# Patient Record
Sex: Female | Born: 1950
Health system: Southern US, Community
[De-identification: ages and names within clinical notes are randomized; demographics above are authoritative.]

## PROBLEM LIST (undated history)

## (undated) DIAGNOSIS — C44721 Squamous cell carcinoma of skin of unspecified lower limb, including hip: Secondary | ICD-10-CM

## (undated) DIAGNOSIS — E119 Type 2 diabetes mellitus without complications: Secondary | ICD-10-CM

## (undated) DIAGNOSIS — R748 Abnormal levels of other serum enzymes: Secondary | ICD-10-CM

## (undated) DIAGNOSIS — I10 Essential (primary) hypertension: Secondary | ICD-10-CM

## (undated) DIAGNOSIS — Z91018 Allergy to other foods: Secondary | ICD-10-CM

## (undated) HISTORY — DX: Allergy to other foods: Z91.018

## (undated) HISTORY — DX: Squamous cell carcinoma of skin of unspecified lower limb, including hip: C44.721

## (undated) HISTORY — PX: BACK SURGERY: SHX140

## (undated) HISTORY — DX: Type 2 diabetes mellitus without complications: E11.9

## (undated) HISTORY — PX: SURGERY OF LIP: SUR1315

## (undated) HISTORY — DX: Essential (primary) hypertension: I10

## (undated) HISTORY — DX: Abnormal levels of other serum enzymes: R74.8

---

## 2001-05-15 ENCOUNTER — Other Ambulatory Visit: Admission: RE | Admit: 2001-05-15 | Discharge: 2001-05-15 | Payer: Self-pay | Admitting: Dermatology

## 2015-11-11 DIAGNOSIS — E1165 Type 2 diabetes mellitus with hyperglycemia: Secondary | ICD-10-CM | POA: Diagnosis not present

## 2015-11-11 DIAGNOSIS — Z6832 Body mass index (BMI) 32.0-32.9, adult: Secondary | ICD-10-CM | POA: Diagnosis not present

## 2015-11-11 DIAGNOSIS — I10 Essential (primary) hypertension: Secondary | ICD-10-CM | POA: Diagnosis not present

## 2016-02-17 DIAGNOSIS — Z1231 Encounter for screening mammogram for malignant neoplasm of breast: Secondary | ICD-10-CM | POA: Diagnosis not present

## 2016-02-17 DIAGNOSIS — Z803 Family history of malignant neoplasm of breast: Secondary | ICD-10-CM | POA: Diagnosis not present

## 2016-02-27 DIAGNOSIS — Z23 Encounter for immunization: Secondary | ICD-10-CM | POA: Diagnosis not present

## 2016-04-13 DIAGNOSIS — E7800 Pure hypercholesterolemia, unspecified: Secondary | ICD-10-CM | POA: Diagnosis not present

## 2016-04-13 DIAGNOSIS — R945 Abnormal results of liver function studies: Secondary | ICD-10-CM | POA: Diagnosis not present

## 2016-04-13 DIAGNOSIS — E1165 Type 2 diabetes mellitus with hyperglycemia: Secondary | ICD-10-CM | POA: Diagnosis not present

## 2016-04-13 DIAGNOSIS — I10 Essential (primary) hypertension: Secondary | ICD-10-CM | POA: Diagnosis not present

## 2016-04-18 DIAGNOSIS — I10 Essential (primary) hypertension: Secondary | ICD-10-CM | POA: Diagnosis not present

## 2016-04-18 DIAGNOSIS — G4709 Other insomnia: Secondary | ICD-10-CM | POA: Diagnosis not present

## 2016-04-18 DIAGNOSIS — E1165 Type 2 diabetes mellitus with hyperglycemia: Secondary | ICD-10-CM | POA: Diagnosis not present

## 2016-04-18 DIAGNOSIS — Z6833 Body mass index (BMI) 33.0-33.9, adult: Secondary | ICD-10-CM | POA: Diagnosis not present

## 2016-04-18 DIAGNOSIS — E7800 Pure hypercholesterolemia, unspecified: Secondary | ICD-10-CM | POA: Diagnosis not present

## 2016-04-30 ENCOUNTER — Encounter (INDEPENDENT_AMBULATORY_CARE_PROVIDER_SITE_OTHER): Payer: Self-pay | Admitting: Internal Medicine

## 2016-05-08 ENCOUNTER — Other Ambulatory Visit (INDEPENDENT_AMBULATORY_CARE_PROVIDER_SITE_OTHER): Payer: Self-pay | Admitting: *Deleted

## 2016-05-08 ENCOUNTER — Encounter (INDEPENDENT_AMBULATORY_CARE_PROVIDER_SITE_OTHER): Payer: Self-pay

## 2016-05-08 ENCOUNTER — Ambulatory Visit (INDEPENDENT_AMBULATORY_CARE_PROVIDER_SITE_OTHER): Payer: Medicare Other | Admitting: Internal Medicine

## 2016-05-08 ENCOUNTER — Encounter (INDEPENDENT_AMBULATORY_CARE_PROVIDER_SITE_OTHER): Payer: Self-pay | Admitting: Internal Medicine

## 2016-05-08 VITALS — BP 150/80 | HR 56 | Temp 98.1°F | Ht 63.5 in | Wt 188.0 lb

## 2016-05-08 DIAGNOSIS — K760 Fatty (change of) liver, not elsewhere classified: Secondary | ICD-10-CM | POA: Diagnosis not present

## 2016-05-08 DIAGNOSIS — R748 Abnormal levels of other serum enzymes: Secondary | ICD-10-CM | POA: Diagnosis not present

## 2016-05-08 DIAGNOSIS — I10 Essential (primary) hypertension: Secondary | ICD-10-CM

## 2016-05-08 DIAGNOSIS — E119 Type 2 diabetes mellitus without complications: Secondary | ICD-10-CM

## 2016-05-08 HISTORY — DX: Essential (primary) hypertension: I10

## 2016-05-08 HISTORY — DX: Type 2 diabetes mellitus without complications: E11.9

## 2016-05-08 NOTE — Patient Instructions (Signed)
OV in 6 months. 

## 2016-05-08 NOTE — Progress Notes (Signed)
   Subjective:    Patient ID: Dorothy Robbins, female    DOB: 09-17-1950, 65 y.o.   MRN: [id number]  HPI Referred by Dr. Nadara Mustard for elevated liver enzymes. She thinks they have been elevated since 2014. No weight loss. She has weight gain Appetite is good. No IV drugs. No tattoos. No blood transfusion.   Hx of pancreatic cancer in a sister with mets to the liver and died at age 18 Mother had fatty liver. She has a niece with elevated liver enzymes. She says father and sister had hepatitis ?   04/13/2016 H and H 14.1 and 42.9 ALP 89 AST 112 ALT 216  04/25/2015 [place] complete (elevated liver enzymes) No Gall stones. Normal CBD. Mild increase echogenicity of the liver suspicious for fatty infiltration. No focal hepatic mass.   Review of Systems Past Medical History:  Diagnosis Date  . Diabetes ([place]) 05/08/2016  . Essential hypertension 05/08/2016    No past surgical history on file.  Allergies  Allergen Reactions  . Sulfa Antibiotics     rash    No current outpatient prescriptions on file prior to visit.   No current facility-administered medications on file prior to visit.        Objective:   Physical Exam Blood pressure (!) 150/80, pulse (!) 56, temperature 98.1 F (36.7 C), height 5' 3.5" (1.613 m), weight 188 lb (85.3 kg). Alert and oriented. Skin warm and dry. Oral mucosa is moist.   . Sclera anicteric, conjunctivae is pink. Thyroid not enlarged. No cervical lymphadenopathy. Lungs clear. Heart regular rate and rhythm.  Abdomen is soft. Bowel sounds are positive         Assessment & Plan:  Elevated liver enzymes/fatty liver. Hepatitis panel, hepatic function, alpha 1 antitrypsin, ceruloplasmin, SMA, ANA, Will need [place] elast once I get all labs back.  OV in 6 months

## 2016-05-09 LAB — HEPATIC FUNCTION PANEL
ALBUMIN: 4.4 g/dL (ref 3.6–5.1)
ALK PHOS: 50 U/L (ref 33–130)
ALT: 165 U/L — AB (ref 6–29)
AST: 101 U/L — AB (ref 10–35)
BILIRUBIN TOTAL: 0.3 mg/dL (ref 0.2–1.2)
Bilirubin, Direct: 0.1 mg/dL (ref <=0.2)
Indirect Bilirubin: 0.2 mg/dL (ref 0.2–1.2)
TOTAL PROTEIN: 7 g/dL (ref 6.1–8.1)

## 2016-05-09 LAB — HEPATITIS PANEL, ACUTE
HCV AB: NEGATIVE
HEP B C IGM: NONREACTIVE
Hep A IgM: NONREACTIVE
Hepatitis B Surface Ag: NEGATIVE

## 2016-05-09 LAB — ANTI-NUCLEAR AB-TITER (ANA TITER)

## 2016-05-09 LAB — SEDIMENTATION RATE: Sed Rate: 4 mm/hr (ref 0–30)

## 2016-05-09 LAB — ANA: Anti Nuclear Antibody(ANA): POSITIVE — AB

## 2016-05-10 ENCOUNTER — Encounter (INDEPENDENT_AMBULATORY_CARE_PROVIDER_SITE_OTHER): Payer: Self-pay

## 2016-05-10 LAB — ALPHA-1-ANTITRYPSIN: A1 ANTITRYPSIN SER: 155 mg/dL (ref 83–199)

## 2016-05-10 LAB — ANTI-SMOOTH MUSCLE ANTIBODY, IGG: Smooth Muscle Ab: 20 U — ABNORMAL HIGH (ref <20)

## 2016-05-10 LAB — CERULOPLASMIN: CERULOPLASMIN: 27 mg/dL (ref 18–53)

## 2016-05-24 ENCOUNTER — Telehealth (INDEPENDENT_AMBULATORY_CARE_PROVIDER_SITE_OTHER): Payer: Self-pay | Admitting: Internal Medicine

## 2016-05-24 ENCOUNTER — Other Ambulatory Visit (INDEPENDENT_AMBULATORY_CARE_PROVIDER_SITE_OTHER): Payer: Self-pay | Admitting: Internal Medicine

## 2016-05-24 DIAGNOSIS — R748 Abnormal levels of other serum enzymes: Secondary | ICD-10-CM

## 2016-05-24 NOTE — Telephone Encounter (Signed)
[place] sch'd 05/30/16 at 10:00 (945), npo after midnight, patient aware

## 2016-05-24 NOTE — Telephone Encounter (Signed)
Dorothy Robbins, US abdomen.  I have spoken with patient

## 2016-05-24 NOTE — Telephone Encounter (Signed)
Dorothy Robbins, US abdomen 

## 2016-05-25 ENCOUNTER — Encounter (INDEPENDENT_AMBULATORY_CARE_PROVIDER_SITE_OTHER): Payer: Self-pay

## 2016-05-28 DIAGNOSIS — R748 Abnormal levels of other serum enzymes: Secondary | ICD-10-CM | POA: Diagnosis not present

## 2016-05-28 LAB — HEPATIC FUNCTION PANEL
ALBUMIN: 4.1 g/dL (ref 3.6–5.1)
ALK PHOS: 56 U/L (ref 33–130)
ALT: 151 U/L — AB (ref 6–29)
AST: 71 U/L — AB (ref 10–35)
BILIRUBIN TOTAL: 0.4 mg/dL (ref 0.2–1.2)
Bilirubin, Direct: 0.1 mg/dL (ref <=0.2)
Indirect Bilirubin: 0.3 mg/dL (ref 0.2–1.2)
Total Protein: 7 g/dL (ref 6.1–8.1)

## 2016-05-29 LAB — IGG: IGG (IMMUNOGLOBIN G), SERUM: 1098 mg/dL (ref 694–1618)

## 2016-05-30 ENCOUNTER — Ambulatory Visit (HOSPITAL_COMMUNITY)
Admission: RE | Admit: 2016-05-30 | Discharge: 2016-05-30 | Disposition: A | Discharge status: Home / Self Care | Payer: Medicare Other | Source: Ambulatory Visit | Attending: Internal Medicine | Admitting: Internal Medicine

## 2016-05-30 DIAGNOSIS — R748 Abnormal levels of other serum enzymes: Secondary | ICD-10-CM | POA: Diagnosis not present

## 2016-05-30 DIAGNOSIS — R945 Abnormal results of liver function studies: Secondary | ICD-10-CM | POA: Diagnosis not present

## 2016-06-04 ENCOUNTER — Other Ambulatory Visit (INDEPENDENT_AMBULATORY_CARE_PROVIDER_SITE_OTHER): Payer: Self-pay | Admitting: *Deleted

## 2016-06-04 DIAGNOSIS — R748 Abnormal levels of other serum enzymes: Secondary | ICD-10-CM

## 2016-06-04 NOTE — Telephone Encounter (Signed)
Patient already has an appointment scheduled for 11/06/16 at 9:00am with Deberah Castle, NP.

## 2016-07-04 DIAGNOSIS — L570 Actinic keratosis: Secondary | ICD-10-CM | POA: Diagnosis not present

## 2016-07-04 DIAGNOSIS — D485 Neoplasm of uncertain behavior of skin: Secondary | ICD-10-CM | POA: Diagnosis not present

## 2016-07-04 DIAGNOSIS — Z85828 Personal history of other malignant neoplasm of skin: Secondary | ICD-10-CM | POA: Diagnosis not present

## 2016-07-04 DIAGNOSIS — D225 Melanocytic nevi of trunk: Secondary | ICD-10-CM | POA: Diagnosis not present

## 2016-07-04 DIAGNOSIS — D0462 Carcinoma in situ of skin of left upper limb, including shoulder: Secondary | ICD-10-CM | POA: Diagnosis not present

## 2016-07-24 DIAGNOSIS — D0462 Carcinoma in situ of skin of left upper limb, including shoulder: Secondary | ICD-10-CM | POA: Diagnosis not present

## 2016-08-09 DIAGNOSIS — L244 Irritant contact dermatitis due to drugs in contact with skin: Secondary | ICD-10-CM | POA: Diagnosis not present

## 2016-08-13 ENCOUNTER — Encounter (INDEPENDENT_AMBULATORY_CARE_PROVIDER_SITE_OTHER): Payer: Self-pay | Admitting: *Deleted

## 2016-08-13 ENCOUNTER — Other Ambulatory Visit (INDEPENDENT_AMBULATORY_CARE_PROVIDER_SITE_OTHER): Payer: Self-pay | Admitting: *Deleted

## 2016-08-13 DIAGNOSIS — R748 Abnormal levels of other serum enzymes: Secondary | ICD-10-CM

## 2016-09-03 DIAGNOSIS — R748 Abnormal levels of other serum enzymes: Secondary | ICD-10-CM | POA: Diagnosis not present

## 2016-09-04 LAB — HEPATIC FUNCTION PANEL
ALBUMIN: 4.5 g/dL (ref 3.6–5.1)
ALT: 127 U/L — ABNORMAL HIGH (ref 6–29)
AST: 79 U/L — ABNORMAL HIGH (ref 10–35)
Alkaline Phosphatase: 45 U/L (ref 33–130)
BILIRUBIN DIRECT: 0.1 mg/dL (ref <=0.2)
BILIRUBIN TOTAL: 0.4 mg/dL (ref 0.2–1.2)
Indirect Bilirubin: 0.3 mg/dL (ref 0.2–1.2)
Total Protein: 7.1 g/dL (ref 6.1–8.1)

## 2016-09-11 DIAGNOSIS — E1165 Type 2 diabetes mellitus with hyperglycemia: Secondary | ICD-10-CM | POA: Diagnosis not present

## 2016-09-11 DIAGNOSIS — I10 Essential (primary) hypertension: Secondary | ICD-10-CM | POA: Diagnosis not present

## 2016-09-11 DIAGNOSIS — R945 Abnormal results of liver function studies: Secondary | ICD-10-CM | POA: Diagnosis not present

## 2016-09-11 DIAGNOSIS — E7800 Pure hypercholesterolemia, unspecified: Secondary | ICD-10-CM | POA: Diagnosis not present

## 2016-09-12 ENCOUNTER — Other Ambulatory Visit (INDEPENDENT_AMBULATORY_CARE_PROVIDER_SITE_OTHER): Payer: Self-pay | Admitting: *Deleted

## 2016-09-12 DIAGNOSIS — R7401 Elevation of levels of liver transaminase levels: Secondary | ICD-10-CM

## 2016-09-12 DIAGNOSIS — R740 Nonspecific elevation of levels of transaminase and lactic acid dehydrogenase [LDH]: Principal | ICD-10-CM

## 2016-09-14 ENCOUNTER — Encounter (INDEPENDENT_AMBULATORY_CARE_PROVIDER_SITE_OTHER): Payer: Self-pay | Admitting: *Deleted

## 2016-09-14 ENCOUNTER — Other Ambulatory Visit (INDEPENDENT_AMBULATORY_CARE_PROVIDER_SITE_OTHER): Payer: Self-pay | Admitting: *Deleted

## 2016-09-14 DIAGNOSIS — R7401 Elevation of levels of liver transaminase levels: Secondary | ICD-10-CM

## 2016-09-14 DIAGNOSIS — E7800 Pure hypercholesterolemia, unspecified: Secondary | ICD-10-CM | POA: Diagnosis not present

## 2016-09-14 DIAGNOSIS — R740 Nonspecific elevation of levels of transaminase and lactic acid dehydrogenase [LDH]: Principal | ICD-10-CM

## 2016-09-14 DIAGNOSIS — E1165 Type 2 diabetes mellitus with hyperglycemia: Secondary | ICD-10-CM | POA: Diagnosis not present

## 2016-09-14 DIAGNOSIS — Z0000 Encounter for general adult medical examination without abnormal findings: Secondary | ICD-10-CM | POA: Diagnosis not present

## 2016-09-14 DIAGNOSIS — I10 Essential (primary) hypertension: Secondary | ICD-10-CM | POA: Diagnosis not present

## 2016-10-02 DIAGNOSIS — L570 Actinic keratosis: Secondary | ICD-10-CM | POA: Diagnosis not present

## 2016-10-22 DIAGNOSIS — R740 Nonspecific elevation of levels of transaminase and lactic acid dehydrogenase [LDH]: Secondary | ICD-10-CM | POA: Diagnosis not present

## 2016-10-22 LAB — HEPATIC FUNCTION PANEL
ALBUMIN: 4.5 g/dL (ref 3.6–5.1)
ALT: 50 U/L — ABNORMAL HIGH (ref 6–29)
AST: 38 U/L — ABNORMAL HIGH (ref 10–35)
Alkaline Phosphatase: 48 U/L (ref 33–130)
BILIRUBIN DIRECT: 0.1 mg/dL (ref <=0.2)
BILIRUBIN TOTAL: 0.5 mg/dL (ref 0.2–1.2)
Indirect Bilirubin: 0.4 mg/dL (ref 0.2–1.2)
Total Protein: 7.3 g/dL (ref 6.1–8.1)

## 2016-10-25 DIAGNOSIS — Z01419 Encounter for gynecological examination (general) (routine) without abnormal findings: Secondary | ICD-10-CM | POA: Diagnosis not present

## 2016-10-25 DIAGNOSIS — Z6829 Body mass index (BMI) 29.0-29.9, adult: Secondary | ICD-10-CM | POA: Diagnosis not present

## 2016-10-25 DIAGNOSIS — N950 Postmenopausal bleeding: Secondary | ICD-10-CM | POA: Diagnosis not present

## 2016-11-06 ENCOUNTER — Encounter (INDEPENDENT_AMBULATORY_CARE_PROVIDER_SITE_OTHER): Payer: Self-pay | Admitting: Internal Medicine

## 2016-11-06 ENCOUNTER — Ambulatory Visit (INDEPENDENT_AMBULATORY_CARE_PROVIDER_SITE_OTHER): Payer: Medicare Other | Admitting: Internal Medicine

## 2016-11-06 VITALS — BP 142/84 | HR 65 | Temp 98.4°F | Ht 63.5 in | Wt 177.1 lb

## 2016-11-06 DIAGNOSIS — R748 Abnormal levels of other serum enzymes: Secondary | ICD-10-CM | POA: Diagnosis not present

## 2016-11-06 HISTORY — DX: Abnormal levels of other serum enzymes: R74.8

## 2016-11-06 LAB — HEPATIC FUNCTION PANEL
ALK PHOS: 54 U/L (ref 33–130)
ALT: 43 U/L — ABNORMAL HIGH (ref 6–29)
AST: 28 U/L (ref 10–35)
Albumin: 4.6 g/dL (ref 3.6–5.1)
BILIRUBIN TOTAL: 0.3 mg/dL (ref 0.2–1.2)
Bilirubin, Direct: 0.1 mg/dL (ref <=0.2)
Indirect Bilirubin: 0.2 mg/dL (ref 0.2–1.2)
Total Protein: 7.2 g/dL (ref 6.1–8.1)

## 2016-11-06 LAB — FERRITIN: FERRITIN: 136 ng/mL (ref 20–288)

## 2016-11-06 NOTE — Progress Notes (Signed)
Subjective:    Patient ID: Dorothy Robbins, female    DOB: [birth year], 66 y.o.   MRN: [id number]  HPI Here today for f/u. Last seen in December of 2017.for elevated liver enzymes. No prior hx of IV drugs, tattoos, blood transfusion. Liver enzymes elevated since 2015 per patient.   Hx of pancreatic cancer in a sister with mets to the liver and died at age 23 Mother had fatty liver. She has a niece with elevated liver enzymes. She says father and sister had hepatitis ? Appetite is good. She has lost 11 pounds since her last visit which was intentional. BMs are normal. She feels good.  Nose burning _0 /24/2017 H and H 14.1 and 42.9 ALP 89 AST 112 ALT 216  05/30/2016 US abdomen complete (elevated liver enzymes:    IMPRESSION: Increased echotexture throughout the liver compatible with fatty infiltration.  Small amount of sludge within the gallbladder. No stones or changes of cholecystitis.    04/25/2015 [place] complete (elevated liver enzymes) No Gall stones. Normal CBD. Mild increase echogenicity of the liver suspicious for fatty infiltration. No focal hepatic mass.     Review of Systems Past Medical History:  Diagnosis Date  . Diabetes ([place]) 05/08/2016  . Elevated liver enzymes 11/06/2016  . Essential hypertension 05/08/2016    No past surgical history on file.  Allergies  Allergen Reactions  . Sulfa Antibiotics     rash     Current Outpatient Prescriptions on File Prior to Visit  Medication Sig Dispense Refill  . aspirin 81 MG chewable tablet Chew by mouth daily.    Marland Kitchen lisinopril-hydrochlorothiazide (PRINZIDE,ZESTORETIC) 20-25 MG tablet Take 1 tablet by mouth daily.    . metFORMIN (GLUCOPHAGE-XR) 500 MG 24 hr tablet Take 500 mg by mouth daily with breakfast.    . Multiple Vitamin (MULTIVITAMIN) tablet Take 1 tablet by mouth daily.    Marland Kitchen zolpidem (AMBIEN) 10 MG tablet Take 10 mg by mouth at bedtime as needed for sleep.     No current facility-administered medications on file prior to visit.         Objective:   Physical Exam Blood pressure (!) 142/84, pulse 65, temperature 98.4 F (36.9 C), height 5' 3.5" (1.613 m), weight 177 lb 1.6 oz (80.3 kg). Alert and oriented. Skin warm and dry. Oral mucosa is moist.   . Sclera anicteric, conjunctivae is pink. Thyroid not enlarged. No cervical lymphadenopathy. Lungs clear. Heart regular rate and rhythm.  Abdomen is soft. Bowel sounds are positive. No hepatomegaly. No abdominal masses felt. No tenderness.  No edema to lower extremities.          Assessment & Plan:  Elevated liver enzymes. Number are coming down. She has lost 11 pounds since her lat OV in December.  Repeat hepatic function. IGG, ferritin. OV in 6  months.

## 2016-11-06 NOTE — Patient Instructions (Signed)
OV in 6 months. 

## 2016-11-07 LAB — IGG: IGG (IMMUNOGLOBIN G), SERUM: 1075 mg/dL (ref 694–1618)

## 2016-11-08 DIAGNOSIS — N858 Other specified noninflammatory disorders of uterus: Secondary | ICD-10-CM | POA: Diagnosis not present

## 2016-11-08 DIAGNOSIS — D250 Submucous leiomyoma of uterus: Secondary | ICD-10-CM | POA: Diagnosis not present

## 2016-11-08 DIAGNOSIS — Z6829 Body mass index (BMI) 29.0-29.9, adult: Secondary | ICD-10-CM | POA: Diagnosis not present

## 2016-11-08 DIAGNOSIS — N950 Postmenopausal bleeding: Secondary | ICD-10-CM | POA: Diagnosis not present

## 2016-12-19 ENCOUNTER — Encounter (INDEPENDENT_AMBULATORY_CARE_PROVIDER_SITE_OTHER): Payer: Self-pay | Admitting: *Deleted

## 2017-01-29 DIAGNOSIS — Z23 Encounter for immunization: Secondary | ICD-10-CM | POA: Diagnosis not present

## 2017-02-26 DIAGNOSIS — Z23 Encounter for immunization: Secondary | ICD-10-CM | POA: Diagnosis not present

## 2017-02-26 DIAGNOSIS — L570 Actinic keratosis: Secondary | ICD-10-CM | POA: Diagnosis not present

## 2017-02-26 DIAGNOSIS — L821 Other seborrheic keratosis: Secondary | ICD-10-CM | POA: Diagnosis not present

## 2017-03-11 DIAGNOSIS — I10 Essential (primary) hypertension: Secondary | ICD-10-CM | POA: Diagnosis not present

## 2017-03-11 DIAGNOSIS — E1165 Type 2 diabetes mellitus with hyperglycemia: Secondary | ICD-10-CM | POA: Diagnosis not present

## 2017-03-11 DIAGNOSIS — Z6830 Body mass index (BMI) 30.0-30.9, adult: Secondary | ICD-10-CM | POA: Diagnosis not present

## 2017-03-11 DIAGNOSIS — E7800 Pure hypercholesterolemia, unspecified: Secondary | ICD-10-CM | POA: Diagnosis not present

## 2017-05-08 ENCOUNTER — Ambulatory Visit (INDEPENDENT_AMBULATORY_CARE_PROVIDER_SITE_OTHER): Payer: Medicare Other | Admitting: Internal Medicine

## 2017-07-05 DIAGNOSIS — L723 Sebaceous cyst: Secondary | ICD-10-CM | POA: Diagnosis not present

## 2017-07-05 DIAGNOSIS — Z23 Encounter for immunization: Secondary | ICD-10-CM | POA: Diagnosis not present

## 2017-07-05 DIAGNOSIS — Z85828 Personal history of other malignant neoplasm of skin: Secondary | ICD-10-CM | POA: Diagnosis not present

## 2017-07-05 DIAGNOSIS — L570 Actinic keratosis: Secondary | ICD-10-CM | POA: Diagnosis not present

## 2017-07-26 IMAGING — US US ABDOMEN COMPLETE
1 series · 14 of 25 positions shown · non-contrast
Comparison: None.

CLINICAL DATA: Elevated liver enzymes

EXAM:
ABDOMEN ULTRASOUND COMPLETE

[Series 1: us abdomen complete · 0.25mm/px · 14 of 106 slices shown]
[im 1/106]
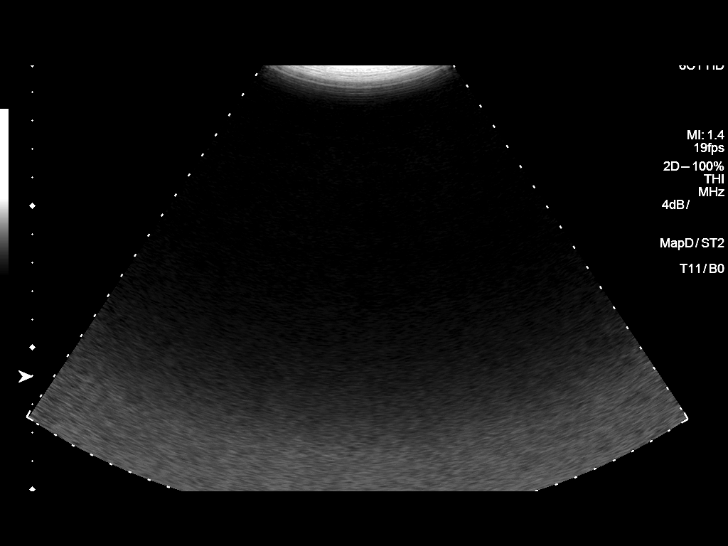
[im 9/106]
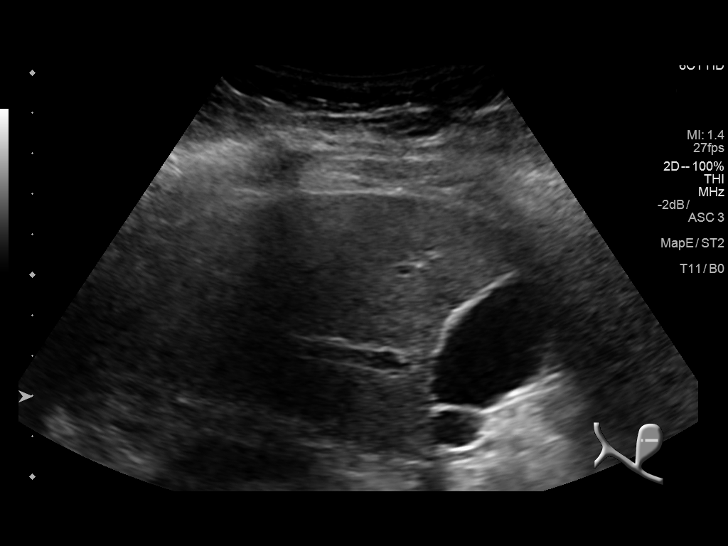
[im 18/106]
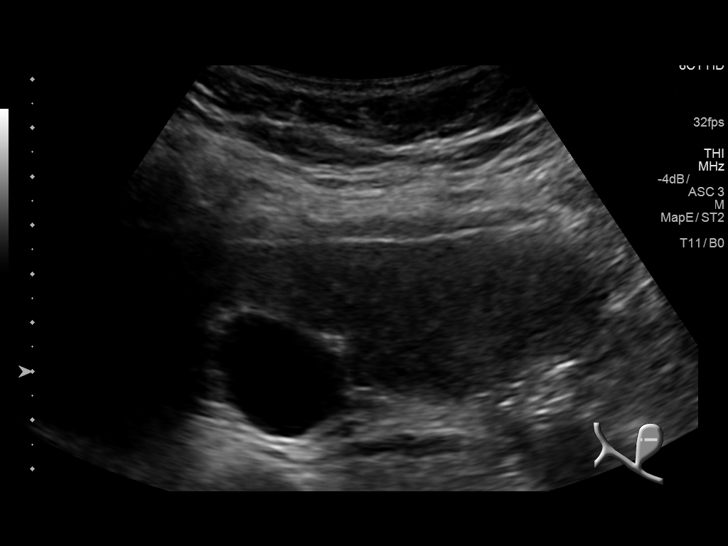
[im 27/106]
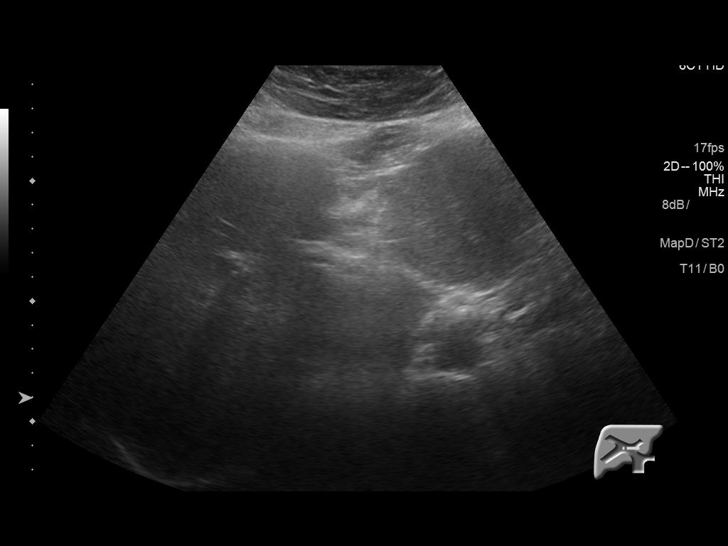
[im 36/106]
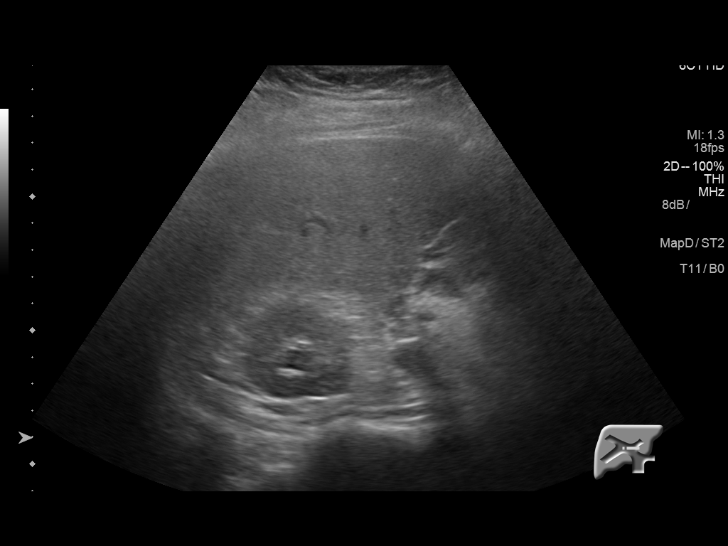
[im 40/106]
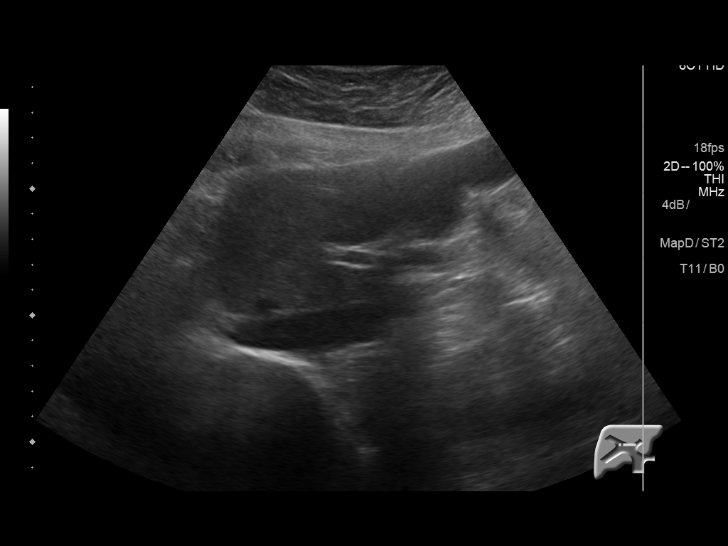
[im 49/106]
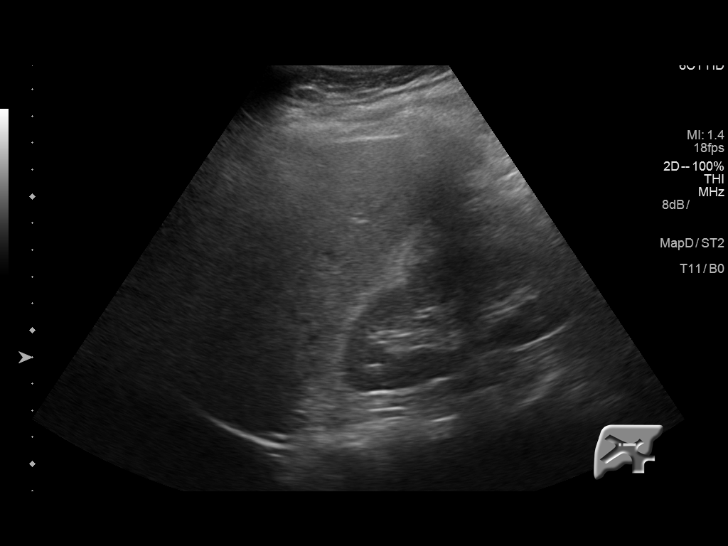
[im 57/106]
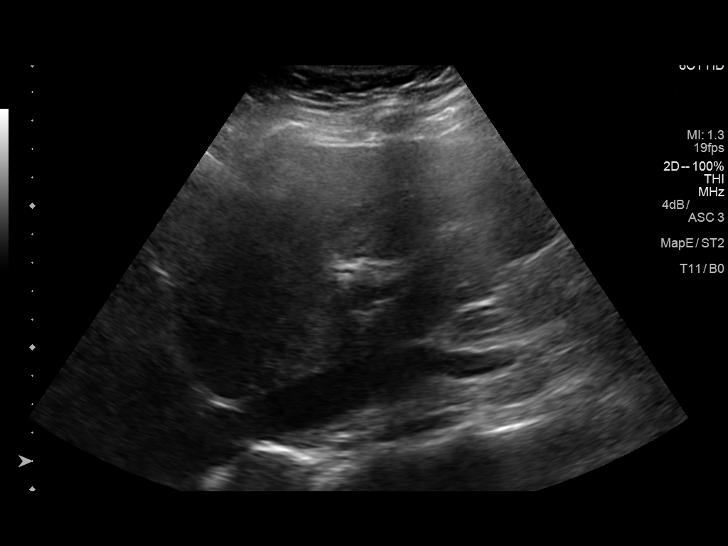
[im 66/106]
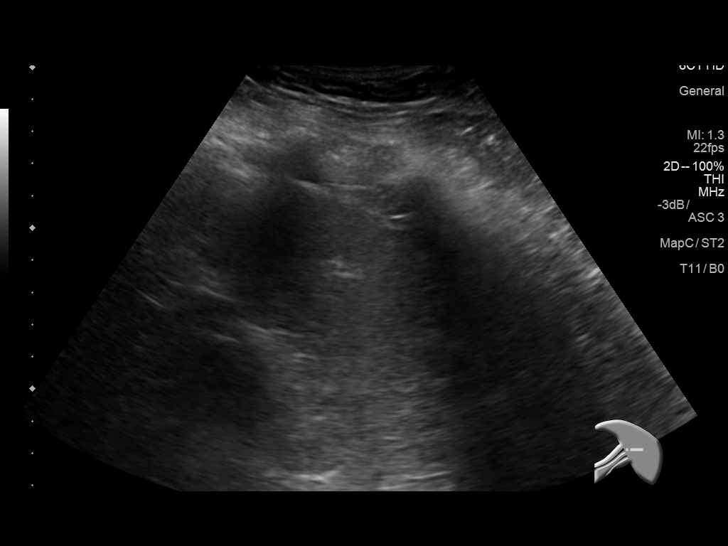
[im 71/106]
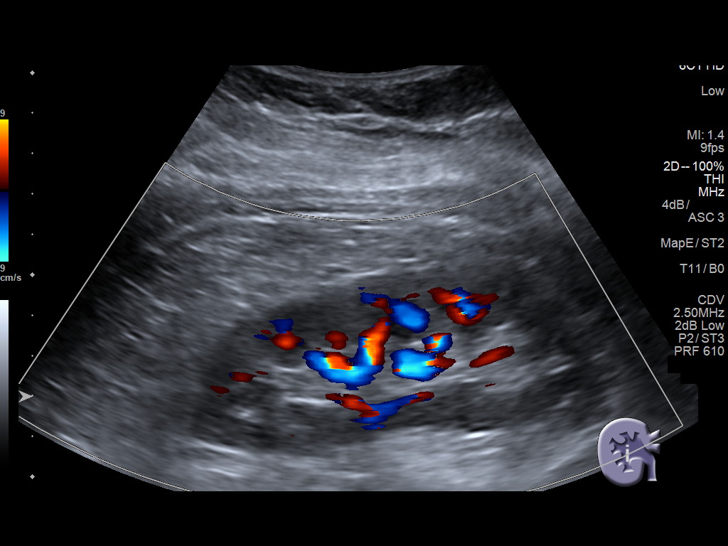
[im 79/106]
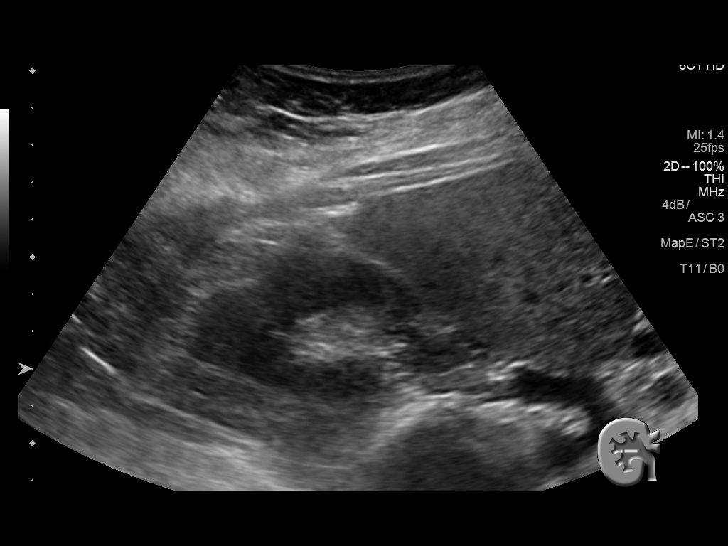
[im 88/106]
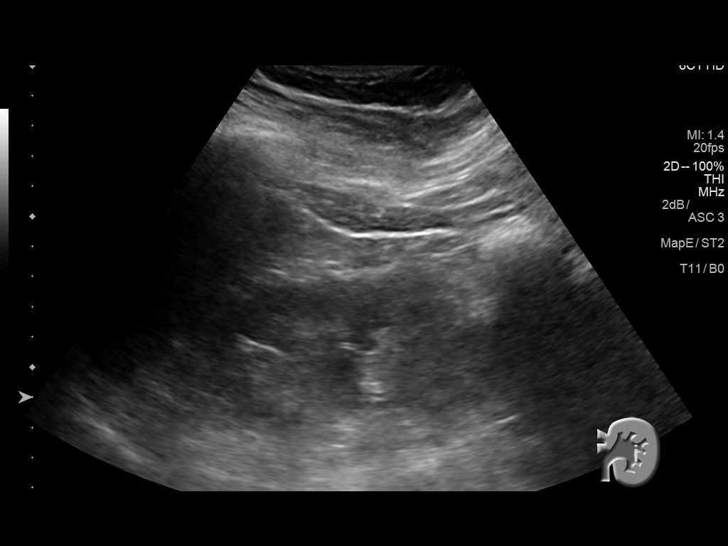
[im 97/106]
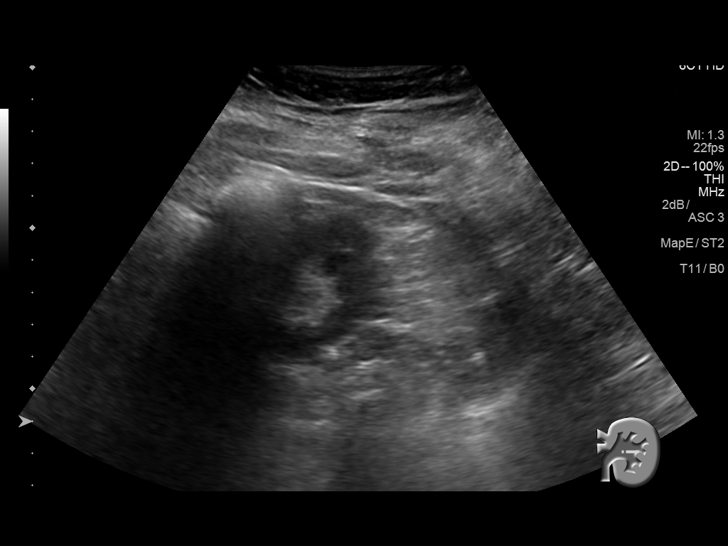
[im 106/106]
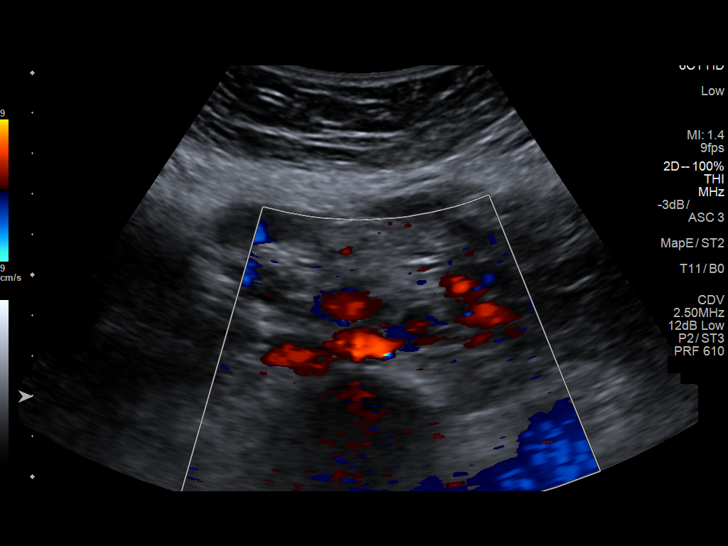

[14 of 25 positions shown; findings below may reference images not displayed]

FINDINGS: Gallbladder: Small amount of sludge seen within the gallbladder. No
stones or wall thickening.

Common bile duct: Diameter: Normal caliber, 1 mm

Liver: Increased echotexture compatible with fatty infiltration. No
focal abnormality or biliary ductal dilatation.

IVC: No abnormality visualized.

Pancreas: Visualized portion unremarkable.

Spleen: Size and appearance within normal limits.

Right Kidney: Length: 10.9 cm. Echogenicity within normal limits. No
mass or hydronephrosis visualized.

Left Kidney: Length: 12.5 cm. Echogenicity within normal limits. No
mass or hydronephrosis visualized.

Abdominal aorta: No aneurysm visualized.

Other findings: None.
IMPRESSION: Increased echotexture throughout the liver compatible with fatty
infiltration.

Small amount of sludge within the gallbladder. No stones or changes
of cholecystitis.

## 2017-09-02 DIAGNOSIS — Z6831 Body mass index (BMI) 31.0-31.9, adult: Secondary | ICD-10-CM | POA: Diagnosis not present

## 2017-09-02 DIAGNOSIS — J301 Allergic rhinitis due to pollen: Secondary | ICD-10-CM | POA: Diagnosis not present

## 2017-09-02 DIAGNOSIS — R05 Cough: Secondary | ICD-10-CM | POA: Diagnosis not present

## 2017-09-19 DIAGNOSIS — J029 Acute pharyngitis, unspecified: Secondary | ICD-10-CM | POA: Diagnosis not present

## 2017-09-19 DIAGNOSIS — J301 Allergic rhinitis due to pollen: Secondary | ICD-10-CM | POA: Diagnosis not present

## 2017-09-19 DIAGNOSIS — Z681 Body mass index (BMI) 19 or less, adult: Secondary | ICD-10-CM | POA: Diagnosis not present

## 2017-09-19 DIAGNOSIS — R05 Cough: Secondary | ICD-10-CM | POA: Diagnosis not present

## 2017-09-23 DIAGNOSIS — R945 Abnormal results of liver function studies: Secondary | ICD-10-CM | POA: Diagnosis not present

## 2017-09-23 DIAGNOSIS — E1165 Type 2 diabetes mellitus with hyperglycemia: Secondary | ICD-10-CM | POA: Diagnosis not present

## 2017-09-23 DIAGNOSIS — E7800 Pure hypercholesterolemia, unspecified: Secondary | ICD-10-CM | POA: Diagnosis not present

## 2017-09-23 DIAGNOSIS — I10 Essential (primary) hypertension: Secondary | ICD-10-CM | POA: Diagnosis not present

## 2017-09-27 DIAGNOSIS — E7800 Pure hypercholesterolemia, unspecified: Secondary | ICD-10-CM | POA: Diagnosis not present

## 2017-09-27 DIAGNOSIS — Z0000 Encounter for general adult medical examination without abnormal findings: Secondary | ICD-10-CM | POA: Diagnosis not present

## 2017-09-27 DIAGNOSIS — I10 Essential (primary) hypertension: Secondary | ICD-10-CM | POA: Diagnosis not present

## 2017-09-27 DIAGNOSIS — G4709 Other insomnia: Secondary | ICD-10-CM | POA: Diagnosis not present

## 2017-10-03 DIAGNOSIS — E042 Nontoxic multinodular goiter: Secondary | ICD-10-CM | POA: Diagnosis not present

## 2017-10-04 DIAGNOSIS — L570 Actinic keratosis: Secondary | ICD-10-CM | POA: Diagnosis not present

## 2017-10-04 DIAGNOSIS — S90222A Contusion of left lesser toe(s) with damage to nail, initial encounter: Secondary | ICD-10-CM | POA: Diagnosis not present

## 2017-10-04 DIAGNOSIS — L821 Other seborrheic keratosis: Secondary | ICD-10-CM | POA: Diagnosis not present

## 2017-10-17 DIAGNOSIS — E041 Nontoxic single thyroid nodule: Secondary | ICD-10-CM | POA: Diagnosis not present

## 2017-10-17 DIAGNOSIS — E079 Disorder of thyroid, unspecified: Secondary | ICD-10-CM | POA: Diagnosis not present

## 2017-11-04 DIAGNOSIS — Z780 Asymptomatic menopausal state: Secondary | ICD-10-CM | POA: Diagnosis not present

## 2017-11-04 DIAGNOSIS — Z1231 Encounter for screening mammogram for malignant neoplasm of breast: Secondary | ICD-10-CM | POA: Diagnosis not present

## 2017-11-11 ENCOUNTER — Ambulatory Visit (INDEPENDENT_AMBULATORY_CARE_PROVIDER_SITE_OTHER): Payer: Medicare Other | Admitting: Otolaryngology

## 2017-11-11 DIAGNOSIS — D440 Neoplasm of uncertain behavior of thyroid gland: Secondary | ICD-10-CM

## 2017-11-28 DIAGNOSIS — L247 Irritant contact dermatitis due to plants, except food: Secondary | ICD-10-CM | POA: Diagnosis not present

## 2017-11-28 DIAGNOSIS — Z6832 Body mass index (BMI) 32.0-32.9, adult: Secondary | ICD-10-CM | POA: Diagnosis not present

## 2017-12-11 DIAGNOSIS — M545 Low back pain: Secondary | ICD-10-CM | POA: Diagnosis not present

## 2017-12-11 DIAGNOSIS — M47816 Spondylosis without myelopathy or radiculopathy, lumbar region: Secondary | ICD-10-CM | POA: Diagnosis not present

## 2017-12-11 DIAGNOSIS — Z6831 Body mass index (BMI) 31.0-31.9, adult: Secondary | ICD-10-CM | POA: Diagnosis not present

## 2017-12-11 DIAGNOSIS — M533 Sacrococcygeal disorders, not elsewhere classified: Secondary | ICD-10-CM | POA: Diagnosis not present

## 2017-12-23 DIAGNOSIS — M4310 Spondylolisthesis, site unspecified: Secondary | ICD-10-CM | POA: Diagnosis not present

## 2017-12-23 DIAGNOSIS — Z6831 Body mass index (BMI) 31.0-31.9, adult: Secondary | ICD-10-CM | POA: Diagnosis not present

## 2017-12-23 DIAGNOSIS — M533 Sacrococcygeal disorders, not elsewhere classified: Secondary | ICD-10-CM | POA: Diagnosis not present

## 2017-12-23 DIAGNOSIS — M47817 Spondylosis without myelopathy or radiculopathy, lumbosacral region: Secondary | ICD-10-CM | POA: Diagnosis not present

## 2018-01-01 DIAGNOSIS — Z6831 Body mass index (BMI) 31.0-31.9, adult: Secondary | ICD-10-CM | POA: Diagnosis not present

## 2018-01-01 DIAGNOSIS — L03012 Cellulitis of left finger: Secondary | ICD-10-CM | POA: Diagnosis not present

## 2018-02-11 DIAGNOSIS — Z6831 Body mass index (BMI) 31.0-31.9, adult: Secondary | ICD-10-CM | POA: Diagnosis not present

## 2018-02-11 DIAGNOSIS — M545 Low back pain: Secondary | ICD-10-CM | POA: Diagnosis not present

## 2018-02-20 DIAGNOSIS — Z23 Encounter for immunization: Secondary | ICD-10-CM | POA: Diagnosis not present

## 2018-03-24 DIAGNOSIS — M545 Low back pain: Secondary | ICD-10-CM | POA: Diagnosis not present

## 2018-03-24 DIAGNOSIS — M5416 Radiculopathy, lumbar region: Secondary | ICD-10-CM | POA: Diagnosis not present

## 2018-03-24 DIAGNOSIS — R262 Difficulty in walking, not elsewhere classified: Secondary | ICD-10-CM | POA: Diagnosis not present

## 2018-03-26 DIAGNOSIS — R946 Abnormal results of thyroid function studies: Secondary | ICD-10-CM | POA: Diagnosis not present

## 2018-03-26 DIAGNOSIS — E041 Nontoxic single thyroid nodule: Secondary | ICD-10-CM | POA: Diagnosis not present

## 2018-03-27 DIAGNOSIS — M5416 Radiculopathy, lumbar region: Secondary | ICD-10-CM | POA: Diagnosis not present

## 2018-03-27 DIAGNOSIS — R262 Difficulty in walking, not elsewhere classified: Secondary | ICD-10-CM | POA: Diagnosis not present

## 2018-03-27 DIAGNOSIS — M545 Low back pain: Secondary | ICD-10-CM | POA: Diagnosis not present

## 2018-03-31 DIAGNOSIS — M5416 Radiculopathy, lumbar region: Secondary | ICD-10-CM | POA: Diagnosis not present

## 2018-03-31 DIAGNOSIS — R262 Difficulty in walking, not elsewhere classified: Secondary | ICD-10-CM | POA: Diagnosis not present

## 2018-03-31 DIAGNOSIS — M545 Low back pain: Secondary | ICD-10-CM | POA: Diagnosis not present

## 2018-04-03 DIAGNOSIS — M545 Low back pain: Secondary | ICD-10-CM | POA: Diagnosis not present

## 2018-04-03 DIAGNOSIS — R262 Difficulty in walking, not elsewhere classified: Secondary | ICD-10-CM | POA: Diagnosis not present

## 2018-04-03 DIAGNOSIS — M5416 Radiculopathy, lumbar region: Secondary | ICD-10-CM | POA: Diagnosis not present

## 2018-04-07 DIAGNOSIS — M545 Low back pain: Secondary | ICD-10-CM | POA: Diagnosis not present

## 2018-04-07 DIAGNOSIS — R262 Difficulty in walking, not elsewhere classified: Secondary | ICD-10-CM | POA: Diagnosis not present

## 2018-04-07 DIAGNOSIS — M5416 Radiculopathy, lumbar region: Secondary | ICD-10-CM | POA: Diagnosis not present

## 2018-04-10 DIAGNOSIS — M545 Low back pain: Secondary | ICD-10-CM | POA: Diagnosis not present

## 2018-04-10 DIAGNOSIS — M5416 Radiculopathy, lumbar region: Secondary | ICD-10-CM | POA: Diagnosis not present

## 2018-04-10 DIAGNOSIS — Z6831 Body mass index (BMI) 31.0-31.9, adult: Secondary | ICD-10-CM | POA: Diagnosis not present

## 2018-04-10 DIAGNOSIS — R262 Difficulty in walking, not elsewhere classified: Secondary | ICD-10-CM | POA: Diagnosis not present

## 2018-04-15 DIAGNOSIS — R262 Difficulty in walking, not elsewhere classified: Secondary | ICD-10-CM | POA: Diagnosis not present

## 2018-04-15 DIAGNOSIS — M545 Low back pain: Secondary | ICD-10-CM | POA: Diagnosis not present

## 2018-04-15 DIAGNOSIS — M5416 Radiculopathy, lumbar region: Secondary | ICD-10-CM | POA: Diagnosis not present

## 2018-04-21 DIAGNOSIS — R262 Difficulty in walking, not elsewhere classified: Secondary | ICD-10-CM | POA: Diagnosis not present

## 2018-04-21 DIAGNOSIS — M5416 Radiculopathy, lumbar region: Secondary | ICD-10-CM | POA: Diagnosis not present

## 2018-05-01 DIAGNOSIS — M48061 Spinal stenosis, lumbar region without neurogenic claudication: Secondary | ICD-10-CM | POA: Diagnosis not present

## 2018-05-01 DIAGNOSIS — M4316 Spondylolisthesis, lumbar region: Secondary | ICD-10-CM | POA: Diagnosis not present

## 2018-05-01 DIAGNOSIS — M5126 Other intervertebral disc displacement, lumbar region: Secondary | ICD-10-CM | POA: Diagnosis not present

## 2018-05-01 DIAGNOSIS — M545 Low back pain: Secondary | ICD-10-CM | POA: Diagnosis not present

## 2018-05-01 DIAGNOSIS — M5136 Other intervertebral disc degeneration, lumbar region: Secondary | ICD-10-CM | POA: Diagnosis not present

## 2018-05-01 DIAGNOSIS — M47816 Spondylosis without myelopathy or radiculopathy, lumbar region: Secondary | ICD-10-CM | POA: Diagnosis not present

## 2018-05-19 DIAGNOSIS — Z6832 Body mass index (BMI) 32.0-32.9, adult: Secondary | ICD-10-CM | POA: Diagnosis not present

## 2018-05-19 DIAGNOSIS — M48062 Spinal stenosis, lumbar region with neurogenic claudication: Secondary | ICD-10-CM | POA: Diagnosis not present

## 2018-05-19 DIAGNOSIS — I10 Essential (primary) hypertension: Secondary | ICD-10-CM | POA: Diagnosis not present

## 2018-05-19 DIAGNOSIS — M4316 Spondylolisthesis, lumbar region: Secondary | ICD-10-CM | POA: Diagnosis not present

## 2018-05-24 DIAGNOSIS — M7551 Bursitis of right shoulder: Secondary | ICD-10-CM | POA: Diagnosis not present

## 2018-05-24 DIAGNOSIS — M79601 Pain in right arm: Secondary | ICD-10-CM | POA: Diagnosis not present

## 2018-05-24 DIAGNOSIS — Z6832 Body mass index (BMI) 32.0-32.9, adult: Secondary | ICD-10-CM | POA: Diagnosis not present

## 2018-05-26 DIAGNOSIS — Z6832 Body mass index (BMI) 32.0-32.9, adult: Secondary | ICD-10-CM | POA: Diagnosis not present

## 2018-05-26 DIAGNOSIS — M7551 Bursitis of right shoulder: Secondary | ICD-10-CM | POA: Diagnosis not present

## 2018-05-26 DIAGNOSIS — M79601 Pain in right arm: Secondary | ICD-10-CM | POA: Diagnosis not present

## 2018-06-06 DIAGNOSIS — M25511 Pain in right shoulder: Secondary | ICD-10-CM | POA: Diagnosis not present

## 2018-06-12 DIAGNOSIS — M7581 Other shoulder lesions, right shoulder: Secondary | ICD-10-CM | POA: Diagnosis not present

## 2018-06-12 DIAGNOSIS — M19011 Primary osteoarthritis, right shoulder: Secondary | ICD-10-CM | POA: Diagnosis not present

## 2018-06-12 DIAGNOSIS — M25511 Pain in right shoulder: Secondary | ICD-10-CM | POA: Diagnosis not present

## 2018-06-13 DIAGNOSIS — M25511 Pain in right shoulder: Secondary | ICD-10-CM | POA: Diagnosis not present

## 2018-06-18 DIAGNOSIS — M7541 Impingement syndrome of right shoulder: Secondary | ICD-10-CM | POA: Diagnosis not present

## 2018-06-20 DIAGNOSIS — M7541 Impingement syndrome of right shoulder: Secondary | ICD-10-CM | POA: Diagnosis not present

## 2018-06-23 DIAGNOSIS — M545 Low back pain: Secondary | ICD-10-CM | POA: Diagnosis not present

## 2018-06-27 DIAGNOSIS — M7541 Impingement syndrome of right shoulder: Secondary | ICD-10-CM | POA: Diagnosis not present

## 2018-06-30 DIAGNOSIS — M7541 Impingement syndrome of right shoulder: Secondary | ICD-10-CM | POA: Diagnosis not present

## 2018-07-04 DIAGNOSIS — M7541 Impingement syndrome of right shoulder: Secondary | ICD-10-CM | POA: Diagnosis not present

## 2018-07-09 DIAGNOSIS — Z0120 Encounter for dental examination and cleaning without abnormal findings: Secondary | ICD-10-CM | POA: Diagnosis not present

## 2018-07-11 DIAGNOSIS — M7541 Impingement syndrome of right shoulder: Secondary | ICD-10-CM | POA: Diagnosis not present

## 2018-07-14 DIAGNOSIS — M4316 Spondylolisthesis, lumbar region: Secondary | ICD-10-CM | POA: Diagnosis not present

## 2018-07-14 DIAGNOSIS — M48062 Spinal stenosis, lumbar region with neurogenic claudication: Secondary | ICD-10-CM | POA: Diagnosis not present

## 2018-07-14 DIAGNOSIS — G5601 Carpal tunnel syndrome, right upper limb: Secondary | ICD-10-CM | POA: Diagnosis not present

## 2018-07-15 DIAGNOSIS — M7541 Impingement syndrome of right shoulder: Secondary | ICD-10-CM | POA: Diagnosis not present

## 2018-07-18 DIAGNOSIS — G5601 Carpal tunnel syndrome, right upper limb: Secondary | ICD-10-CM | POA: Diagnosis not present

## 2018-07-21 DIAGNOSIS — M7541 Impingement syndrome of right shoulder: Secondary | ICD-10-CM | POA: Diagnosis not present

## 2018-07-22 DIAGNOSIS — L570 Actinic keratosis: Secondary | ICD-10-CM | POA: Diagnosis not present

## 2018-07-22 DIAGNOSIS — Z85828 Personal history of other malignant neoplasm of skin: Secondary | ICD-10-CM | POA: Diagnosis not present

## 2018-07-22 DIAGNOSIS — Z23 Encounter for immunization: Secondary | ICD-10-CM | POA: Diagnosis not present

## 2018-07-22 DIAGNOSIS — D225 Melanocytic nevi of trunk: Secondary | ICD-10-CM | POA: Diagnosis not present

## 2018-08-01 DIAGNOSIS — Z6832 Body mass index (BMI) 32.0-32.9, adult: Secondary | ICD-10-CM | POA: Diagnosis not present

## 2018-08-01 DIAGNOSIS — J019 Acute sinusitis, unspecified: Secondary | ICD-10-CM | POA: Diagnosis not present

## 2018-08-20 DIAGNOSIS — M5442 Lumbago with sciatica, left side: Secondary | ICD-10-CM | POA: Diagnosis not present

## 2018-08-20 DIAGNOSIS — M9902 Segmental and somatic dysfunction of thoracic region: Secondary | ICD-10-CM | POA: Diagnosis not present

## 2018-08-20 DIAGNOSIS — M9903 Segmental and somatic dysfunction of lumbar region: Secondary | ICD-10-CM | POA: Diagnosis not present

## 2018-08-20 DIAGNOSIS — M9905 Segmental and somatic dysfunction of pelvic region: Secondary | ICD-10-CM | POA: Diagnosis not present

## 2018-08-22 DIAGNOSIS — M9903 Segmental and somatic dysfunction of lumbar region: Secondary | ICD-10-CM | POA: Diagnosis not present

## 2018-08-22 DIAGNOSIS — M9902 Segmental and somatic dysfunction of thoracic region: Secondary | ICD-10-CM | POA: Diagnosis not present

## 2018-08-22 DIAGNOSIS — M5442 Lumbago with sciatica, left side: Secondary | ICD-10-CM | POA: Diagnosis not present

## 2018-08-22 DIAGNOSIS — M9905 Segmental and somatic dysfunction of pelvic region: Secondary | ICD-10-CM | POA: Diagnosis not present

## 2018-08-25 DIAGNOSIS — M9903 Segmental and somatic dysfunction of lumbar region: Secondary | ICD-10-CM | POA: Diagnosis not present

## 2018-08-25 DIAGNOSIS — M9902 Segmental and somatic dysfunction of thoracic region: Secondary | ICD-10-CM | POA: Diagnosis not present

## 2018-08-25 DIAGNOSIS — M9905 Segmental and somatic dysfunction of pelvic region: Secondary | ICD-10-CM | POA: Diagnosis not present

## 2018-08-25 DIAGNOSIS — M5442 Lumbago with sciatica, left side: Secondary | ICD-10-CM | POA: Diagnosis not present

## 2018-08-27 DIAGNOSIS — M5442 Lumbago with sciatica, left side: Secondary | ICD-10-CM | POA: Diagnosis not present

## 2018-08-27 DIAGNOSIS — M9905 Segmental and somatic dysfunction of pelvic region: Secondary | ICD-10-CM | POA: Diagnosis not present

## 2018-08-27 DIAGNOSIS — M9903 Segmental and somatic dysfunction of lumbar region: Secondary | ICD-10-CM | POA: Diagnosis not present

## 2018-08-27 DIAGNOSIS — M9902 Segmental and somatic dysfunction of thoracic region: Secondary | ICD-10-CM | POA: Diagnosis not present

## 2018-09-02 DIAGNOSIS — M545 Low back pain: Secondary | ICD-10-CM | POA: Diagnosis not present

## 2018-09-22 DIAGNOSIS — M4316 Spondylolisthesis, lumbar region: Secondary | ICD-10-CM | POA: Diagnosis not present

## 2018-09-23 DIAGNOSIS — E1165 Type 2 diabetes mellitus with hyperglycemia: Secondary | ICD-10-CM | POA: Diagnosis not present

## 2018-09-23 DIAGNOSIS — E7800 Pure hypercholesterolemia, unspecified: Secondary | ICD-10-CM | POA: Diagnosis not present

## 2018-09-23 DIAGNOSIS — I10 Essential (primary) hypertension: Secondary | ICD-10-CM | POA: Diagnosis not present

## 2018-09-23 DIAGNOSIS — R945 Abnormal results of liver function studies: Secondary | ICD-10-CM | POA: Diagnosis not present

## 2018-09-29 DIAGNOSIS — M545 Low back pain: Secondary | ICD-10-CM | POA: Diagnosis not present

## 2018-09-29 DIAGNOSIS — E1165 Type 2 diabetes mellitus with hyperglycemia: Secondary | ICD-10-CM | POA: Diagnosis not present

## 2018-09-29 DIAGNOSIS — I10 Essential (primary) hypertension: Secondary | ICD-10-CM | POA: Diagnosis not present

## 2018-09-29 DIAGNOSIS — Z0000 Encounter for general adult medical examination without abnormal findings: Secondary | ICD-10-CM | POA: Diagnosis not present

## 2018-09-30 ENCOUNTER — Other Ambulatory Visit: Payer: Self-pay | Admitting: Neurosurgery

## 2018-10-22 ENCOUNTER — Other Ambulatory Visit (HOSPITAL_COMMUNITY): Payer: Self-pay | Admitting: Otolaryngology

## 2018-10-22 ENCOUNTER — Other Ambulatory Visit: Payer: Self-pay | Admitting: Otolaryngology

## 2018-10-22 DIAGNOSIS — E041 Nontoxic single thyroid nodule: Secondary | ICD-10-CM

## 2018-10-23 ENCOUNTER — Other Ambulatory Visit: Payer: Self-pay | Admitting: Neurosurgery

## 2018-11-03 NOTE — Pre-Procedure Instructions (Signed)
[place], [place] [address] [place] [address] Phone: [phone] Fax: [phone]      Your procedure is scheduled on Friday 11-07-18  Report to [facility] Main Entrance "A" at 0730 A.M., and check in at the Admitting office.  Call this number if you have problems the morning of surgery:  [phone]  Call [phone] if you have any questions prior to your surgery date Monday-Friday 8am-4pm    Remember:  Do not eat or drink after midnight.  Take these medicines the morning of surgery with A SIP OF WATER : Acetaminophen(Tylenol) as needed Gabapentin (Neurontin)  Follow your surgeon's instructions on when to stop Aspirin.  If no instructions were given by your surgeon then you will need to call the office to get those instructions.    As of today STOP taking any Aspirin (unless otherwise instructed by your surgeon), Aleve, Naproxen, Ibuprofen, Motrin, Advil, Goody's, BC's, all herbal medications, fish oil, and all vitamins.    WHAT DO I DO ABOUT MY DIABETES MEDICATION?   Marland Kitchen Do not take oral diabetes medicines (pills) including metformin  the morning of surgery.   How to Manage Your Diabetes Before and After Surgery  Why is it important to control my blood sugar before and after surgery? . Improving blood sugar levels before and after surgery helps healing and can limit problems. . A way of improving blood sugar control is eating a healthy diet by: o  Eating less sugar and carbohydrates o  Increasing activity/exercise o  Talking with your doctor about reaching your blood sugar goals . High blood sugars (greater than 180 mg/dL) can raise your risk of infections and slow your recovery, so you will need to focus on controlling your diabetes during the weeks before surgery. . Make sure that the doctor who takes care of your diabetes knows about your planned surgery including the date and location.  How do I manage my blood sugar  before surgery? . Check your blood sugar at least 4 times a day, starting 2 days before surgery, to make sure that the level is not too high or low. o Check your blood sugar the morning of your surgery when you wake up and every 2 hours until you get to the Short Stay unit. . If your blood sugar is less than 70 mg/dL, you will need to treat for low blood sugar: o Do not take insulin. o Treat a low blood sugar (less than 70 mg/dL) with  cup of clear juice (cranberry or apple), 4 glucose tablets, OR glucose gel. o Recheck blood sugar in 15 minutes after treatment (to make sure it is greater than 70 mg/dL). If your blood sugar is not greater than 70 mg/dL on recheck, call [phone] for further instructions. . Report your blood sugar to the short stay nurse when you get to Short Stay.  . If you are admitted to the hospital after surgery: o Your blood sugar will be checked by the staff and you will probably be given insulin after surgery (instead of oral diabetes medicines) to make sure you have good blood sugar levels. o The goal for blood sugar control after surgery is 80-180 mg/dL.  The Morning of Surgery  Do not wear jewelry, make-up or nail polish.  Do not wear lotions, powders, or perfumes/colognes, or deodorant  Do not shave 48 hours prior to surgery.    Do not bring valuables to the hospital.  [facility] is not responsible for any belongings or valuables.  If you are a smoker, DO NOT Smoke 24 hours prior to surgery IF you wear a CPAP at night please bring your mask, tubing, and machine the morning of surgery   Remember that you must have someone to transport you home after your surgery, and remain with you for 24 hours if you are discharged the same day.   Contacts, glasses, hearing aids, dentures or bridgework may not be worn into surgery.   For patients admitted to the hospital, discharge time will be determined by your treatment team.  Patients discharged the day of surgery  will not be allowed to drive home.    Special instructions:   [facility]- Preparing For Surgery  Before surgery, you can play an important role. Because skin is not sterile, your skin needs to be as free of germs as possible. You can reduce the number of germs on your skin by washing with CHG (chlorahexidine gluconate) Soap before surgery.  CHG is an antiseptic cleaner which kills germs and bonds with the skin to continue killing germs even after washing.    Oral Hygiene is also important to reduce your risk of infection.  Remember - BRUSH YOUR TEETH THE MORNING OF SURGERY WITH YOUR REGULAR TOOTHPASTE  Please do not use if you have an allergy to CHG or antibacterial soaps. If your skin becomes reddened/irritated stop using the CHG.  Do not shave (including legs and underarms) for at least 48 hours prior to first CHG shower. It is OK to shave your face.  Please follow these instructions carefully.   1. Shower the NIGHT BEFORE SURGERY and the MORNING OF SURGERY with CHG Soap.   2. If you chose to wash your hair, wash your hair first as usual with your normal shampoo.  3. After you shampoo, rinse your hair and body thoroughly to remove the shampoo.  4. Use CHG as you would any other liquid soap. You can apply CHG directly to the skin and wash gently with a scrungie or a clean washcloth.   5. Apply the CHG Soap to your body ONLY FROM THE NECK DOWN.  Do not use on open wounds or open sores. Avoid contact with your eyes, ears, mouth and genitals (private parts). Wash Face and genitals (private parts)  with your normal soap.   6. Wash thoroughly, paying special attention to the area where your surgery will be performed.  7. Thoroughly rinse your body with warm water from the neck down.  8. DO NOT shower/wash with your normal soap after using and rinsing off the CHG Soap.  9. Pat yourself dry with a CLEAN TOWEL.  10. Wear CLEAN PAJAMAS to bed the night before surgery, wear comfortable  clothes the morning of surgery  11. Place CLEAN SHEETS on your bed the night of your first shower and DO NOT SLEEP WITH PETS.    Day of Surgery:  Do not apply any deodorants/lotions.  Please wear clean clothes to the hospital/surgery center.   Remember to brush your teeth WITH YOUR REGULAR TOOTHPASTE.   Please read over the following fact sheets that you were given.

## 2018-11-04 ENCOUNTER — Other Ambulatory Visit: Payer: Self-pay

## 2018-11-04 ENCOUNTER — Other Ambulatory Visit (HOSPITAL_COMMUNITY)
Admission: RE | Admit: 2018-11-04 | Discharge: 2018-11-04 | Disposition: A | Discharge status: Home / Self Care | Payer: Medicare Other | Source: Ambulatory Visit | Attending: Neurosurgery | Admitting: Neurosurgery

## 2018-11-04 ENCOUNTER — Encounter (HOSPITAL_COMMUNITY)
Admission: RE | Admit: 2018-11-04 | Discharge: 2018-11-04 | Disposition: A | Discharge status: Home / Self Care | Payer: Medicare Other | Source: Ambulatory Visit | Attending: Neurosurgery | Admitting: Neurosurgery

## 2018-11-04 DIAGNOSIS — M4316 Spondylolisthesis, lumbar region: Secondary | ICD-10-CM | POA: Diagnosis not present

## 2018-11-04 DIAGNOSIS — M5416 Radiculopathy, lumbar region: Secondary | ICD-10-CM | POA: Diagnosis not present

## 2018-11-04 DIAGNOSIS — Z01818 Encounter for other preprocedural examination: Secondary | ICD-10-CM | POA: Insufficient documentation

## 2018-11-04 DIAGNOSIS — M5126 Other intervertebral disc displacement, lumbar region: Secondary | ICD-10-CM | POA: Diagnosis not present

## 2018-11-04 DIAGNOSIS — Z6832 Body mass index (BMI) 32.0-32.9, adult: Secondary | ICD-10-CM | POA: Diagnosis not present

## 2018-11-04 DIAGNOSIS — E669 Obesity, unspecified: Secondary | ICD-10-CM | POA: Diagnosis not present

## 2018-11-04 DIAGNOSIS — Z7984 Long term (current) use of oral hypoglycemic drugs: Secondary | ICD-10-CM | POA: Diagnosis not present

## 2018-11-04 DIAGNOSIS — G9741 Accidental puncture or laceration of dura during a procedure: Secondary | ICD-10-CM | POA: Diagnosis not present

## 2018-11-04 DIAGNOSIS — M48061 Spinal stenosis, lumbar region without neurogenic claudication: Secondary | ICD-10-CM | POA: Diagnosis not present

## 2018-11-04 DIAGNOSIS — I10 Essential (primary) hypertension: Secondary | ICD-10-CM | POA: Diagnosis not present

## 2018-11-04 DIAGNOSIS — M4326 Fusion of spine, lumbar region: Secondary | ICD-10-CM | POA: Diagnosis not present

## 2018-11-04 DIAGNOSIS — Z79899 Other long term (current) drug therapy: Secondary | ICD-10-CM | POA: Diagnosis not present

## 2018-11-04 DIAGNOSIS — Z882 Allergy status to sulfonamides status: Secondary | ICD-10-CM | POA: Diagnosis not present

## 2018-11-04 DIAGNOSIS — M48062 Spinal stenosis, lumbar region with neurogenic claudication: Secondary | ICD-10-CM | POA: Diagnosis not present

## 2018-11-04 DIAGNOSIS — E119 Type 2 diabetes mellitus without complications: Secondary | ICD-10-CM | POA: Diagnosis not present

## 2018-11-04 DIAGNOSIS — R9431 Abnormal electrocardiogram [ECG] [EKG]: Secondary | ICD-10-CM | POA: Insufficient documentation

## 2018-11-04 DIAGNOSIS — Z7982 Long term (current) use of aspirin: Secondary | ICD-10-CM | POA: Diagnosis not present

## 2018-11-04 DIAGNOSIS — Z1159 Encounter for screening for other viral diseases: Secondary | ICD-10-CM | POA: Diagnosis not present

## 2018-11-04 LAB — SURGICAL PCR SCREEN
MRSA, PCR: NEGATIVE
Staphylococcus aureus: NEGATIVE

## 2018-11-04 LAB — BASIC METABOLIC PANEL
Anion gap: 11 (ref 5–15)
BUN: 10 mg/dL (ref 8–23)
CO2: 24 mmol/L (ref 22–32)
Calcium: 9 mg/dL (ref 8.9–10.3)
Chloride: 96 mmol/L — ABNORMAL LOW (ref 98–111)
Creatinine, Ser: 0.65 mg/dL (ref 0.44–1.00)
GFR calc Af Amer: >60 mL/min (ref >60)
GFR calc non Af Amer: >60 mL/min (ref >60)
Glucose, Bld: 106 mg/dL — ABNORMAL HIGH (ref 70–99)
Potassium: 3.4 mmol/L — ABNORMAL LOW (ref 3.5–5.1)
Sodium: 131 mmol/L — ABNORMAL LOW (ref 135–145)

## 2018-11-04 LAB — CBC
HCT: 42.9 % (ref 36.0–46.0)
Hemoglobin: 14.1 g/dL (ref 12.0–15.0)
MCH: 31.2 pg (ref 26.0–34.0)
MCHC: 32.9 g/dL (ref 30.0–36.0)
MCV: 94.9 fL (ref 80.0–100.0)
Platelets: 346 10*3/uL (ref 150–400)
RBC: 4.52 MIL/uL (ref 3.87–5.11)
RDW: 13 % (ref 11.5–15.5)
WBC: 8.1 10*3/uL (ref 4.0–10.5)
nRBC: 0 % (ref 0.0–0.2)

## 2018-11-04 LAB — HEMOGLOBIN A1C
Hgb A1c MFr Bld: 6.9 % — ABNORMAL HIGH (ref 4.8–5.6)
Mean Plasma Glucose: 151.33 mg/dL

## 2018-11-04 LAB — TYPE AND SCREEN
ABO/RH(D): A NEG
Antibody Screen: NEGATIVE

## 2018-11-04 LAB — GLUCOSE, CAPILLARY: Glucose-Capillary: 139 mg/dL — ABNORMAL HIGH (ref 70–99)

## 2018-11-04 LAB — ABO/RH: ABO/RH(D): A NEG

## 2018-11-04 NOTE — Progress Notes (Signed)
  Coronavirus Screening Pt scheduled for COVID test today at [facility] Have you experienced the following symptoms:  Cough yes/no: No Fever (>100.29F)  yes/no: No Runny nose yes/no: No Sore throat yes/no: No Difficulty breathing/shortness of breath  yes/no: No Loss of smell or taste-No Have you or a family member traveled in the last 14 days and where? yes/no: No  PCP - Dr Rory Percy ,[facility],[place]  Cardiologist - denies  Endocrinologist-Dr. Benjamine Mola  Orthopedics-Dr. Caffrey  Chest x-ray - NA  EKG - 11-04-18  Stress Test - Yrs ago,does not recall when or where it was done  ECHO - denies  Cardiac Cath - denies  AICD-denies PM-denies LOOP-denies  Sleep Study - Yrs ago. Records requested CPAP - Does not wear one  LABS-CBC,BMP,PCR,T/S,A1C  ASA-LD 10/31/18 Not on any blood thinners.  ERAS-NA  HA1C-11-04-18 Fasting Blood Sugar - 139  Lowest 100, Highest 169 Checks Blood Sugar _2____ times a week.  Anesthesia-Y. Review of old records.  Pt denies having chest pain, sob, or fever at this time. All instructions explained to the pt, with a verbal understanding of the material. Pt agrees to go over the instructions while at home for a better understanding. The opportunity to ask questions was provided.

## 2018-11-05 LAB — HEPATIC FUNCTION PANEL
ALT: 32 U/L (ref 0–44)
AST: 24 U/L (ref 15–41)
Albumin: 4.4 g/dL (ref 3.5–5.0)
Alkaline Phosphatase: 52 U/L (ref 38–126)
Bilirubin, Direct: <0.1 mg/dL (ref 0.0–0.2)
Total Bilirubin: 0.5 mg/dL (ref 0.3–1.2)
Total Protein: 6.9 g/dL (ref 6.5–8.1)

## 2018-11-05 LAB — NOVEL CORONAVIRUS, NAA (HOSP ORDER, SEND-OUT TO REF LAB; TAT 18-24 HRS): SARS-CoV-2, NAA: NOT DETECTED

## 2018-11-06 ENCOUNTER — Encounter (HOSPITAL_COMMUNITY): Payer: Self-pay | Admitting: Anesthesiology

## 2018-11-06 NOTE — Anesthesia Preprocedure Evaluation (Addendum)
Anesthesia Evaluation  Patient identified by MRN, date of birth, ID band Patient awake    Reviewed: Allergy & Precautions, NPO status , Patient's Chart, lab work & pertinent test results  Airway Mallampati: II       Dental no notable dental hx. (+) Teeth Intact   Pulmonary neg pulmonary ROS,    Pulmonary exam normal breath sounds clear to auscultation       Cardiovascular hypertension, Pt. on medications Normal cardiovascular exam Rhythm:Regular Rate:Normal     Neuro/Psych negative neurological ROS  negative psych ROS   GI/Hepatic negative GI ROS, Neg liver ROS,   Endo/Other  diabetes, Type 2, Oral Hypoglycemic Agents  Renal/GU negative Renal ROS  negative genitourinary   Musculoskeletal   Abdominal (+) + obese,   Peds  Hematology negative hematology ROS (+)   Anesthesia Other Findings   Reproductive/Obstetrics                           Anesthesia Physical Anesthesia Plan  ASA: II  Anesthesia Plan: General   Post-op Pain Management:    Induction: Intravenous  PONV Risk Score and Plan: 3 and Ondansetron, Dexamethasone and Midazolam  Airway Management Planned: Oral ETT  Additional Equipment:   Intra-op Plan:   Post-operative Plan: Extubation in OR  Informed Consent: I have reviewed the patients History and Physical, chart, labs and discussed the procedure including the risks, benefits and alternatives for the proposed anesthesia with the patient or authorized representative who has indicated his/her understanding and acceptance.     Dental advisory given  Plan Discussed with: CRNA  Anesthesia Plan Comments:        Anesthesia Quick Evaluation

## 2018-11-06 NOTE — H&P (Signed)
Chief Complaint   Back pain  HPI   HPI: Dorothy Robbins is a 68 y.o. female with relatively chronic history of severe low back and bilateral leg pain secondary to L4-5 stenosis with spondylolisthesis. She describes severe pain in the tailbone area, with "lightning shock" type pain running up and down her legs whenever she stands or walks for any length of time.  She says that the pain affects her ability to carry out normal daily activities. She has failed conservative treatment to date including PT, ESI and po medications. She presents today for surgical decompression and fusion. She is without any concerns.    Patient Active Problem List   Diagnosis Date Noted  . Elevated liver enzymes 11/06/2016  . Essential hypertension 05/08/2016  . Diabetes ([place]) 05/08/2016    PMH: Past Medical History:  Diagnosis Date  . Diabetes ([place]) 05/08/2016  . Elevated liver enzymes 11/06/2016  . Essential hypertension 05/08/2016    PSH:  No medications prior to admission.    SH: Social History   Tobacco Use  . Smoking status: Never Smoker  . Smokeless tobacco: Never Used  Substance Use Topics  . Alcohol use: No  . Drug use: No    MEDS: Prior to Admission medications   Medication Sig Start Date End Date Taking? Authorizing Provider  acetaminophen (TYLENOL) 500 MG tablet Take 1,000 mg by mouth every 8 (eight) hours as needed for mild pain or headache.   Yes [provider]  gabapentin (NEURONTIN) 300 MG capsule Take 300 mg by mouth 2 (two) times a day. 07/15/18  Yes [provider]  lisinopril-hydrochlorothiazide (PRINZIDE,ZESTORETIC) 20-25 MG tablet Take 1 tablet by mouth at bedtime.    Yes [provider]  metFORMIN (GLUCOPHAGE-XR) 500 MG 24 hr tablet Take 500 mg by mouth daily with breakfast.   Yes [provider]  Multiple Vitamin (MULTIVITAMIN) tablet Take 1 tablet by mouth daily.   Yes [provider]  vitamin C (ASCORBIC ACID) 500 MG  tablet Take 500 mg by mouth daily.   Yes [provider]  Vitamin D, Cholecalciferol, 10 MCG (400 UNIT) TABS Take 400 Units by mouth daily.   Yes [provider]  aspirin 81 MG chewable tablet Chew 81 mg by mouth daily.     [provider]    ALLERGY: Allergies  Allergen Reactions  . Sulfa Antibiotics Rash    Social History   Tobacco Use  . Smoking status: Never Smoker  . Smokeless tobacco: Never Used  Substance Use Topics  . Alcohol use: No     No family history on file.   ROS   ROS  Exam   There were no vitals filed for this visit. General appearance: WDWN, NAD Eyes: No scleral injection Cardiovascular: Regular rate and rhythm without murmurs, rubs, gallops. No edema or variciosities. Distal pulses normal. Pulmonary: Effort normal, non-labored breathing Musculoskeletal:     Muscle tone upper extremities: Normal    Muscle tone lower extremities: Normal    Motor exam: Upper Extremities Deltoid Bicep Tricep Grip  Right 5/5 5/5 5/5 5/5  Left 5/5 5/5 5/5 5/5   Lower Extremity IP Quad PF DF EHL  Right 5/5 5/5 5/5 5/5 5/5  Left 5/5 5/5 5/5 5/5 5/5   Neurological Mental Status:    - Patient is awake, alert, oriented to person, place, month, year, and situation    - Patient is able to give a clear and coherent history.    - No  signs of aphasia or neglect Cranial Nerves    - II: Visual Fields are full. PERRL    - III/IV/VI: EOMI without ptosis or diploplia.     - V: Facial sensation is grossly normal    - VII: Facial movement is symmetric.     - VIII: hearing is intact to voice    - X: Uvula elevates symmetrically    - XI: Shoulder shrug is symmetric.    - XII: tongue is midline without atrophy or fasciculations.  Sensory: Sensation grossly intact to LT  Results - Imaging/Labs   No results found for this or any previous visit (from the past 48 hour(s)).  No results found.  IMAGING: MRI dated 05/01/2018 which again demonstrates  primary pathology at L4-5 where there is grade 1 anterolisthesis, and significant broad-based disc uncovering with possible superimposed disc rupture, ligamentous hypertrophy, and bilateral facet arthropathy which all contribute to severe central and bilateral lateral recess and foraminal stenosis.   Impression/Plan   68 y.o. female with significant low back and bilateral leg pain consistent with imaging finding of severe stenosis with spondylolisthesis at L4-5.  Unfortunately she has failed reasonable conservative treatments. Will plan on proceeding with surgical decompression and fusion at L4-5 consisting of complete diskectomy with placement of interbody biomechanical device, interbody arthrodesis, and posterior nonsegmental instrumentation with cortical pedicle screws  While in the office the risks which include but are not limited to nerve root injury leading to leg or foot weakness/numbness and/or bowel and bladder dysfunction, CSF leak, bleeding, and infection.  Possible outcomes of surgery were also discussed including the possibility of persistence or worsening of pain symptoms and the possibility of accelerated adjacent level degeneration. The general risks of anesthesia were also reviewed including heart attack, stroke, and DVT/PE.  The patient understood our discussion and is willing to proceed with surgical decompression and fusion.  All questions today were answered.  Ferne Reus, PA-C [facility] [employer]

## 2018-11-07 ENCOUNTER — Inpatient Hospital Stay (HOSPITAL_COMMUNITY)
Admission: RE | Admit: 2018-11-07 | Discharge: 2018-11-08 | DRG: 455 | Disposition: A | Discharge status: Home / Self Care | Payer: Medicare Other | Attending: Neurosurgery | Admitting: Neurosurgery

## 2018-11-07 ENCOUNTER — Encounter (HOSPITAL_COMMUNITY)
Admission: RE | Disposition: A | Discharge status: Home / Self Care | Payer: Self-pay | Source: Home / Self Care | Attending: Neurosurgery

## 2018-11-07 ENCOUNTER — Encounter (HOSPITAL_COMMUNITY): Payer: Self-pay

## 2018-11-07 ENCOUNTER — Inpatient Hospital Stay (HOSPITAL_COMMUNITY): Payer: Medicare Other | Admitting: Physician Assistant

## 2018-11-07 ENCOUNTER — Other Ambulatory Visit: Payer: Self-pay

## 2018-11-07 ENCOUNTER — Inpatient Hospital Stay (HOSPITAL_COMMUNITY): Payer: Medicare Other

## 2018-11-07 DIAGNOSIS — M4316 Spondylolisthesis, lumbar region: Secondary | ICD-10-CM | POA: Diagnosis present

## 2018-11-07 DIAGNOSIS — Z882 Allergy status to sulfonamides status: Secondary | ICD-10-CM

## 2018-11-07 DIAGNOSIS — Z1159 Encounter for screening for other viral diseases: Secondary | ICD-10-CM | POA: Diagnosis not present

## 2018-11-07 DIAGNOSIS — I10 Essential (primary) hypertension: Secondary | ICD-10-CM | POA: Diagnosis present

## 2018-11-07 DIAGNOSIS — Z7984 Long term (current) use of oral hypoglycemic drugs: Secondary | ICD-10-CM | POA: Diagnosis not present

## 2018-11-07 DIAGNOSIS — M5416 Radiculopathy, lumbar region: Secondary | ICD-10-CM | POA: Diagnosis present

## 2018-11-07 DIAGNOSIS — E119 Type 2 diabetes mellitus without complications: Secondary | ICD-10-CM | POA: Diagnosis present

## 2018-11-07 DIAGNOSIS — Z419 Encounter for procedure for purposes other than remedying health state, unspecified: Secondary | ICD-10-CM

## 2018-11-07 DIAGNOSIS — M48062 Spinal stenosis, lumbar region with neurogenic claudication: Principal | ICD-10-CM | POA: Diagnosis present

## 2018-11-07 DIAGNOSIS — Z7982 Long term (current) use of aspirin: Secondary | ICD-10-CM | POA: Diagnosis not present

## 2018-11-07 DIAGNOSIS — Z79899 Other long term (current) drug therapy: Secondary | ICD-10-CM | POA: Diagnosis not present

## 2018-11-07 DIAGNOSIS — Z6832 Body mass index (BMI) 32.0-32.9, adult: Secondary | ICD-10-CM

## 2018-11-07 DIAGNOSIS — E669 Obesity, unspecified: Secondary | ICD-10-CM | POA: Diagnosis present

## 2018-11-07 LAB — CBC
HCT: 35.2 % — ABNORMAL LOW (ref 36.0–46.0)
Hemoglobin: 12 g/dL (ref 12.0–15.0)
MCH: 31.6 pg (ref 26.0–34.0)
MCHC: 34.1 g/dL (ref 30.0–36.0)
MCV: 92.6 fL (ref 80.0–100.0)
Platelets: 298 10*3/uL (ref 150–400)
RBC: 3.8 MIL/uL — ABNORMAL LOW (ref 3.87–5.11)
RDW: 12.9 % (ref 11.5–15.5)
WBC: 13.6 10*3/uL — ABNORMAL HIGH (ref 4.0–10.5)
nRBC: 0 % (ref 0.0–0.2)

## 2018-11-07 LAB — GLUCOSE, CAPILLARY
Glucose-Capillary: 137 mg/dL — ABNORMAL HIGH (ref 70–99)
Glucose-Capillary: 160 mg/dL — ABNORMAL HIGH (ref 70–99)
Glucose-Capillary: 225 mg/dL — ABNORMAL HIGH (ref 70–99)
Glucose-Capillary: 167 mg/dL — ABNORMAL HIGH (ref 70–99)

## 2018-11-07 LAB — CREATININE, SERUM
Creatinine, Ser: 0.8 mg/dL (ref 0.44–1.00)
GFR calc Af Amer: >60 mL/min (ref >60)
GFR calc non Af Amer: >60 mL/min (ref >60)

## 2018-11-07 SURGERY — POSTERIOR LUMBAR FUSION 1 LEVEL
Anesthesia: General

## 2018-11-07 MED ORDER — HYDROCODONE-ACETAMINOPHEN 5-325 MG PO TABS
ORAL_TABLET | ORAL | Status: AC
  Filled 2018-11-07: qty 1

## 2018-11-07 MED ORDER — THROMBIN 20000 UNITS EX SOLR
CUTANEOUS | Status: AC
  Filled 2018-11-07: qty 20000

## 2018-11-07 MED ORDER — HYDROCODONE-ACETAMINOPHEN 5-325 MG PO TABS: 1.0000 | ORAL_TABLET | ORAL | Status: DC | PRN

## 2018-11-07 MED ORDER — ONDANSETRON HCL 4 MG PO TABS: 4.0000 mg | ORAL_TABLET | Freq: Four times a day (QID) | ORAL | Status: DC | PRN

## 2018-11-07 MED ORDER — PROPOFOL 10 MG/ML IV BOLUS
INTRAVENOUS | Status: AC
  Filled 2018-11-07: qty 20

## 2018-11-07 MED ORDER — HYDROCODONE-ACETAMINOPHEN 10-325 MG PO TABS
2.0000 | ORAL_TABLET | ORAL | Status: DC | PRN
  Administered 2018-11-07: 2 via ORAL
  Administered 2018-11-07: 1 via ORAL
  Administered 2018-11-08 (×2): 2 via ORAL
  Filled 2018-11-07 (×4): qty 2

## 2018-11-07 MED ORDER — ONDANSETRON HCL 4 MG/2ML IJ SOLN: 4.0000 mg | Freq: Four times a day (QID) | INTRAMUSCULAR | Status: DC | PRN

## 2018-11-07 MED ORDER — HEPARIN SODIUM (PORCINE) 5000 UNIT/ML IJ SOLN
5000.0000 [IU] | Freq: Three times a day (TID) | INTRAMUSCULAR | Status: DC
  Filled 2018-11-07 (×2): qty 1

## 2018-11-07 MED ORDER — SENNA 8.6 MG PO TABS
1.0000 | ORAL_TABLET | Freq: Two times a day (BID) | ORAL | Status: DC
  Administered 2018-11-07: 8.6 mg via ORAL
  Filled 2018-11-07: qty 1

## 2018-11-07 MED ORDER — LIDOCAINE-EPINEPHRINE 1 %-1:100000 IJ SOLN
INTRAMUSCULAR | Status: DC | PRN
  Administered 2018-11-07: 5 mL

## 2018-11-07 MED ORDER — LISINOPRIL 20 MG PO TABS: 20.0000 mg | ORAL_TABLET | Freq: Every day | ORAL | Status: DC

## 2018-11-07 MED ORDER — INSULIN ASPART 100 UNIT/ML ~~LOC~~ SOLN: 0.0000 [IU] | Freq: Every day | SUBCUTANEOUS | Status: DC

## 2018-11-07 MED ORDER — DEXAMETHASONE SODIUM PHOSPHATE 10 MG/ML IJ SOLN
INTRAMUSCULAR | Status: DC | PRN
  Administered 2018-11-07: 10 mg via INTRAVENOUS

## 2018-11-07 MED ORDER — PROMETHAZINE HCL 25 MG/ML IJ SOLN
6.2500 mg | INTRAMUSCULAR | Status: DC | PRN
  Administered 2018-11-07: 6.25 mg via INTRAVENOUS

## 2018-11-07 MED ORDER — CEFAZOLIN SODIUM-DEXTROSE 2-4 GM/100ML-% IV SOLN
2.0000 g | Freq: Three times a day (TID) | INTRAVENOUS | Status: AC
  Administered 2018-11-07 (×2): 2 g via INTRAVENOUS
  Filled 2018-11-07 (×2): qty 100

## 2018-11-07 MED ORDER — PHENYLEPHRINE 40 MCG/ML (10ML) SYRINGE FOR IV PUSH (FOR BLOOD PRESSURE SUPPORT)
PREFILLED_SYRINGE | INTRAVENOUS | Status: AC
  Filled 2018-11-07: qty 10

## 2018-11-07 MED ORDER — PROPOFOL 10 MG/ML IV BOLUS
INTRAVENOUS | Status: DC | PRN
  Administered 2018-11-07: 150 mg via INTRAVENOUS

## 2018-11-07 MED ORDER — 0.9 % SODIUM CHLORIDE (POUR BTL) OPTIME
TOPICAL | Status: DC | PRN
  Administered 2018-11-07: 1000 mL

## 2018-11-07 MED ORDER — PROMETHAZINE HCL 25 MG/ML IJ SOLN
INTRAMUSCULAR | Status: AC
  Administered 2018-11-07: 6.25 mg via INTRAVENOUS
  Filled 2018-11-07: qty 1

## 2018-11-07 MED ORDER — PHENOL 1.4 % MT LIQD: 1.0000 | OROMUCOSAL | Status: DC | PRN

## 2018-11-07 MED ORDER — HYDROCHLOROTHIAZIDE 25 MG PO TABS: 25.0000 mg | ORAL_TABLET | Freq: Every day | ORAL | Status: DC

## 2018-11-07 MED ORDER — MIDAZOLAM HCL 5 MG/5ML IJ SOLN
INTRAMUSCULAR | Status: DC | PRN
  Administered 2018-11-07: 2 mg via INTRAVENOUS

## 2018-11-07 MED ORDER — METFORMIN HCL 500 MG PO TABS
500.0000 mg | ORAL_TABLET | Freq: Every day | ORAL | Status: DC
  Administered 2018-11-08: 500 mg via ORAL
  Filled 2018-11-07: qty 1

## 2018-11-07 MED ORDER — SODIUM CHLORIDE 0.9 % IV SOLN: 250.0000 mL | INTRAVENOUS | Status: DC

## 2018-11-07 MED ORDER — METHOCARBAMOL 500 MG PO TABS
500.0000 mg | ORAL_TABLET | Freq: Four times a day (QID) | ORAL | Status: DC | PRN
  Administered 2018-11-07 – 2018-11-08 (×3): 500 mg via ORAL
  Filled 2018-11-07 (×3): qty 1

## 2018-11-07 MED ORDER — ACETAMINOPHEN 500 MG PO TABS: 1000.0000 mg | ORAL_TABLET | Freq: Three times a day (TID) | ORAL | Status: DC | PRN

## 2018-11-07 MED ORDER — THROMBIN 5000 UNITS EX SOLR
OROMUCOSAL | Status: DC | PRN
  Administered 2018-11-07: 5 mL via TOPICAL

## 2018-11-07 MED ORDER — BISACODYL 10 MG RE SUPP: 10.0000 mg | Freq: Every day | RECTAL | Status: DC | PRN

## 2018-11-07 MED ORDER — CHLORHEXIDINE GLUCONATE CLOTH 2 % EX PADS: 6.0000 | MEDICATED_PAD | Freq: Once | CUTANEOUS | Status: DC

## 2018-11-07 MED ORDER — INSULIN ASPART 100 UNIT/ML ~~LOC~~ SOLN
0.0000 [IU] | Freq: Three times a day (TID) | SUBCUTANEOUS | Status: DC
  Administered 2018-11-07: 5 [IU] via SUBCUTANEOUS
  Administered 2018-11-07: 3 [IU] via SUBCUTANEOUS
  Administered 2018-11-08: 2 [IU] via SUBCUTANEOUS

## 2018-11-07 MED ORDER — HYDROMORPHONE HCL 1 MG/ML IJ SOLN
0.2500 mg | INTRAMUSCULAR | Status: DC | PRN
  Administered 2018-11-07 (×2): 0.5 mg via INTRAVENOUS

## 2018-11-07 MED ORDER — MORPHINE SULFATE (PF) 2 MG/ML IV SOLN: 2.0000 mg | INTRAVENOUS | Status: DC | PRN

## 2018-11-07 MED ORDER — SODIUM CHLORIDE 0.9 % IV SOLN
INTRAVENOUS | Status: DC | PRN
  Administered 2018-11-07: 20 ug/min via INTRAVENOUS

## 2018-11-07 MED ORDER — SODIUM CHLORIDE 0.9 % IV SOLN
INTRAVENOUS | Status: DC | PRN
  Administered 2018-11-07: 500 mL

## 2018-11-07 MED ORDER — DEXAMETHASONE SODIUM PHOSPHATE 10 MG/ML IJ SOLN
INTRAMUSCULAR | Status: AC
  Filled 2018-11-07: qty 1

## 2018-11-07 MED ORDER — SODIUM CHLORIDE 0.9% FLUSH: 3.0000 mL | Freq: Two times a day (BID) | INTRAVENOUS | Status: DC

## 2018-11-07 MED ORDER — BUPIVACAINE HCL (PF) 0.5 % IJ SOLN
INTRAMUSCULAR | Status: AC
  Filled 2018-11-07: qty 30

## 2018-11-07 MED ORDER — LACTATED RINGERS IV SOLN
INTRAVENOUS | Status: DC | PRN
  Administered 2018-11-07 (×2): via INTRAVENOUS

## 2018-11-07 MED ORDER — CEFAZOLIN SODIUM-DEXTROSE 2-4 GM/100ML-% IV SOLN
INTRAVENOUS | Status: AC
  Filled 2018-11-07: qty 100

## 2018-11-07 MED ORDER — SODIUM CHLORIDE 0.9% FLUSH: 3.0000 mL | INTRAVENOUS | Status: DC | PRN

## 2018-11-07 MED ORDER — ALBUMIN HUMAN 5 % IV SOLN
INTRAVENOUS | Status: DC | PRN
  Administered 2018-11-07: 11:00:00 via INTRAVENOUS

## 2018-11-07 MED ORDER — PANTOPRAZOLE SODIUM 40 MG IV SOLR
40.0000 mg | Freq: Every day | INTRAVENOUS | Status: DC
  Administered 2018-11-07: 40 mg via INTRAVENOUS
  Filled 2018-11-07: qty 40

## 2018-11-07 MED ORDER — VITAMIN C 500 MG PO TABS
500.0000 mg | ORAL_TABLET | Freq: Every day | ORAL | Status: DC
  Administered 2018-11-07: 500 mg via ORAL
  Filled 2018-11-07: qty 1

## 2018-11-07 MED ORDER — ONDANSETRON HCL 4 MG/2ML IJ SOLN
INTRAMUSCULAR | Status: AC
  Filled 2018-11-07: qty 2

## 2018-11-07 MED ORDER — LISINOPRIL-HYDROCHLOROTHIAZIDE 20-25 MG PO TABS: 1.0000 | ORAL_TABLET | Freq: Every day | ORAL | Status: DC

## 2018-11-07 MED ORDER — ADULT MULTIVITAMIN W/MINERALS CH
1.0000 | ORAL_TABLET | Freq: Every day | ORAL | Status: DC
  Administered 2018-11-07: 1 via ORAL
  Filled 2018-11-07: qty 1

## 2018-11-07 MED ORDER — HYDROMORPHONE HCL 1 MG/ML IJ SOLN
INTRAMUSCULAR | Status: AC
  Administered 2018-11-07: 0.5 mg via INTRAVENOUS
  Filled 2018-11-07: qty 1

## 2018-11-07 MED ORDER — MENTHOL 3 MG MT LOZG: 1.0000 | LOZENGE | OROMUCOSAL | Status: DC | PRN

## 2018-11-07 MED ORDER — KETOROLAC TROMETHAMINE 15 MG/ML IJ SOLN
INTRAMUSCULAR | Status: AC
  Administered 2018-11-07: 15 mg via INTRAVENOUS
  Filled 2018-11-07: qty 1

## 2018-11-07 MED ORDER — METHOCARBAMOL 500 MG PO TABS
ORAL_TABLET | ORAL | Status: AC
  Filled 2018-11-07: qty 1

## 2018-11-07 MED ORDER — MIDAZOLAM HCL 2 MG/2ML IJ SOLN
INTRAMUSCULAR | Status: AC
  Filled 2018-11-07: qty 2

## 2018-11-07 MED ORDER — METHOCARBAMOL 1000 MG/10ML IJ SOLN
500.0000 mg | Freq: Four times a day (QID) | INTRAVENOUS | Status: DC | PRN
  Filled 2018-11-07: qty 5

## 2018-11-07 MED ORDER — ROCURONIUM BROMIDE 10 MG/ML (PF) SYRINGE
PREFILLED_SYRINGE | INTRAVENOUS | Status: AC
  Filled 2018-11-07: qty 10

## 2018-11-07 MED ORDER — LIDOCAINE-EPINEPHRINE 1 %-1:100000 IJ SOLN
INTRAMUSCULAR | Status: AC
  Filled 2018-11-07: qty 1

## 2018-11-07 MED ORDER — GABAPENTIN 300 MG PO CAPS
300.0000 mg | ORAL_CAPSULE | Freq: Two times a day (BID) | ORAL | Status: DC
  Administered 2018-11-07: 300 mg via ORAL
  Filled 2018-11-07: qty 1

## 2018-11-07 MED ORDER — METFORMIN HCL ER 500 MG PO TB24: 500.0000 mg | ORAL_TABLET | Freq: Every day | ORAL | Status: DC

## 2018-11-07 MED ORDER — MEPERIDINE HCL 25 MG/ML IJ SOLN: 6.2500 mg | INTRAMUSCULAR | Status: DC | PRN

## 2018-11-07 MED ORDER — CHOLECALCIFEROL 10 MCG (400 UNIT) PO TABS
400.0000 [IU] | ORAL_TABLET | Freq: Every day | ORAL | Status: DC
  Filled 2018-11-07 (×2): qty 1

## 2018-11-07 MED ORDER — FENTANYL CITRATE (PF) 250 MCG/5ML IJ SOLN
INTRAMUSCULAR | Status: AC
  Filled 2018-11-07: qty 5

## 2018-11-07 MED ORDER — ACETAMINOPHEN 650 MG RE SUPP: 650.0000 mg | RECTAL | Status: DC | PRN

## 2018-11-07 MED ORDER — DOCUSATE SODIUM 100 MG PO CAPS
100.0000 mg | ORAL_CAPSULE | Freq: Two times a day (BID) | ORAL | Status: DC
  Administered 2018-11-07 (×2): 100 mg via ORAL
  Filled 2018-11-07 (×2): qty 1

## 2018-11-07 MED ORDER — SUGAMMADEX SODIUM 200 MG/2ML IV SOLN
INTRAVENOUS | Status: DC | PRN
  Administered 2018-11-07: 200 mg via INTRAVENOUS

## 2018-11-07 MED ORDER — SODIUM CHLORIDE 0.9 % IV SOLN: INTRAVENOUS | Status: DC

## 2018-11-07 MED ORDER — ACETAMINOPHEN 325 MG PO TABS: 650.0000 mg | ORAL_TABLET | ORAL | Status: DC | PRN

## 2018-11-07 MED ORDER — THROMBIN 20000 UNITS EX SOLR
CUTANEOUS | Status: DC | PRN
  Administered 2018-11-07: 20 mL via TOPICAL

## 2018-11-07 MED ORDER — PHENYLEPHRINE HCL (PRESSORS) 10 MG/ML IV SOLN
INTRAVENOUS | Status: DC | PRN
  Administered 2018-11-07: 80 ug via INTRAVENOUS

## 2018-11-07 MED ORDER — FENTANYL CITRATE (PF) 100 MCG/2ML IJ SOLN
INTRAMUSCULAR | Status: DC | PRN
  Administered 2018-11-07 (×2): 50 ug via INTRAVENOUS
  Administered 2018-11-07: 100 ug via INTRAVENOUS

## 2018-11-07 MED ORDER — BUPIVACAINE HCL (PF) 0.5 % IJ SOLN
INTRAMUSCULAR | Status: DC | PRN
  Administered 2018-11-07: 5 mL

## 2018-11-07 MED ORDER — LIDOCAINE 2% (20 MG/ML) 5 ML SYRINGE
INTRAMUSCULAR | Status: AC
  Filled 2018-11-07: qty 5

## 2018-11-07 MED ORDER — THROMBIN 5000 UNITS EX SOLR
CUTANEOUS | Status: AC
  Filled 2018-11-07: qty 5000

## 2018-11-07 MED ORDER — ROCURONIUM BROMIDE 50 MG/5ML IV SOSY
PREFILLED_SYRINGE | INTRAVENOUS | Status: DC | PRN
  Administered 2018-11-07: 15 mg via INTRAVENOUS
  Administered 2018-11-07: 50 mg via INTRAVENOUS
  Administered 2018-11-07: 20 mg via INTRAVENOUS

## 2018-11-07 MED ORDER — HEMOSTATIC AGENTS (NO CHARGE) OPTIME
TOPICAL | Status: DC | PRN
  Administered 2018-11-07: 1 via TOPICAL

## 2018-11-07 MED ORDER — CEFAZOLIN SODIUM-DEXTROSE 2-4 GM/100ML-% IV SOLN
2.0000 g | INTRAVENOUS | Status: AC
  Administered 2018-11-07: 2 g via INTRAVENOUS

## 2018-11-07 MED ORDER — LIDOCAINE 2% (20 MG/ML) 5 ML SYRINGE
INTRAMUSCULAR | Status: DC | PRN
  Administered 2018-11-07: 100 mg via INTRAVENOUS

## 2018-11-07 MED ORDER — KETOROLAC TROMETHAMINE 15 MG/ML IJ SOLN
15.0000 mg | Freq: Once | INTRAMUSCULAR | Status: DC
  Administered 2018-11-07: 12:00:00 15 mg via INTRAVENOUS

## 2018-11-07 MED ORDER — ONDANSETRON HCL 4 MG/2ML IJ SOLN
INTRAMUSCULAR | Status: DC | PRN
  Administered 2018-11-07: 4 mg via INTRAVENOUS

## 2018-11-07 SURGICAL SUPPLY — 76 items
ADH SKN CLS APL DERMABOND .7 (GAUZE/BANDAGES/DRESSINGS) ×1
APL SKNCLS STERI-STRIP NONHPOA (GAUZE/BANDAGES/DRESSINGS) ×1
BAG DECANTER FOR FLEXI CONT (MISCELLANEOUS) ×2 IMPLANT
BASKET BONE COLLECTION (BASKET) ×2 IMPLANT
BENZOIN TINCTURE PRP APPL 2/3 (GAUZE/BANDAGES/DRESSINGS) ×1 IMPLANT
BIT DRILL 3.5 POWEREASE (BIT) ×1 IMPLANT
BLADE CLIPPER SURG (BLADE) IMPLANT
BLADE SURG 11 STRL SS (BLADE) ×2 IMPLANT
BUR MATCHSTICK NEURO 3.0 LAGG (BURR) ×2 IMPLANT
BUR PRECISION FLUTE 5.0 (BURR) ×2 IMPLANT
CANISTER SUCT 3000ML PPV (MISCELLANEOUS) ×2 IMPLANT
CARTRIDGE OIL MAESTRO DRILL (MISCELLANEOUS) ×1 IMPLANT
CONT SPEC 4OZ CLIKSEAL STRL BL (MISCELLANEOUS) ×2 IMPLANT
COVER BACK TABLE 60X90IN (DRAPES) ×2 IMPLANT
COVER WAND RF STERILE (DRAPES) ×2 IMPLANT
DECANTER SPIKE VIAL GLASS SM (MISCELLANEOUS) ×2 IMPLANT
DERMABOND ADVANCED (GAUZE/BANDAGES/DRESSINGS) ×1
DERMABOND ADVANCED .7 DNX12 (GAUZE/BANDAGES/DRESSINGS) ×1 IMPLANT
DEVICE INTERBODY ELEVATE 23X8 (Cage) ×4 IMPLANT
DIFFUSER DRILL AIR PNEUMATIC (MISCELLANEOUS) ×2 IMPLANT
DRAPE C-ARM 42X72 X-RAY (DRAPES) ×2 IMPLANT
DRAPE C-ARMOR (DRAPES) ×2 IMPLANT
DRAPE LAPAROTOMY 100X72X124 (DRAPES) ×2 IMPLANT
DRAPE SURG 17X23 STRL (DRAPES) ×2 IMPLANT
DRSG OPSITE POSTOP 4X6 (GAUZE/BANDAGES/DRESSINGS) ×1 IMPLANT
DURAPREP 26ML APPLICATOR (WOUND CARE) ×2 IMPLANT
ELECT REM PT RETURN 9FT ADLT (ELECTROSURGICAL) ×2
ELECTRODE REM PT RTRN 9FT ADLT (ELECTROSURGICAL) ×1 IMPLANT
GAUZE 4X4 16PLY RFD (DISPOSABLE) IMPLANT
GAUZE SPONGE 4X4 12PLY STRL (GAUZE/BANDAGES/DRESSINGS) IMPLANT
GLOVE BIO SURGEON STRL SZ7.5 (GLOVE) IMPLANT
GLOVE BIOGEL PI IND STRL 7.5 (GLOVE) ×2 IMPLANT
GLOVE BIOGEL PI INDICATOR 7.5 (GLOVE) ×2
GLOVE ECLIPSE 7.0 STRL STRAW (GLOVE) ×4 IMPLANT
GLOVE EXAM NITRILE XL STR (GLOVE) IMPLANT
GOWN STRL REUS W/ TWL LRG LVL3 (GOWN DISPOSABLE) ×4 IMPLANT
GOWN STRL REUS W/ TWL XL LVL3 (GOWN DISPOSABLE) IMPLANT
GOWN STRL REUS W/TWL 2XL LVL3 (GOWN DISPOSABLE) IMPLANT
GOWN STRL REUS W/TWL LRG LVL3 (GOWN DISPOSABLE) ×8
GOWN STRL REUS W/TWL XL LVL3 (GOWN DISPOSABLE)
GRAFT DURAGEN MATRIX 1WX1L (Tissue) ×1 IMPLANT
HEMOSTAT POWDER KIT SURGIFOAM (HEMOSTASIS) ×2 IMPLANT
KIT BASIN OR (CUSTOM PROCEDURE TRAY) ×2 IMPLANT
KIT INFUSE XX SMALL 0.7CC (Orthopedic Implant) ×1 IMPLANT
KIT POSITION SURG JACKSON T1 (MISCELLANEOUS) ×2 IMPLANT
KIT TURNOVER KIT B (KITS) ×2 IMPLANT
MILL MEDIUM DISP (BLADE) ×2 IMPLANT
NDL HYPO 18GX1.5 BLUNT FILL (NEEDLE) IMPLANT
NDL SPNL 18GX3.5 QUINCKE PK (NEEDLE) IMPLANT
NEEDLE HYPO 18GX1.5 BLUNT FILL (NEEDLE) IMPLANT
NEEDLE HYPO 22GX1.5 SAFETY (NEEDLE) ×2 IMPLANT
NEEDLE SPNL 18GX3.5 QUINCKE PK (NEEDLE) ×2 IMPLANT
NS IRRIG 1000ML POUR BTL (IV SOLUTION) ×2 IMPLANT
OIL CARTRIDGE MAESTRO DRILL (MISCELLANEOUS) ×2
PACK LAMINECTOMY NEURO (CUSTOM PROCEDURE TRAY) ×2 IMPLANT
PAD ARMBOARD 7.5X6 YLW CONV (MISCELLANEOUS) ×6 IMPLANT
ROD 40MM (Rod) ×4 IMPLANT
ROD SPNL CVD 40X4.75X (Rod) IMPLANT
SCREW 5.5X35MM (Screw) ×8 IMPLANT
SCREW BN 35X5.5XMA NS SPNE (Screw) IMPLANT
SCREW SET SOLERA (Screw) ×8 IMPLANT
SCREW SET SOLERA TI (Screw) IMPLANT
SEALANT ADHERUS EXTEND TIP (MISCELLANEOUS) ×1 IMPLANT
SPACER SPNL XLORDOTIC 23X8X (Cage) IMPLANT
SPCR SPNL XLORDOTIC 23X8X (Cage) ×2 IMPLANT
SPONGE LAP 4X18 RFD (DISPOSABLE) IMPLANT
SPONGE SURGIFOAM ABS GEL 100 (HEMOSTASIS) ×1 IMPLANT
STRIP CLOSURE SKIN 1/2X4 (GAUZE/BANDAGES/DRESSINGS) IMPLANT
SUT VIC AB 0 CT1 18XCR BRD8 (SUTURE) ×1 IMPLANT
SUT VIC AB 0 CT1 8-18 (SUTURE) ×4
SUT VICRYL 3-0 RB1 18 ABS (SUTURE) ×3 IMPLANT
SYR 3ML LL SCALE MARK (SYRINGE) ×7 IMPLANT
TOWEL GREEN STERILE (TOWEL DISPOSABLE) ×2 IMPLANT
TOWEL GREEN STERILE FF (TOWEL DISPOSABLE) ×2 IMPLANT
TRAY FOLEY MTR SLVR 16FR STAT (SET/KITS/TRAYS/PACK) ×2 IMPLANT
WATER STERILE IRR 1000ML POUR (IV SOLUTION) ×2 IMPLANT

## 2018-11-07 NOTE — Evaluation (Signed)
Physical Therapy Evaluation Patient Details Name: Dorothy Robbins MRN: [id number] DOB: 1950-07-13 Today's Date: 11/07/2018   History of Present Illness  Pt is a 68 y/o female s/p L4-5 PLIF. PMH includes HTN and DM.   Clinical Impression  Patient is s/p above surgery resulting in the deficits listed below (see PT Problem List). Pt guarded during gait, and required min guard A for mobility. Reviewed back precautions and generalized walking program. Patient will benefit from skilled PT to increase their independence and safety with mobility (while adhering to their precautions) to allow discharge to the venue listed below.     Follow Up Recommendations No PT follow up;Supervision for mobility/OOB    Equipment Recommendations  None recommended by PT    Recommendations for Other Services       Precautions / Restrictions Precautions Precautions: Back Precaution Booklet Issued: Yes (comment) Precaution Comments: Reviewed back precautions with pt.  Required Braces or Orthoses: Spinal Brace Spinal Brace: Lumbar corset Restrictions Weight Bearing Restrictions: No      Mobility  Bed Mobility Overal bed mobility: Needs Assistance Bed Mobility: Rolling;Sidelying to Sit;Sit to Sidelying Rolling: Min guard Sidelying to sit: Min guard     Sit to sidelying: Min guard General bed mobility comments: Min guard to ensure log roll technique. Cues for sequencing.   Transfers Overall transfer level: Needs assistance Equipment used: Straight cane Transfers: Sit to/from Stand Sit to Stand: Min guard         General transfer comment: Min guard for safety.   Ambulation/Gait Ambulation/Gait assistance: Min guard Gait Distance (Feet): 300 Feet Assistive device: Straight cane Gait Pattern/deviations: Step-through pattern;Decreased stride length Gait velocity: Decreased    General Gait Details: Slow, cautious gait. Mild unsteadiness noted. Min guard for safety. Educated about generalized  walking program to perform at home.   Stairs            Wheelchair Mobility    Modified Rankin (Stroke Patients Only)       Balance Overall balance assessment: Needs assistance Sitting-balance support: No upper extremity supported;Feet supported Sitting balance-Leahy Scale: Good     Standing balance support: Single extremity supported;No upper extremity supported;During functional activity Standing balance-Leahy Scale: Fair Standing balance comment: Able to maintain static standing without UE support                              Pertinent Vitals/Pain Pain Assessment: Faces Faces Pain Scale: Hurts little more Pain Location: back Pain Descriptors / Indicators: Aching;Operative site guarding Pain Intervention(s): Limited activity within patient's tolerance;Monitored during session;Repositioned    Home Living Family/patient expects to be discharged to:: Private residence Living Arrangements: Spouse/significant other Available Help at Discharge: Family;Available 24 hours/day Type of Home: House Home Access: Stairs to enter Entrance Stairs-Rails: None Entrance Stairs-Number of Steps: 3 Home Layout: Two level Home Equipment: Cane - single point      Prior Function Level of Independence: Independent with assistive device(s)         Comments: Was using cane prior to admission.      Hand Dominance        Extremity/Trunk Assessment   Upper Extremity Assessment Upper Extremity Assessment: Defer to OT evaluation    Lower Extremity Assessment Lower Extremity Assessment: Generalized weakness;LLE deficits/detail LLE Deficits / Details: Reports some numbness in toes in LLE     Cervical / Trunk Assessment Cervical / Trunk Assessment: Other exceptions Cervical / Trunk Exceptions: s/p PLIF  Communication   Communication: No difficulties  Cognition Arousal/Alertness: Awake/alert Behavior During Therapy: WFL for tasks assessed/performed Overall  Cognitive Status: Within Functional Limits for tasks assessed                                        General Comments      Exercises     Assessment/Plan    PT Assessment Patient needs continued PT services  PT Problem List Decreased strength;Decreased balance;Decreased mobility;Decreased knowledge of use of DME;Decreased knowledge of precautions;Decreased activity tolerance;Pain       PT Treatment Interventions DME instruction;Stair training;Gait training;Functional mobility training;Therapeutic activities;Therapeutic exercise;Balance training;Patient/family education    PT Goals (Current goals can be found in the Care Plan section)  Acute Rehab PT Goals Patient Stated Goal: to go home  PT Goal Formulation: With patient Time For Goal Achievement: 11/21/18 Potential to Achieve Goals: Good    Frequency Min 5X/week   Barriers to discharge        Co-evaluation               AM-PAC PT "6 Clicks" Mobility  Outcome Measure Help needed turning from your back to your side while in a flat bed without using bedrails?: A Little Help needed moving from lying on your back to sitting on the side of a flat bed without using bedrails?: A Little Help needed moving to and from a bed to a chair (including a wheelchair)?: A Little Help needed standing up from a chair using your arms (e.g., wheelchair or bedside chair)?: A Little Help needed to walk in hospital room?: A Little Help needed climbing 3-5 steps with a railing? : A Lot 6 Click Score: 17    End of Session Equipment Utilized During Treatment: Gait belt;Back brace Activity Tolerance: Patient tolerated treatment well Patient left: in bed;with call bell/phone within reach Nurse Communication: Mobility status PT Visit Diagnosis: Other abnormalities of gait and mobility (R26.89);Pain Pain - part of body: (back)    Time: 1540-1600 PT Time Calculation (min) (ACUTE ONLY): 20 min   Charges:   PT  Evaluation $PT Eval Low Complexity: [place], PT, DPT  Acute Rehabilitation Services  Pager: [phone] Office: [phone]   Rudean Hitt 11/07/2018, 5:44 PM

## 2018-11-07 NOTE — Anesthesia Postprocedure Evaluation (Signed)
Anesthesia Post Note  Patient: Dorothy Robbins  Procedure(s) Performed: POSTERIOR LUMBAR INTERBODY FUSION LUMBAR FOUR- LUMBAR FIVE, POSTERIOR NON-SEGMENTAL INSTRUMENTATION (N/A )     Patient location during evaluation: PACU Anesthesia Type: General Level of consciousness: awake Pain management: pain level controlled Vital Signs Assessment: post-procedure vital signs reviewed and stable Respiratory status: spontaneous breathing Cardiovascular status: stable Postop Assessment: no apparent nausea or vomiting Anesthetic complications: no    Last Vitals:  Vitals:   11/07/18 1300 11/07/18 1322  BP: 114/75 103/82  Pulse: 66 70  Resp: 12 16  Temp: (!) 36.2 C 36.7 C  SpO2: 100% 98%    Last Pain:  Vitals:   11/07/18 1322  TempSrc: Oral  PainSc:    Pain Goal: Patients Stated Pain Goal: 2 (11/07/18 1150)  LLE Motor Response: Purposeful movement (11/07/18 1325) LLE Sensation: Full sensation (11/07/18 1325) RLE Motor Response: Purposeful movement (11/07/18 1325) RLE Sensation: Full sensation (11/07/18 1325)        Huston Foley

## 2018-11-07 NOTE — Transfer of Care (Signed)
Immediate Anesthesia Transfer of Care Note  Patient: Dorothy Robbins  Procedure(s) Performed: POSTERIOR LUMBAR INTERBODY FUSION LUMBAR FOUR- LUMBAR FIVE, POSTERIOR NON-SEGMENTAL INSTRUMENTATION (N/A )  Patient Location: PACU  Anesthesia Type:General  Level of Consciousness: awake and alert   Airway & Oxygen Therapy: Patient Spontanous Breathing and Patient connected to nasal cannula oxygen  Post-op Assessment: Report given to RN and Post -op Vital signs reviewed and stable  Post vital signs: Reviewed and stable  Last Vitals:  Vitals Value Taken Time  BP 109/89 11/07/18 1129  Temp    Pulse 108 11/07/18 1132  Resp 10 11/07/18 1132  SpO2 92 % 11/07/18 1132  Vitals shown include unvalidated device data.  Last Pain:  Vitals:   11/07/18 0553  TempSrc: Oral  PainSc:       Patients Stated Pain Goal: 1 (74/25/52 5894)  Complications: No apparent anesthesia complications

## 2018-11-07 NOTE — Op Note (Signed)
NEUROSURGERY OPERATIVE NOTE   PREOP DIAGNOSIS:  1. Lumbar spinal stenosis, L4-5 2. Spondylolisthesis, L4-5  POSTOP DIAGNOSIS: Same  PROCEDURE: 1. L4 laminectomy with facetectomy for decompression of exiting nerve roots, more than would be required for placement of interbody graft 2. Placement of anterior interbody device - [employer] expandable 72mm cage x 2 3. Posterior non-segmental instrumentation using cortical pedicle screws at L4 - L5 4. Interbody arthrodesis, L4-5 5. Use of locally harvested bone autograft 6. Use of non-structural bone allograft - rhBMP  SURGEON: Dr. Consuella Lose, MD  ASSISTANT: Ferne Reus, PA-C  ANESTHESIA: General Endotracheal  EBL: 175cc  SPECIMENS: None  DRAINS: None  COMPLICATIONS: None immediate  CONDITION: Hemodynamically stable to PACU  HISTORY: Dorothy Robbins is a 68 y.o. female who has been followed in the outpatient clinic with back and bilateral leg pain related to multifactorial stenosis at L4-5 with degenerative spondylolisthesis.  This includes a large left eccentric disc herniation.  Multiple conservative treatments were attempted without significant improvement and we ultimately elected to proceed with surgical decompression and fusion. Risks, benefits, and alternative treatments were reviewed in detail in the office. After all questions were answered, informed consent was obtained and witnessed.  PROCEDURE IN DETAIL: The patient was brought to the operating room via stretcher. After induction of general anesthesia, the patient was positioned on the operative table in the prone position. All pressure points were meticulously padded. Incision was then marked out and prepped and draped in the usual sterile fashion.  After timeout was conducted, skin was infiltrated with local anesthetic. Skin incision was then made sharply and Bovie electrocautery was used to dissect the subcutaneous tissue until the lumbodorsal fascia was  identified and incised. The muscle was then elevated in the subperiosteal plane and the L4, and L5 lamina and L4-5 facet complexes were identified. Self-retaining retractors were then placed. Lateral fluoroscopy was taken with a dissector in the L4-5 interspace to confirm our location.  At this point attention was turned to decompression. Complete  L4 laminectomy was completed with a high-speed drill and Kerrison punches.  Normal dura was identified.  A ball-tipped dissector was then used to identify the foramina bilaterally.  High-speed drill was used to cut across the pars interarticularis and the inferior articulating process of L4 was removed bilaterally.    During removal of the right-sided inferior articulating process, I noted some flow of CSF.  Inspection of the dura revealed a small durotomy on the contralateral side in the right, on the lateral aspect of the dura.  This was covered with a cottonoid.  The left lateral aspect of the dura was noted to be significantly adherent to the left L4 and left L5 pedicles and required sharp dissection in order to dissect out the ligament.  This was then removed with Kerrison punches.  The exiting nerve roots and the traversing nerve roots were then identified.    There was a significant amount of calcified ligament overlying the exiting nerve roots bilaterally with compression related to this as well as the spondylolisthesis.  Once the ligament was removed, I was able to freely pass a ball-tipped dissector out the L4-5 foramen as well as at the L5-S1 foramen indicating good decompression.  I did also identify the large slightly superiorly migrated disc herniation on the left.  Again, ball-tipped dissector was used to dissect in the epidural space in order to free up the ventral dura.    Disc space was then identified, incised bilaterally, and using a combination  of shavers, curettes and rongeurs, complete discectomy was completed.  This included use of down angled  curettes in order to remove the left eccentric disc herniation.  Once this was done, I was able to pass a long ball-tipped dissector in the ventral epidural space indicating good decompression of the thecal sac.  Endplates were prepared, and bone harvested during decompression was mixed with BMP and packed into the interspace. A 8 mm expandable cage was tapped into place and expanded maximally to 12 mm bilaterally.  Good position was confirmed with fluoroscopy.  At this point attention was turned to closure of the durotomy.  6-0 Prolene stitch was placed across the small durotomy.  This was able to significantly decrease the egress of CSF.  At this point, the entry points for bilateral L4 and L5 cortical pedicle screws were identified using standard anatomic landmarks and lateral fluoro. Pilot holes were then drilled and tapped to 5.5 x 35 mm. Screws were then placed in L4 and L5 bilaterally. Prebent lordotic rod was then sized and placed into the pedicle screws. Set screws were placed and final tightened. Final AP and lateral fluoroscopic images confirmed good position.  At this point a 1 x 1 inch piece of collagen onlay graft was placed over the durotomy.  This was covered with polyethylene glycol dural sealant.  Valsalva maneuver was performed and no further CSF leak was identified. Hemostasis was secured and confirmed with bipolar cautery and morcellized gelfoam with thrombin. The wound was then irrigated with copious amounts of antibiotic saline, then closed in standard fashion using a combination of interrupted 0 and 3-0 Vicryl stitches in the muscular, fascial, and subcutaneous layers. Skin was then closed using standard Dermabond. Sterile dressing was then applied. The patient was then transferred to the stretcher, extubated, and taken to the postanesthesia care unit in stable hemodynamic condition.  At the end of the case all sponge, needle, cottonoid, and instrument counts were correct.

## 2018-11-07 NOTE — Anesthesia Procedure Notes (Signed)
Procedure Name: Intubation Date/Time: 11/07/2018 8:06 AM Performed by: Inda Coke, CRNA Pre-anesthesia Checklist: Patient identified, Emergency Drugs available, Suction available and Patient being monitored Patient Re-evaluated:Patient Re-evaluated prior to induction Oxygen Delivery Method: Circle System Utilized Preoxygenation: Pre-oxygenation with 100% oxygen Induction Type: IV induction Ventilation: Mask ventilation without difficulty and Oral airway inserted - appropriate to patient size Laryngoscope Size: Mac and 3 Grade View: Grade I Tube type: Oral Number of attempts: 1 Airway Equipment and Method: Stylet and Oral airway Placement Confirmation: ETT inserted through vocal cords under direct vision,  positive ETCO2 and breath sounds checked- equal and bilateral Secured at: 22 cm Tube secured with: Tape Dental Injury: Teeth and Oropharynx as per pre-operative assessment

## 2018-11-07 NOTE — Progress Notes (Signed)
Orthopedic Tech Progress Note Patient Details:  NERY KALISZ May 26, 1950 [id number]  Patient ID: [name], female   DOB: 13-Dec-1950, 68 y.o.   MRN: [id number]   [place] Pink 11/07/2018, 12:16 PMCalled [employer] for lumbar brace.

## 2018-11-08 LAB — GLUCOSE, CAPILLARY: Glucose-Capillary: 150 mg/dL — ABNORMAL HIGH (ref 70–99)

## 2018-11-08 MED ORDER — METHOCARBAMOL 500 MG PO TABS: 500.0000 mg | ORAL_TABLET | Freq: Four times a day (QID) | ORAL | 3 refills | Status: DC | PRN

## 2018-11-08 MED ORDER — HYDROCODONE-ACETAMINOPHEN 10-325 MG PO TABS: 2.0000 | ORAL_TABLET | ORAL | 0 refills | Status: DC | PRN

## 2018-11-08 NOTE — Evaluation (Signed)
Occupational Therapy Evaluation Patient Details Name: BOBBY RAGAN MRN: [id number] DOB: Jan 25, 1951 Today's Date: 11/08/2018    History of Present Illness Pt is a 68 y/o female s/p L4-5 PLIF. PMH includes HTN and DM.    Clinical Impression   PTA: pt independent. Back handout provided and reviewed adls in detail. Pt educated on: clothing between brace, never sleep in brace, set an alarm at night for medication, avoid sitting for long periods of time, correct bed positioning for sleeping, correct sequence for bed mobility, avoiding lifting more than 5 pounds and never wash directly over incision. All education is complete and patient indicates understanding. Pt simulated tub transfer with supervisionA only. Pt donning pants with reacher and doffing socks with figure 4 technique. Pt wearing slip on shoes for ease. Pt tolerating session well. 3/3 back precautions recalled. No acute OT needs. OT signing off.      Follow Up Recommendations  No OT follow up    Equipment Recommendations  None recommended by OT    Recommendations for Other Services       Precautions / Restrictions Precautions Precautions: Back Precaution Booklet Issued: Yes (comment) Precaution Comments: Reviewed back precautions with pt.  Required Braces or Orthoses: Spinal Brace Spinal Brace: Lumbar corset Restrictions Weight Bearing Restrictions: No      Mobility Bed Mobility Overal bed mobility: Modified Independent             General bed mobility comments: log roll technique education  Transfers Overall transfer level: Needs assistance Equipment used: Straight cane Transfers: Sit to/from Stand Sit to Stand: Modified independent (Device/Increase time)         General transfer comment: uses SPC    Balance Overall balance assessment: Needs assistance Sitting-balance support: No upper extremity supported;Feet supported Sitting balance-Leahy Scale: Good     Standing balance support: Single  extremity supported;No upper extremity supported;During functional activity Standing balance-Leahy Scale: Fair Standing balance comment: Able to maintain static standing without UE support                            ADL either performed or assessed with clinical judgement   ADL Overall ADL's : At baseline                                       General ADL Comments: Pt education provided for LB AE. pt donning pants with reacher and socks with figure four technique. Pt performing tub transfer simulation. Pt performing tasks with increased confidence.     Vision Baseline Vision/History: No visual deficits Vision Assessment?: No apparent visual deficits     Perception     Praxis      Pertinent Vitals/Pain Faces Pain Scale: Hurts little more Pain Location: back Pain Descriptors / Indicators: Aching;Operative site guarding     Hand Dominance     Extremity/Trunk Assessment Upper Extremity Assessment Upper Extremity Assessment: Overall WFL for tasks assessed   Lower Extremity Assessment Lower Extremity Assessment: Overall WFL for tasks assessed   Cervical / Trunk Assessment Cervical / Trunk Assessment: Other exceptions Cervical / Trunk Exceptions: s/p PLIF    Communication Communication Communication: No difficulties   Cognition Arousal/Alertness: Awake/alert Behavior During Therapy: WFL for tasks assessed/performed Overall Cognitive Status: Within Functional Limits for tasks assessed  General Comments       Exercises     Shoulder Instructions      Home Living Family/patient expects to be discharged to:: Private residence Living Arrangements: Spouse/significant other Available Help at Discharge: Family;Available 24 hours/day Type of Home: House Home Access: Stairs to enter [employer] of Steps: 3 Entrance Stairs-Rails: None Home Layout: Two level Alternate Level  Stairs-Number of Steps: flight Alternate Level Stairs-Rails: Left           Home Equipment: Cane - single point;Bedside commode;Grab bars - toilet;Hand held shower head          Prior Functioning/Environment Level of Independence: Independent with assistive device(s)        Comments: Was using cane prior to admission.         OT Problem List: Pain      OT Treatment/Interventions:      OT Goals(Current goals can be found in the care plan section) Acute Rehab OT Goals Patient Stated Goal: to go home  OT Goal Formulation: With patient  OT Frequency:     Barriers to D/C:            Co-evaluation              AM-PAC OT "6 Clicks" Daily Activity     Outcome Measure Help from another person eating meals?: None Help from another person taking care of personal grooming?: None Help from another person toileting, which includes using toliet, bedpan, or urinal?: None Help from another person bathing (including washing, rinsing, drying)?: A Little Help from another person to put on and taking off regular upper body clothing?: None Help from another person to put on and taking off regular lower body clothing?: A Little 6 Click Score: 22   End of Session Equipment Utilized During Treatment: Back brace Nurse Communication: Mobility status  Activity Tolerance: Patient tolerated treatment well Patient left: in chair;with call bell/phone within reach  OT Visit Diagnosis: Pain Pain - part of body: (back)                Time: 0768-0881 OT Time Calculation (min): 30 min Charges:  OT General Charges $OT Visit: 1 Visit OT Evaluation $OT Eval Moderate Complexity: 1 Mod OT Treatments $Self Care/Home Management : 8-22 mins  Darryl Nestle) Marsa Aris OTR/L Acute Rehabilitation Services Pager: [phone] Office: [place] 11/08/2018, 8:54 AM

## 2018-11-08 NOTE — Discharge Summary (Signed)
Physician Discharge Summary  Patient ID: Dorothy Robbins MRN: [id number] DOB/AGE: [birth year] 68 y.o.  Admit date: 11/07/2018 Discharge date: 11/08/2018  Admission Diagnoses: 4 5 spondylolisthesis with lumbar radiculopathy and stenosis  Discharge Diagnoses: L4-5 spondylolisthesis with lumbar radiculopathy and stenosis Active Problems:   Lumbar stenosis with neurogenic claudication   Discharged Condition: good  Hospital Course: Patient was admitted to undergo surgical decompression and arthrodesis at L4-5 which she tolerated well  Consults: None  Significant Diagnostic Studies: None  Treatments: surgery: For laminectomy with decompression of exiting L4 and L5 nerve roots anterior interbody arthrodesis posterior nonsegmental fixation L4-L5 local autograft and allograft with infuse  Discharge Exam: Blood pressure 115/63, pulse 68, temperature 98.4 F (36.9 C), temperature source Oral, resp. rate 18, height 5\' 4"  (1.626 m), weight 86.7 kg, SpO2 95 %. Incision is clean and dry Station and gait are intact  Disposition: Discharge disposition: 01-Home or Self Care       Discharge Instructions    Call MD for:  redness, tenderness, or signs of infection (pain, swelling, redness, odor or green/yellow discharge around incision site)   Complete by: As directed    Call MD for:  severe uncontrolled pain   Complete by: As directed    Call MD for:  temperature >100.4   Complete by: As directed    Diet - low sodium heart healthy   Complete by: As directed    Increase activity slowly   Complete by: As directed      Allergies as of 11/08/2018      Reactions   Sulfa Antibiotics Rash      Medication List    TAKE these medications   acetaminophen 500 MG tablet Commonly known as: TYLENOL Take 1,000 mg by mouth every 8 (eight) hours as needed for mild pain or headache.   aspirin 81 MG chewable tablet Chew 81 mg by mouth daily.   gabapentin 300 MG capsule Commonly known as:  NEURONTIN Take 300 mg by mouth 2 (two) times a day.   HYDROcodone-acetaminophen 10-325 MG tablet Commonly known as: NORCO Take 2 tablets by mouth every 4 (four) hours as needed for severe pain ((score 7 to 10)).   lisinopril-hydrochlorothiazide 20-25 MG tablet Commonly known as: ZESTORETIC Take 1 tablet by mouth at bedtime.   metFORMIN 500 MG 24 hr tablet Commonly known as: GLUCOPHAGE-XR Take 500 mg by mouth daily with breakfast.   methocarbamol 500 MG tablet Commonly known as: ROBAXIN Take 1 tablet (500 mg total) by mouth every 6 (six) hours as needed for muscle spasms.   multivitamin tablet Take 1 tablet by mouth daily.   vitamin C 500 MG tablet Commonly known as: ASCORBIC ACID Take 500 mg by mouth daily.   Vitamin D (Cholecalciferol) 10 MCG (400 UNIT) Tabs Take 400 Units by mouth daily.        Signed: Blanchie Dessert Aydeen Blume 11/08/2018, 9:19 AM

## 2018-11-08 NOTE — Progress Notes (Signed)
Patient is discharged from room 3C02 at this time. Alert and in stable condition. IV site d/c'd and instructions read to patient with understanding verbalized. Left unit via wheelchair with all belongings at side.

## 2018-11-08 NOTE — Plan of Care (Signed)
Patient awaiting discharge.

## 2018-11-08 NOTE — Progress Notes (Signed)
Physical Therapy Treatment Patient Details Name: Dorothy Robbins MRN: [id number] DOB: [birth year] Today's Date: 11/08/2018    History of Present Illness Pt is a 68 y/o female s/p L4-5 PLIF. PMH includes HTN and DM.     PT Comments    Patient progressing well towards PT goals. Tolerated stair training today with supervision for safety and cues for technique. Feels more secure using SPC for ambulation. Reports feeling good today and eager to return home. Pt able to recall 3/3 back precautions but needed cues to adhere to during mobility. Education re: walking program, positioning and reviewed log roll technique. Will follow if still in hospital.    Follow Up Recommendations  No PT follow up;Supervision - Intermittent     Equipment Recommendations  None recommended by PT    Recommendations for Other Services       Precautions / Restrictions Precautions Precautions: Back Precaution Booklet Issued: Yes (comment) Precaution Comments: Able to recall 3/3 precautions Required Braces or Orthoses: Spinal Brace Spinal Brace: Lumbar corset Restrictions Weight Bearing Restrictions: No    Mobility  Bed Mobility Overal bed mobility: Modified Independent             General bed mobility comments: Sitting in chair upon PT arrival.  Transfers Overall transfer level: Needs assistance Equipment used: None Transfers: Sit to/from Stand Sit to Stand: Modified independent (Device/Increase time)         General transfer comment: No assist or issues with balance during standing.  Ambulation/Gait Ambulation/Gait assistance: Supervision Gait Distance (Feet): 350 Feet Assistive device: Straight cane Gait Pattern/deviations: Step-through pattern;Decreased stride length Gait velocity: Decreased    General Gait Details: Slow, cautious gait. Mild unsteadiness noted but no overt LOB using SPC. Supervision for safety.   Stairs Stairs: Yes Stairs assistance: Supervision Stair  Management: Alternating pattern;Step to pattern;One rail Right Number of Stairs: 13 General stair comments: Cues for technique and safety.   Wheelchair Mobility    Modified Rankin (Stroke Patients Only)       Balance Overall balance assessment: Needs assistance Sitting-balance support: No upper extremity supported;Feet supported Sitting balance-Leahy Scale: Good     Standing balance support: During functional activity;No upper extremity supported;Single extremity supported Standing balance-Leahy Scale: Fair Standing balance comment: Able to maintain static standing without UE support but feels better with UE support for dynamic tasks.                            Cognition Arousal/Alertness: Awake/alert Behavior During Therapy: WFL for tasks assessed/performed Overall Cognitive Status: Within Functional Limits for tasks assessed                                        Exercises      General Comments        Pertinent Vitals/Pain Pain Assessment: Faces Faces Pain Scale: Hurts a little bit Pain Location: back Pain Descriptors / Indicators: Aching;Operative site guarding Pain Intervention(s): Repositioned;Monitored during session    [place] expects to be discharged to:: Private residence Living Arrangements: Spouse/significant other Available Help at Discharge: Family;Available 24 hours/day Type of Home: House Home Access: Stairs to enter Entrance Stairs-Rails: None Home Layout: Two level Home Equipment: Cane - single point;Bedside commode;Grab bars - toilet;Hand held shower head      Prior Function Level of Independence: Independent with assistive device(s)  Comments: Was using cane prior to admission.    PT Goals (current goals can now be found in the care plan section) Acute Rehab PT Goals Patient Stated Goal: to go home  Progress towards PT goals: Progressing toward goals    Frequency    Min  5X/week      PT Plan Current plan remains appropriate    Co-evaluation              AM-PAC PT "6 Clicks" Mobility   Outcome Measure  Help needed turning from your back to your side while in a flat bed without using bedrails?: A Little Help needed moving from lying on your back to sitting on the side of a flat bed without using bedrails?: A Little Help needed moving to and from a bed to a chair (including a wheelchair)?: None Help needed standing up from a chair using your arms (e.g., wheelchair or bedside chair)?: None Help needed to walk in hospital room?: A Little Help needed climbing 3-5 steps with a railing? : A Little 6 Click Score: 20    End of Session Equipment Utilized During Treatment: Gait belt;Back brace Activity Tolerance: Patient tolerated treatment well Patient left: in chair;with call bell/phone within reach Nurse Communication: Mobility status PT Visit Diagnosis: Other abnormalities of gait and mobility (R26.89);Pain Pain - part of body: (back)     Time: 2951-8841 PT Time Calculation (min) (ACUTE ONLY): 14 min  Charges:  $Gait Training: 8-22 mins                     Wray Kearns, PT, DPT Acute Rehabilitation Services Pager [phone] Office [phone]       [place] 11/08/2018, 9:25 AM

## 2018-11-08 NOTE — Discharge Instructions (Signed)
Wound Care  °You may remove outer bandage after 2 days and shower.  °Keep incision open to air. °Do not put any creams, lotions, or ointments on incision. ° °Activity °Walk each and every day, increasing distance each day. °No lifting greater than 5 lbs.  Avoid excessive neck motion. °No driving for 2 weeks; may ride as a passenger locally. °Wear neck brace at all times except when showering. °Diet °Resume your normal diet.  °Return to Work °Will be discussed at you follow up appointment. °Call Your Doctor If Any of These Occur °Redness, drainage, or swelling at the wound.  °Temperature greater than 101 degrees. °Severe pain not relieved by pain medication. °Increased difficulty swallowing.  °Incision starts to come apart. °Follow Up Appt °Call today and ask for appointment in 1-2 weeks ([phone]) or for problems.  If you have any hardware placed in your spine, you will need an x-ray before your appointment. ° °

## 2018-11-26 DIAGNOSIS — I10 Essential (primary) hypertension: Secondary | ICD-10-CM | POA: Diagnosis not present

## 2018-11-26 DIAGNOSIS — Z6833 Body mass index (BMI) 33.0-33.9, adult: Secondary | ICD-10-CM | POA: Diagnosis not present

## 2018-11-26 DIAGNOSIS — M4316 Spondylolisthesis, lumbar region: Secondary | ICD-10-CM | POA: Diagnosis not present

## 2018-12-19 DIAGNOSIS — E785 Hyperlipidemia, unspecified: Secondary | ICD-10-CM | POA: Diagnosis not present

## 2018-12-19 DIAGNOSIS — I10 Essential (primary) hypertension: Secondary | ICD-10-CM | POA: Diagnosis not present

## 2018-12-23 ENCOUNTER — Ambulatory Visit (HOSPITAL_COMMUNITY): Payer: Medicare Other

## 2019-01-12 ENCOUNTER — Ambulatory Visit (HOSPITAL_COMMUNITY)
Admission: RE | Admit: 2019-01-12 | Discharge: 2019-01-12 | Disposition: A | Discharge status: Home / Self Care | Payer: Medicare Other | Source: Ambulatory Visit | Attending: Otolaryngology | Admitting: Otolaryngology

## 2019-01-12 ENCOUNTER — Other Ambulatory Visit: Payer: Self-pay

## 2019-01-12 DIAGNOSIS — E041 Nontoxic single thyroid nodule: Secondary | ICD-10-CM | POA: Insufficient documentation

## 2019-01-12 DIAGNOSIS — E042 Nontoxic multinodular goiter: Secondary | ICD-10-CM | POA: Diagnosis not present

## 2019-01-19 ENCOUNTER — Ambulatory Visit (INDEPENDENT_AMBULATORY_CARE_PROVIDER_SITE_OTHER): Payer: Medicare Other | Admitting: Otolaryngology

## 2019-01-19 DIAGNOSIS — D440 Neoplasm of uncertain behavior of thyroid gland: Secondary | ICD-10-CM | POA: Diagnosis not present

## 2019-01-19 DIAGNOSIS — E1165 Type 2 diabetes mellitus with hyperglycemia: Secondary | ICD-10-CM | POA: Diagnosis not present

## 2019-01-19 DIAGNOSIS — I10 Essential (primary) hypertension: Secondary | ICD-10-CM | POA: Diagnosis not present

## 2019-01-19 DIAGNOSIS — E7800 Pure hypercholesterolemia, unspecified: Secondary | ICD-10-CM | POA: Diagnosis not present

## 2019-03-25 DIAGNOSIS — M4316 Spondylolisthesis, lumbar region: Secondary | ICD-10-CM | POA: Diagnosis not present

## 2019-04-01 DIAGNOSIS — I10 Essential (primary) hypertension: Secondary | ICD-10-CM | POA: Diagnosis not present

## 2019-04-01 DIAGNOSIS — E7800 Pure hypercholesterolemia, unspecified: Secondary | ICD-10-CM | POA: Diagnosis not present

## 2019-04-01 DIAGNOSIS — E041 Nontoxic single thyroid nodule: Secondary | ICD-10-CM | POA: Diagnosis not present

## 2019-04-01 DIAGNOSIS — E1165 Type 2 diabetes mellitus with hyperglycemia: Secondary | ICD-10-CM | POA: Diagnosis not present

## 2019-04-06 DIAGNOSIS — E1165 Type 2 diabetes mellitus with hyperglycemia: Secondary | ICD-10-CM | POA: Diagnosis not present

## 2019-04-06 DIAGNOSIS — Z6834 Body mass index (BMI) 34.0-34.9, adult: Secondary | ICD-10-CM | POA: Diagnosis not present

## 2019-04-06 DIAGNOSIS — I10 Essential (primary) hypertension: Secondary | ICD-10-CM | POA: Diagnosis not present

## 2019-04-06 DIAGNOSIS — R946 Abnormal results of thyroid function studies: Secondary | ICD-10-CM | POA: Diagnosis not present

## 2019-05-25 DIAGNOSIS — U071 COVID-19: Secondary | ICD-10-CM | POA: Diagnosis not present

## 2019-06-08 DIAGNOSIS — R05 Cough: Secondary | ICD-10-CM | POA: Diagnosis not present

## 2019-09-02 DIAGNOSIS — D225 Melanocytic nevi of trunk: Secondary | ICD-10-CM | POA: Diagnosis not present

## 2019-09-02 DIAGNOSIS — Z85828 Personal history of other malignant neoplasm of skin: Secondary | ICD-10-CM | POA: Diagnosis not present

## 2019-09-02 DIAGNOSIS — Z808 Family history of malignant neoplasm of other organs or systems: Secondary | ICD-10-CM | POA: Diagnosis not present

## 2019-09-02 DIAGNOSIS — L821 Other seborrheic keratosis: Secondary | ICD-10-CM | POA: Diagnosis not present

## 2019-09-09 DIAGNOSIS — Z0120 Encounter for dental examination and cleaning without abnormal findings: Secondary | ICD-10-CM | POA: Diagnosis not present

## 2019-09-25 DIAGNOSIS — R945 Abnormal results of liver function studies: Secondary | ICD-10-CM | POA: Diagnosis not present

## 2019-09-25 DIAGNOSIS — E7800 Pure hypercholesterolemia, unspecified: Secondary | ICD-10-CM | POA: Diagnosis not present

## 2019-09-25 DIAGNOSIS — R946 Abnormal results of thyroid function studies: Secondary | ICD-10-CM | POA: Diagnosis not present

## 2019-09-25 DIAGNOSIS — I10 Essential (primary) hypertension: Secondary | ICD-10-CM | POA: Diagnosis not present

## 2019-09-30 DIAGNOSIS — G629 Polyneuropathy, unspecified: Secondary | ICD-10-CM | POA: Diagnosis not present

## 2019-09-30 DIAGNOSIS — E1165 Type 2 diabetes mellitus with hyperglycemia: Secondary | ICD-10-CM | POA: Diagnosis not present

## 2019-09-30 DIAGNOSIS — R946 Abnormal results of thyroid function studies: Secondary | ICD-10-CM | POA: Diagnosis not present

## 2019-09-30 DIAGNOSIS — I10 Essential (primary) hypertension: Secondary | ICD-10-CM | POA: Diagnosis not present

## 2019-10-28 DIAGNOSIS — M4316 Spondylolisthesis, lumbar region: Secondary | ICD-10-CM | POA: Diagnosis not present

## 2019-10-28 DIAGNOSIS — Z6830 Body mass index (BMI) 30.0-30.9, adult: Secondary | ICD-10-CM | POA: Diagnosis not present

## 2019-10-28 DIAGNOSIS — I10 Essential (primary) hypertension: Secondary | ICD-10-CM | POA: Diagnosis not present

## 2019-10-29 DIAGNOSIS — N390 Urinary tract infection, site not specified: Secondary | ICD-10-CM | POA: Diagnosis not present

## 2019-10-29 DIAGNOSIS — Z6830 Body mass index (BMI) 30.0-30.9, adult: Secondary | ICD-10-CM | POA: Diagnosis not present

## 2019-11-02 DIAGNOSIS — Z1231 Encounter for screening mammogram for malignant neoplasm of breast: Secondary | ICD-10-CM | POA: Diagnosis not present

## 2019-11-18 DIAGNOSIS — D485 Neoplasm of uncertain behavior of skin: Secondary | ICD-10-CM | POA: Diagnosis not present

## 2019-11-18 DIAGNOSIS — L570 Actinic keratosis: Secondary | ICD-10-CM | POA: Diagnosis not present

## 2019-11-26 DIAGNOSIS — R3915 Urgency of urination: Secondary | ICD-10-CM | POA: Diagnosis not present

## 2019-12-20 IMAGING — US US THYROID
1 series · 13 of 25 positions shown · non-contrast
Comparison: 10/03/2017, 10/17/2017

CLINICAL DATA: Thyroid nodules, previous right thyroid nodule
biopsy 10/17/2017

EXAM:
THYROID ULTRASOUND
TECHNIQUE: Ultrasound examination of the thyroid gland and adjacent soft
tissues was performed.

[Series 1: us thyroid · 0.06mm/px · 80 acquisitions, 13 frames shown]
[im 1/80]
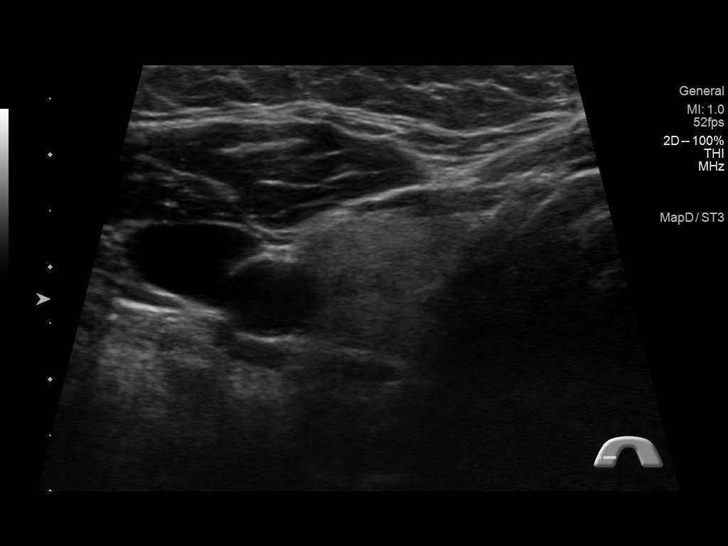
[im 7/80]
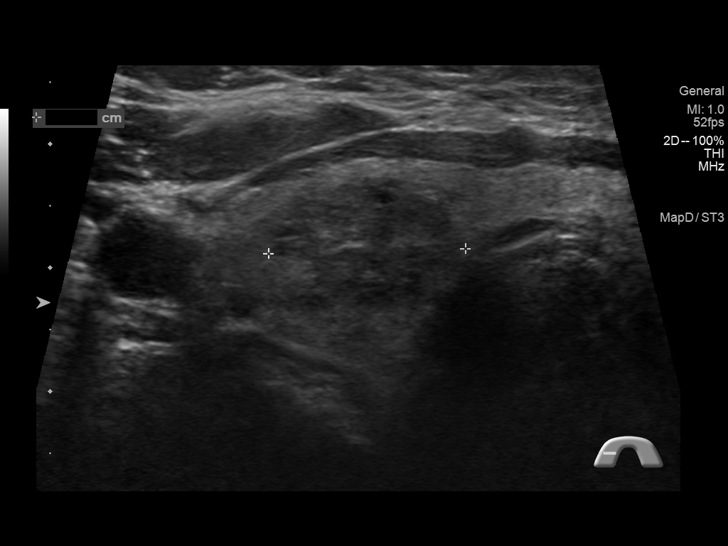
[im 14/80]
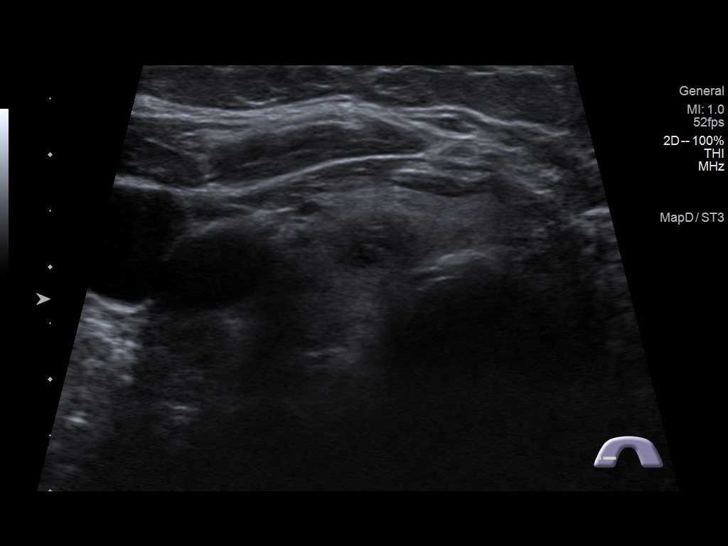
[im 20/80]
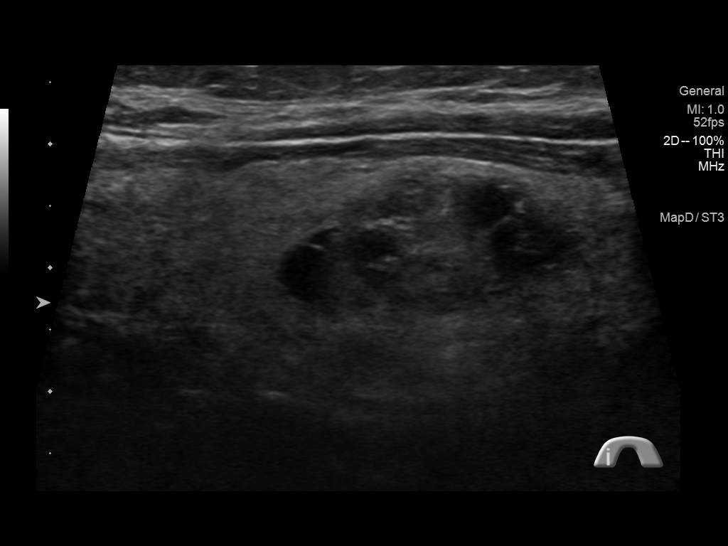
[im 27/80]
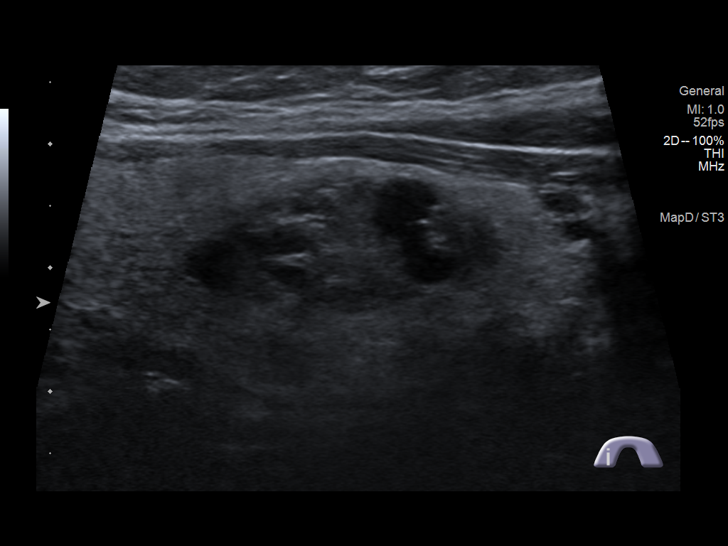
[im 33/80]
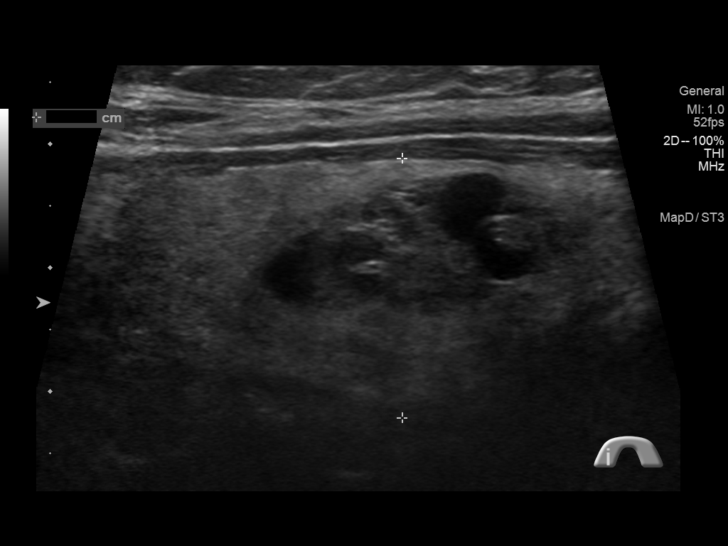
[im 40/80]
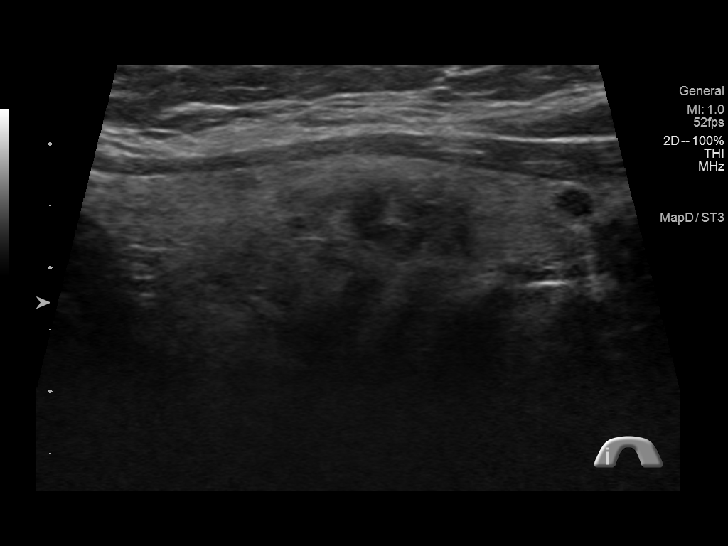
[im 47/80]
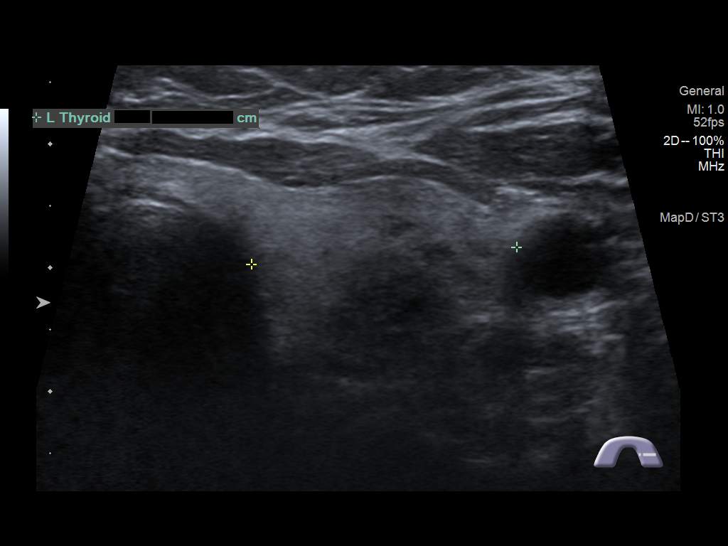
[im 53/80]
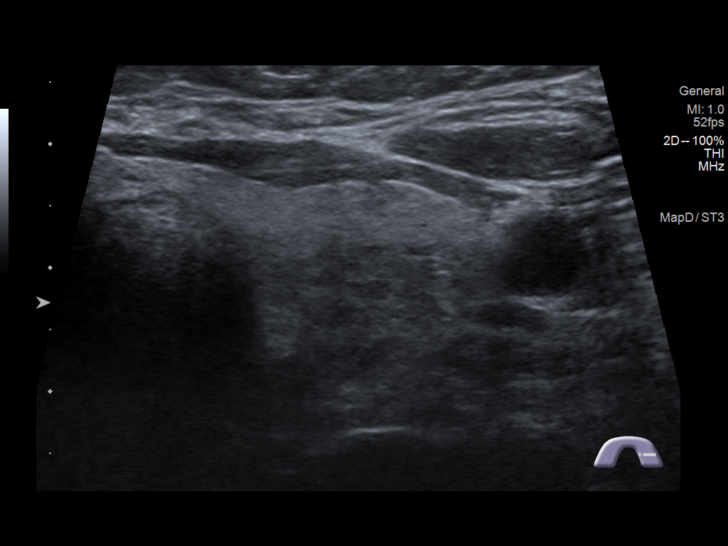
[im 60/80]
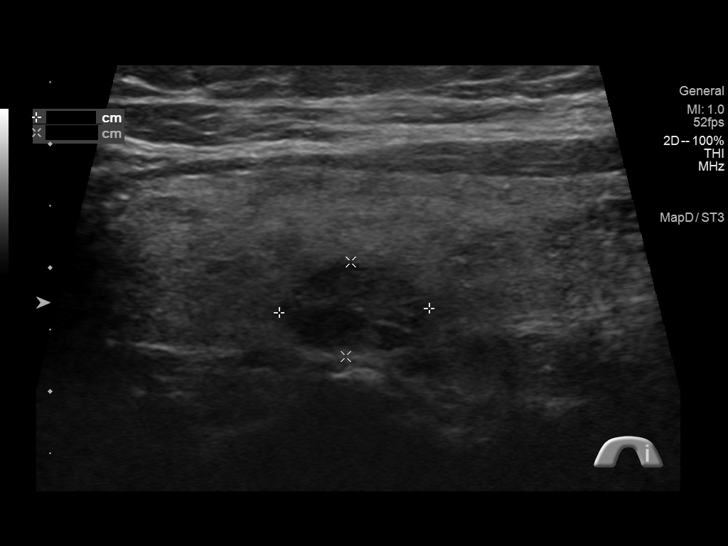
[im 66/80]
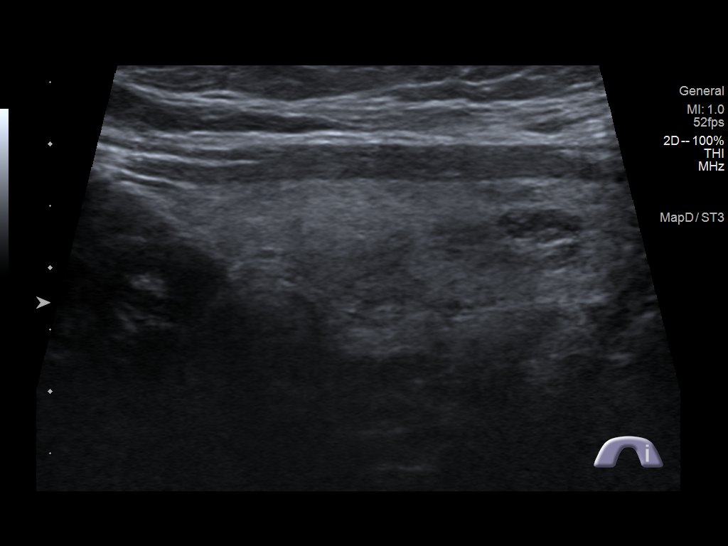
[im 73/80]
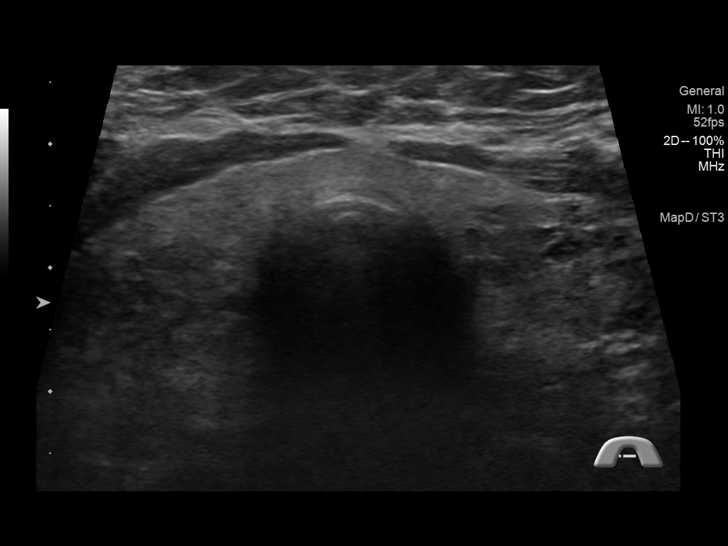
[im 80/80]
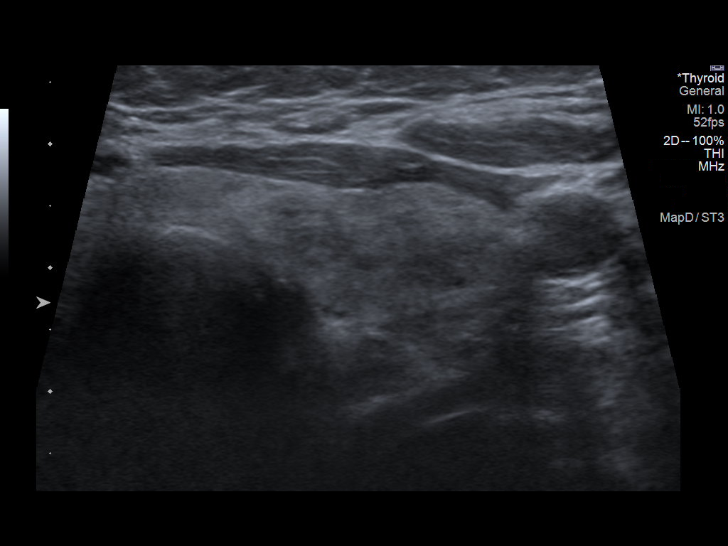

[13 of 25 positions shown; findings below may reference images not displayed]

FINDINGS: Parenchymal Echotexture: Mildly heterogenous

Isthmus: 3 mm

Right lobe: 5.0 x 2.1 x 2.2 cm

Left lobe: 5.0 x 2.1 x 1.9 cm

_________________________________________________________

Estimated total number of nodules >/= 1 cm: 2

Number of spongiform nodules >/=  2 cm not described below (TR1): 0

Number of mixed cystic and solid nodules >/= 1.5 cm not described
below (TR2): 0

_________________________________________________________

The previously biopsied right mid thyroid nodule measures 2.6 x
x 1.6 cm, previously 2.7 x 1.0 x 1.8 cm. This nodule is more cystic
than the prior exam and has already been biopsied. Correlate with
prior pathology.

Nodule # 2:

Location: Left; Mid

Maximum size: 1.3 cm; Other 2 dimensions: 0.8 x 1.2 cm

Composition: solid/almost completely solid (2)

Echogenicity: hypoechoic (2)

Shape: not taller-than-wide (0)

Margins: ill-defined (0)

Echogenic foci: none (0)

ACR TI-RADS total points: 4.

ACR TI-RADS risk category: TR4 (4-6 points).

ACR TI-RADS recommendations:

*Given size (>/= 1 - 1.4 cm) and appearance, a follow-up ultrasound
in 1 year should be considered based on TI-RADS criteria.

_________________________________________________________

Stable mild gland heterogeneity without hypervascularity. No
regional adenopathy.
IMPRESSION: Previously biopsied right mid thyroid 2.6 cm nodule is more cystic
than the prior exam. Correlate with prior pathology.

1.3 cm left mid thyroid TR 4 nodule meets criteria for follow-up in
1 year.

The above is in keeping with the ACR TI-RADS recommendations - [HOSPITAL] 8341;[DATE].

## 2020-03-01 DIAGNOSIS — L570 Actinic keratosis: Secondary | ICD-10-CM | POA: Diagnosis not present

## 2020-03-25 DIAGNOSIS — E041 Nontoxic single thyroid nodule: Secondary | ICD-10-CM | POA: Diagnosis not present

## 2020-03-25 DIAGNOSIS — I10 Essential (primary) hypertension: Secondary | ICD-10-CM | POA: Diagnosis not present

## 2020-03-25 DIAGNOSIS — E1165 Type 2 diabetes mellitus with hyperglycemia: Secondary | ICD-10-CM | POA: Diagnosis not present

## 2020-03-25 DIAGNOSIS — E7800 Pure hypercholesterolemia, unspecified: Secondary | ICD-10-CM | POA: Diagnosis not present

## 2020-03-29 DIAGNOSIS — E7801 Familial hypercholesterolemia: Secondary | ICD-10-CM | POA: Diagnosis not present

## 2020-03-29 DIAGNOSIS — I10 Essential (primary) hypertension: Secondary | ICD-10-CM | POA: Diagnosis not present

## 2020-03-29 DIAGNOSIS — E7800 Pure hypercholesterolemia, unspecified: Secondary | ICD-10-CM | POA: Diagnosis not present

## 2020-03-29 DIAGNOSIS — Z0000 Encounter for general adult medical examination without abnormal findings: Secondary | ICD-10-CM | POA: Diagnosis not present

## 2020-03-29 DIAGNOSIS — E1165 Type 2 diabetes mellitus with hyperglycemia: Secondary | ICD-10-CM | POA: Diagnosis not present

## 2020-05-10 DIAGNOSIS — J0101 Acute recurrent maxillary sinusitis: Secondary | ICD-10-CM | POA: Diagnosis not present

## 2020-07-18 DIAGNOSIS — I10 Essential (primary) hypertension: Secondary | ICD-10-CM | POA: Diagnosis not present

## 2020-07-18 DIAGNOSIS — E7800 Pure hypercholesterolemia, unspecified: Secondary | ICD-10-CM | POA: Diagnosis not present

## 2020-07-18 DIAGNOSIS — E1165 Type 2 diabetes mellitus with hyperglycemia: Secondary | ICD-10-CM | POA: Diagnosis not present

## 2020-08-17 DIAGNOSIS — I10 Essential (primary) hypertension: Secondary | ICD-10-CM | POA: Diagnosis not present

## 2020-08-17 DIAGNOSIS — E7800 Pure hypercholesterolemia, unspecified: Secondary | ICD-10-CM | POA: Diagnosis not present

## 2020-08-17 DIAGNOSIS — E1165 Type 2 diabetes mellitus with hyperglycemia: Secondary | ICD-10-CM | POA: Diagnosis not present

## 2020-09-02 DIAGNOSIS — D040 Carcinoma in situ of skin of lip: Secondary | ICD-10-CM | POA: Diagnosis not present

## 2020-09-02 DIAGNOSIS — D225 Melanocytic nevi of trunk: Secondary | ICD-10-CM | POA: Diagnosis not present

## 2020-09-02 DIAGNOSIS — L814 Other melanin hyperpigmentation: Secondary | ICD-10-CM | POA: Diagnosis not present

## 2020-09-02 DIAGNOSIS — L821 Other seborrheic keratosis: Secondary | ICD-10-CM | POA: Diagnosis not present

## 2020-09-02 DIAGNOSIS — Z85828 Personal history of other malignant neoplasm of skin: Secondary | ICD-10-CM | POA: Diagnosis not present

## 2020-09-14 DIAGNOSIS — D040 Carcinoma in situ of skin of lip: Secondary | ICD-10-CM | POA: Diagnosis not present

## 2020-09-17 DIAGNOSIS — E7800 Pure hypercholesterolemia, unspecified: Secondary | ICD-10-CM | POA: Diagnosis not present

## 2020-09-17 DIAGNOSIS — I10 Essential (primary) hypertension: Secondary | ICD-10-CM | POA: Diagnosis not present

## 2020-09-17 DIAGNOSIS — E1165 Type 2 diabetes mellitus with hyperglycemia: Secondary | ICD-10-CM | POA: Diagnosis not present

## 2020-11-07 DIAGNOSIS — Z1231 Encounter for screening mammogram for malignant neoplasm of breast: Secondary | ICD-10-CM | POA: Diagnosis not present

## 2021-01-10 DIAGNOSIS — N760 Acute vaginitis: Secondary | ICD-10-CM | POA: Diagnosis not present

## 2021-01-10 DIAGNOSIS — Z6833 Body mass index (BMI) 33.0-33.9, adult: Secondary | ICD-10-CM | POA: Diagnosis not present

## 2021-01-10 DIAGNOSIS — G720 Drug-induced myopathy: Secondary | ICD-10-CM | POA: Diagnosis not present

## 2021-01-18 DIAGNOSIS — I10 Essential (primary) hypertension: Secondary | ICD-10-CM | POA: Diagnosis not present

## 2021-01-18 DIAGNOSIS — E7800 Pure hypercholesterolemia, unspecified: Secondary | ICD-10-CM | POA: Diagnosis not present

## 2021-01-18 DIAGNOSIS — E1165 Type 2 diabetes mellitus with hyperglycemia: Secondary | ICD-10-CM | POA: Diagnosis not present

## 2021-03-20 DIAGNOSIS — E1165 Type 2 diabetes mellitus with hyperglycemia: Secondary | ICD-10-CM | POA: Diagnosis not present

## 2021-03-20 DIAGNOSIS — I10 Essential (primary) hypertension: Secondary | ICD-10-CM | POA: Diagnosis not present

## 2021-03-20 DIAGNOSIS — E7800 Pure hypercholesterolemia, unspecified: Secondary | ICD-10-CM | POA: Diagnosis not present

## 2021-03-30 DIAGNOSIS — I10 Essential (primary) hypertension: Secondary | ICD-10-CM | POA: Diagnosis not present

## 2021-03-30 DIAGNOSIS — R945 Abnormal results of liver function studies: Secondary | ICD-10-CM | POA: Diagnosis not present

## 2021-03-30 DIAGNOSIS — E041 Nontoxic single thyroid nodule: Secondary | ICD-10-CM | POA: Diagnosis not present

## 2021-03-30 DIAGNOSIS — E7801 Familial hypercholesterolemia: Secondary | ICD-10-CM | POA: Diagnosis not present

## 2021-03-30 DIAGNOSIS — E7800 Pure hypercholesterolemia, unspecified: Secondary | ICD-10-CM | POA: Diagnosis not present

## 2021-04-07 DIAGNOSIS — Z0000 Encounter for general adult medical examination without abnormal findings: Secondary | ICD-10-CM | POA: Diagnosis not present

## 2021-04-07 DIAGNOSIS — Z6832 Body mass index (BMI) 32.0-32.9, adult: Secondary | ICD-10-CM | POA: Diagnosis not present

## 2021-04-07 DIAGNOSIS — Z0001 Encounter for general adult medical examination with abnormal findings: Secondary | ICD-10-CM | POA: Diagnosis not present

## 2021-04-07 DIAGNOSIS — I10 Essential (primary) hypertension: Secondary | ICD-10-CM | POA: Diagnosis not present

## 2021-04-07 DIAGNOSIS — G629 Polyneuropathy, unspecified: Secondary | ICD-10-CM | POA: Diagnosis not present

## 2021-04-07 DIAGNOSIS — E1165 Type 2 diabetes mellitus with hyperglycemia: Secondary | ICD-10-CM | POA: Diagnosis not present

## 2021-05-19 DIAGNOSIS — E7800 Pure hypercholesterolemia, unspecified: Secondary | ICD-10-CM | POA: Diagnosis not present

## 2021-05-19 DIAGNOSIS — E1165 Type 2 diabetes mellitus with hyperglycemia: Secondary | ICD-10-CM | POA: Diagnosis not present

## 2021-05-19 DIAGNOSIS — I10 Essential (primary) hypertension: Secondary | ICD-10-CM | POA: Diagnosis not present

## 2021-06-18 DIAGNOSIS — I10 Essential (primary) hypertension: Secondary | ICD-10-CM | POA: Diagnosis not present

## 2021-06-18 DIAGNOSIS — E1165 Type 2 diabetes mellitus with hyperglycemia: Secondary | ICD-10-CM | POA: Diagnosis not present

## 2021-06-18 DIAGNOSIS — E7800 Pure hypercholesterolemia, unspecified: Secondary | ICD-10-CM | POA: Diagnosis not present

## 2021-06-19 DIAGNOSIS — L309 Dermatitis, unspecified: Secondary | ICD-10-CM | POA: Diagnosis not present

## 2021-06-19 DIAGNOSIS — N760 Acute vaginitis: Secondary | ICD-10-CM | POA: Diagnosis not present

## 2021-06-19 DIAGNOSIS — Z6832 Body mass index (BMI) 32.0-32.9, adult: Secondary | ICD-10-CM | POA: Diagnosis not present

## 2021-07-26 DIAGNOSIS — L409 Psoriasis, unspecified: Secondary | ICD-10-CM | POA: Diagnosis not present

## 2021-07-26 DIAGNOSIS — Z23 Encounter for immunization: Secondary | ICD-10-CM | POA: Diagnosis not present

## 2021-09-08 DIAGNOSIS — L821 Other seborrheic keratosis: Secondary | ICD-10-CM | POA: Diagnosis not present

## 2021-09-08 DIAGNOSIS — C44622 Squamous cell carcinoma of skin of right upper limb, including shoulder: Secondary | ICD-10-CM | POA: Diagnosis not present

## 2021-09-08 DIAGNOSIS — D225 Melanocytic nevi of trunk: Secondary | ICD-10-CM | POA: Diagnosis not present

## 2021-09-08 DIAGNOSIS — L409 Psoriasis, unspecified: Secondary | ICD-10-CM | POA: Diagnosis not present

## 2021-09-08 DIAGNOSIS — L814 Other melanin hyperpigmentation: Secondary | ICD-10-CM | POA: Diagnosis not present

## 2021-10-13 DIAGNOSIS — Z6832 Body mass index (BMI) 32.0-32.9, adult: Secondary | ICD-10-CM | POA: Diagnosis not present

## 2021-10-13 DIAGNOSIS — L4050 Arthropathic psoriasis, unspecified: Secondary | ICD-10-CM | POA: Diagnosis not present

## 2021-10-25 DIAGNOSIS — E041 Nontoxic single thyroid nodule: Secondary | ICD-10-CM | POA: Diagnosis not present

## 2021-10-25 DIAGNOSIS — E1165 Type 2 diabetes mellitus with hyperglycemia: Secondary | ICD-10-CM | POA: Diagnosis not present

## 2021-10-25 DIAGNOSIS — R945 Abnormal results of liver function studies: Secondary | ICD-10-CM | POA: Diagnosis not present

## 2021-10-25 DIAGNOSIS — I10 Essential (primary) hypertension: Secondary | ICD-10-CM | POA: Diagnosis not present

## 2021-10-31 DIAGNOSIS — E7849 Other hyperlipidemia: Secondary | ICD-10-CM | POA: Diagnosis not present

## 2021-10-31 DIAGNOSIS — L4050 Arthropathic psoriasis, unspecified: Secondary | ICD-10-CM | POA: Diagnosis not present

## 2021-10-31 DIAGNOSIS — E1165 Type 2 diabetes mellitus with hyperglycemia: Secondary | ICD-10-CM | POA: Diagnosis not present

## 2021-10-31 DIAGNOSIS — I10 Essential (primary) hypertension: Secondary | ICD-10-CM | POA: Diagnosis not present

## 2021-11-08 DIAGNOSIS — Z1231 Encounter for screening mammogram for malignant neoplasm of breast: Secondary | ICD-10-CM | POA: Diagnosis not present

## 2021-11-13 DIAGNOSIS — D0461 Carcinoma in situ of skin of right upper limb, including shoulder: Secondary | ICD-10-CM | POA: Diagnosis not present

## 2021-11-13 DIAGNOSIS — C44622 Squamous cell carcinoma of skin of right upper limb, including shoulder: Secondary | ICD-10-CM | POA: Diagnosis not present

## 2021-11-14 DIAGNOSIS — E1165 Type 2 diabetes mellitus with hyperglycemia: Secondary | ICD-10-CM | POA: Diagnosis not present

## 2021-12-15 DIAGNOSIS — L409 Psoriasis, unspecified: Secondary | ICD-10-CM | POA: Diagnosis not present

## 2021-12-15 DIAGNOSIS — L570 Actinic keratosis: Secondary | ICD-10-CM | POA: Diagnosis not present

## 2022-02-12 DIAGNOSIS — Z6833 Body mass index (BMI) 33.0-33.9, adult: Secondary | ICD-10-CM | POA: Diagnosis not present

## 2022-02-12 DIAGNOSIS — J069 Acute upper respiratory infection, unspecified: Secondary | ICD-10-CM | POA: Diagnosis not present

## 2022-02-12 DIAGNOSIS — R030 Elevated blood-pressure reading, without diagnosis of hypertension: Secondary | ICD-10-CM | POA: Diagnosis not present

## 2022-02-12 DIAGNOSIS — R059 Cough, unspecified: Secondary | ICD-10-CM | POA: Diagnosis not present

## 2022-02-22 DIAGNOSIS — H35361 Drusen (degenerative) of macula, right eye: Secondary | ICD-10-CM | POA: Diagnosis not present

## 2022-03-21 DIAGNOSIS — J040 Acute laryngitis: Secondary | ICD-10-CM | POA: Diagnosis not present

## 2022-03-21 DIAGNOSIS — J0101 Acute recurrent maxillary sinusitis: Secondary | ICD-10-CM | POA: Diagnosis not present

## 2022-03-21 DIAGNOSIS — Z6833 Body mass index (BMI) 33.0-33.9, adult: Secondary | ICD-10-CM | POA: Diagnosis not present

## 2022-03-21 DIAGNOSIS — J209 Acute bronchitis, unspecified: Secondary | ICD-10-CM | POA: Diagnosis not present

## 2022-03-27 DIAGNOSIS — Z808 Family history of malignant neoplasm of other organs or systems: Secondary | ICD-10-CM | POA: Diagnosis not present

## 2022-03-27 DIAGNOSIS — D0462 Carcinoma in situ of skin of left upper limb, including shoulder: Secondary | ICD-10-CM | POA: Diagnosis not present

## 2022-03-27 DIAGNOSIS — Z85828 Personal history of other malignant neoplasm of skin: Secondary | ICD-10-CM | POA: Diagnosis not present

## 2022-03-27 DIAGNOSIS — D225 Melanocytic nevi of trunk: Secondary | ICD-10-CM | POA: Diagnosis not present

## 2022-03-27 DIAGNOSIS — D485 Neoplasm of uncertain behavior of skin: Secondary | ICD-10-CM | POA: Diagnosis not present

## 2022-04-04 DIAGNOSIS — H2513 Age-related nuclear cataract, bilateral: Secondary | ICD-10-CM | POA: Diagnosis not present

## 2022-04-04 DIAGNOSIS — E119 Type 2 diabetes mellitus without complications: Secondary | ICD-10-CM | POA: Diagnosis not present

## 2022-04-04 DIAGNOSIS — Q141 Congenital malformation of retina: Secondary | ICD-10-CM | POA: Diagnosis not present

## 2022-04-26 DIAGNOSIS — E7800 Pure hypercholesterolemia, unspecified: Secondary | ICD-10-CM | POA: Diagnosis not present

## 2022-04-26 DIAGNOSIS — E7849 Other hyperlipidemia: Secondary | ICD-10-CM | POA: Diagnosis not present

## 2022-04-26 DIAGNOSIS — E1165 Type 2 diabetes mellitus with hyperglycemia: Secondary | ICD-10-CM | POA: Diagnosis not present

## 2022-04-26 DIAGNOSIS — E782 Mixed hyperlipidemia: Secondary | ICD-10-CM | POA: Diagnosis not present

## 2022-04-26 DIAGNOSIS — E7801 Familial hypercholesterolemia: Secondary | ICD-10-CM | POA: Diagnosis not present

## 2022-04-26 DIAGNOSIS — E041 Nontoxic single thyroid nodule: Secondary | ICD-10-CM | POA: Diagnosis not present

## 2022-04-27 DIAGNOSIS — E782 Mixed hyperlipidemia: Secondary | ICD-10-CM | POA: Diagnosis not present

## 2022-04-27 DIAGNOSIS — E1165 Type 2 diabetes mellitus with hyperglycemia: Secondary | ICD-10-CM | POA: Diagnosis not present

## 2022-04-27 DIAGNOSIS — E041 Nontoxic single thyroid nodule: Secondary | ICD-10-CM | POA: Diagnosis not present

## 2022-04-27 DIAGNOSIS — E7800 Pure hypercholesterolemia, unspecified: Secondary | ICD-10-CM | POA: Diagnosis not present

## 2022-04-30 DIAGNOSIS — I10 Essential (primary) hypertension: Secondary | ICD-10-CM | POA: Diagnosis not present

## 2022-04-30 DIAGNOSIS — E1165 Type 2 diabetes mellitus with hyperglycemia: Secondary | ICD-10-CM | POA: Diagnosis not present

## 2022-04-30 DIAGNOSIS — L4050 Arthropathic psoriasis, unspecified: Secondary | ICD-10-CM | POA: Diagnosis not present

## 2022-04-30 DIAGNOSIS — E7849 Other hyperlipidemia: Secondary | ICD-10-CM | POA: Diagnosis not present

## 2022-05-01 DIAGNOSIS — E7849 Other hyperlipidemia: Secondary | ICD-10-CM | POA: Diagnosis not present

## 2022-05-01 DIAGNOSIS — E1165 Type 2 diabetes mellitus with hyperglycemia: Secondary | ICD-10-CM | POA: Diagnosis not present

## 2022-05-01 DIAGNOSIS — Z0001 Encounter for general adult medical examination with abnormal findings: Secondary | ICD-10-CM | POA: Diagnosis not present

## 2022-05-01 DIAGNOSIS — I10 Essential (primary) hypertension: Secondary | ICD-10-CM | POA: Diagnosis not present

## 2022-05-02 DIAGNOSIS — E041 Nontoxic single thyroid nodule: Secondary | ICD-10-CM | POA: Diagnosis not present

## 2022-05-10 DIAGNOSIS — D0462 Carcinoma in situ of skin of left upper limb, including shoulder: Secondary | ICD-10-CM | POA: Diagnosis not present

## 2022-05-10 DIAGNOSIS — D485 Neoplasm of uncertain behavior of skin: Secondary | ICD-10-CM | POA: Diagnosis not present

## 2022-05-10 DIAGNOSIS — C44729 Squamous cell carcinoma of skin of left lower limb, including hip: Secondary | ICD-10-CM | POA: Diagnosis not present

## 2022-05-25 NOTE — Progress Notes (Signed)
Office Visit Note  Patient: Dorothy Robbins             Date of Birth: 06/24/1950           MRN: [id number]             PCP: Lanelle Bal, PA-C Referring: Lanelle Bal, PA-C Visit Date: 06/06/2022 Occupation: '@GUAROCC'$ @  Subjective:  Pain in multiple joints and psoriasis  History of Present Illness: Dorothy Robbins is a 72 y.o. female seen in consultation per request of her PCP.  According the patient she played several sports growing up and always had some joint pain.  She states in her 27s she started having lower back pain which was off and on.  The pain became more severe over the time and she was seen by a neurosurgeon.  She underwent lumbar spine fusion in 2020.  Back pain improved after the surgery.  She continues to have some discomfort in her cervical spine.  She also complains of discomfort in her bilateral wrist joints, bilateral hands, knees, and her feet.  She has noticed intermittent swelling in her hands and her feet.  There is no history of Achilles tendinitis or planter fasciitis.  She gives history of muscle cramps.  She states about 1 year ago she developed psoriasis in the umbilical and perineal region.  She also had few lesions on her forearms.  She had mild psoriasis in her scalp.  She was seen by Dr. Delman Cheadle about 6 months ago and was given topical agents.  She states the topical agents are not helpful and her psoriasis continues to spread.  There is no family history of psoriasis.  There is no family history of inflammatory bowel disease or ankylosing spondylitis.  There is history of systemic lupus in her paternal aunt and great niece.  She is gravida 3, para 2, stillborn 1.    Activities of Daily Living:  Patient reports morning stiffness for all day. Patient Reports nocturnal pain.  Difficulty dressing/grooming: Denies Difficulty climbing stairs: Denies Difficulty getting out of chair: Denies Difficulty using hands for taps, buttons, cutlery, and/or writing:  Denies  Review of Systems  Constitutional:  Positive for fatigue.  HENT:  Positive for mouth dryness. Negative for mouth sores.   Eyes:  Negative for dryness.  Respiratory:  Negative for difficulty breathing.   Cardiovascular:  Negative for chest pain and palpitations.  Gastrointestinal:  Positive for constipation. Negative for blood in stool and diarrhea.  Endocrine: Negative for increased urination.  Genitourinary:  Negative for involuntary urination.  Musculoskeletal:  Positive for joint pain, joint pain, joint swelling, myalgias, morning stiffness, muscle tenderness and myalgias. Negative for gait problem and muscle weakness.  Skin:  Positive for rash and sensitivity to sunlight. Negative for color change and hair loss.  Allergic/Immunologic: Negative for susceptible to infections.  Neurological:  Negative for dizziness and headaches.  Hematological:  Negative for swollen glands.  Psychiatric/Behavioral:  Positive for sleep disturbance. Negative for depressed mood. The patient is not nervous/anxious.     PMFS History:  Patient Active Problem List   Diagnosis Date Noted   Lumbar stenosis with neurogenic claudication 11/07/2018   Elevated liver enzymes 11/06/2016   Essential hypertension 05/08/2016   Diabetes ([place]) 05/08/2016    Past Medical History:  Diagnosis Date   Diabetes ([place]) 05/08/2016   Elevated liver enzymes 11/06/2016   Essential hypertension 05/08/2016    Family History  Problem Relation Age of Onset   Diabetes Mother  Heart disease Mother    Thyroid disease Mother    Aneurysm Father    Hypertension Father    COPD Sister    Lung cancer Sister    Pancreatic cancer Sister    Past Surgical History:  Procedure Laterality Date   BACK SURGERY     SURGERY OF LIP     Social History   Social History Narrative   Not on file    There is no immunization history on file for this patient.   Objective: Vital Signs: BP 130/80 (BP Location: Right Arm, Patient  Position: Sitting, Cuff Size: Normal)   Pulse 82   Resp 16   Ht '5\' 3"'$  (1.6 m)   Wt 189 lb 12.8 oz (86.1 kg)   BMI 33.62 kg/m    Physical Exam Vitals and nursing note reviewed.  Constitutional:      Appearance: She is well-developed.  HENT:     Head: Normocephalic and atraumatic.  Eyes:     Conjunctiva/sclera: Conjunctivae normal.  Cardiovascular:     Rate and Rhythm: Normal rate and regular rhythm.     Heart sounds: Normal heart sounds.  Pulmonary:     Effort: Pulmonary effort is normal.     Breath sounds: Normal breath sounds.  Abdominal:     General: Bowel sounds are normal.     Palpations: Abdomen is soft.  Musculoskeletal:     Cervical back: Normal range of motion.  Lymphadenopathy:     Cervical: No cervical adenopathy.  Skin:    General: Skin is warm and dry.     Capillary Refill: Capillary refill takes less than 2 seconds.  Neurological:     Mental Status: She is alert and oriented to person, place, and time.  Psychiatric:        Behavior: Behavior normal.      Musculoskeletal Exam: Cervical spine was in good range of motion.  She had no tenderness over thoracic or lumbar spine.  She has surgical scar from the previous surgery on the lumbar spine.  She had mild tenderness over SI joints.  She had tenderness over bilateral trochanteric bursa.  Shoulder joints and elbow joints were in good range of motion.  She had good range of motion of bilateral wrist joints.  Synovitis was noted over right first and second MCP joints.  She had tenderness over right CMC joint.  PIP and DIP thickening with no synovitis was noted.  Hip joints were in good range of motion with discomfort.  CDAI Exam: CDAI Score: -- Patient Global: --; Provider Global: -- Swollen: --; Tender: -- Joint Exam 06/06/2022   No joint exam has been documented for this visit   There is currently no information documented on the homunculus. Go to the Rheumatology activity and complete the homunculus joint  exam.  Investigation: No additional findings.  Imaging: No results found.  Recent Labs: Lab Results  Component Value Date   WBC 13.6 (H) 11/07/2018   HGB 12.0 11/07/2018   PLT 298 11/07/2018   NA 131 (L) 11/04/2018   K 3.4 (L) 11/04/2018   CL 96 (L) 11/04/2018   CO2 24 11/04/2018   GLUCOSE 106 (H) 11/04/2018   BUN 10 11/04/2018   CREATININE 0.80 11/07/2018   BILITOT 0.5 11/04/2018   ALKPHOS 52 11/04/2018   AST 24 11/04/2018   ALT 32 11/04/2018   PROT 6.9 11/04/2018   ALBUMIN 4.4 11/04/2018   CALCIUM 9.0 11/04/2018   GFRAA >60 11/07/2018    Speciality Comments:  No specialty comments available.  Procedures:  No procedures performed Allergies: Sulfa antibiotics   Assessment / Plan:     Visit Diagnoses: Psoriatic arthritis ([place]) -patient gives history of pain and stiffness in multiple joints.  She has significant morning stiffness.  She had synovitis over her right second and third MCP joints.  She gives history of intermittent swelling in her bilateral hands and her bilateral feet.  No dactylitis was noted.  There was no history of Planter fasciitis or Achilles tendinitis.  She had bilateral trochanteric bursitis.  Detailed counsel regarding psoriatic arthritis was provided.  Different treatment options and their side effects were discussed.  She has history of fatty liver and elevated LFTs.  Methotrexate will not be a good choice.  We discussed different treatment options and their side effects.  I would like to proceed with Skyrizi.  Handout was given and consent was taken.  Will obtain labs today.  Plan: Sedimentation rate  Counseled patient that Orson Ape is a IL-23 inhibitor.  Counseled patient on purpose, proper use, and adverse effects of Skyrizi.  Reviewed the most common adverse effects including infection, URTIs, headache, fatigue, tinea infections, and injection site. Counseled patient that Orson Ape should be held for infection and prior to scheduled surgery.  Recommend  annual influenza, PCV 15 or PCV20 or Pneumovax 23, and Shingrix as indicated. Advised that live vaccines should be avoided while taking Skyrizi.  Reviewed the importance of regular labs while on Skyrizi therapy.  Will monitor CBC and CMP 1 month after starting and then every 3 months routinely thereafter. Will monitor TB gold annually. Standing orders placed.  Provided patient with medication education material and answered all questions.  Patient voiced understanding.  Patient consented to [place].  Will upload consent into the media tab.  Reviewed storage instructions of Skyrizi.  Advised initial injection must be administered in office.  Patient verbalized understanding.  Will apply for Skyrizi through [employer] and update when we receive a response.  Dose will be Skyrizi 150 mg subcuteanously at week 0, week 4 then every 12 weeks thereafter.  Prescription pending lab results and insurance approval.   High risk medication use -will obtain labs to start her on immunosuppressive therapy.  Plan: CBC with Differential/Platelet, COMPLETE METABOLIC PANEL WITH GFR, Hepatitis B core antibody, IgM, Hepatitis B surface antigen, Hepatitis C antibody, QuantiFERON-TB Gold Plus, Serum protein electrophoresis with reflex, IgG, IgA, IgM  Pain in both hands -she complains of pain and discomfort in her bilateral hands and intermittent swelling.  She has synovitis of her right first and second MCP joints today.  Plan: XR Hand 2 View Right, XR Hand 2 View Left, right hand x-rays were consistent with osteoarthritis and inflammatory arthritis with right first and second MCP narrowing.  No erosive changes were noted.  Left hand x-rays were consistent with osteoarthritis.  Rheumatoid factor, Cyclic citrul peptide antibody, IgG  Chronic pain of both hips -she complains of discomfort in her bilateral hip joints.  She had painful range of motion of bilateral hip joints.  She also had tenderness over bilateral  trochanteric bursa.  Plan: XR HIPS BILAT W OR W/O PELVIS 3-4 VIEWS.  No hip joint narrowing was noted.  SI joint to sclerosis consistent with osteoarthritis was noted.  Chronic pain of both knees -she has discomfort in her bilateral knee joints.  No warmth swelling or effusion was noted.  Plan: XR KNEE 3 VIEW RIGHT, XR KNEE 3 VIEW LEFT.  X-rays showed bilateral mild osteoarthritis and  moderate chondromalacia patella.  Pain in both feet -she gives history of intermittent pain and discomfort in the bilateral feet.  No synovitis was noted.  There was no planter fasciitis or Achilles tendinitis.  Plan: XR Foot 2 Views Right, XR Foot 2 Views Left.  X-rays were consistent with osteoarthritis.  Chronic SI joint pain-she gives history of discomfort in her bilateral SI joints intermittently.  She had mild tenderness over SI joints.  Lumbar stenosis with neurogenic claudication - s/p fusion 2020 by Dr. Sebastian Ache developed psoriasis about 1 year ago.  She had extensive psoriasis over her entire trunk and extremities.  Elevated LFTs-patient gives history of elevated LFTs and fatty liver.  I will obtain labs today.  Myalgia -she has some muscle pain.  No muscle weakness was noted.  Plan: CK  Cancer, skin, squamous cell-right lower extremity -she has a squamous cell cancer on her right shin.  Patient is scheduled to have Mohs surgery.  Essential hypertension-blood pressure was 130/80 today.  History of diabetes mellitus  Family history of systemic lupus erythematosus-paternal aunt and grand niece  Orders: Orders Placed This Encounter  Procedures   XR Hand 2 View Right   XR Hand 2 View Left   XR KNEE 3 VIEW RIGHT   XR KNEE 3 VIEW LEFT   XR HIPS BILAT W OR W/O PELVIS 3-4 VIEWS   XR Foot 2 Views Right   XR Foot 2 Views Left   CBC with Differential/Platelet   COMPLETE METABOLIC PANEL WITH GFR   Sedimentation rate   CK   Rheumatoid factor   Cyclic citrul peptide antibody, IgG    Hepatitis B core antibody, IgM   Hepatitis B surface antigen   Hepatitis C antibody   QuantiFERON-TB Gold Plus   Serum protein electrophoresis with reflex   IgG, IgA, IgM   No orders of the defined types were placed in this encounter.    Follow-Up Instructions: Return for Psoriatic arthritis.   Bo Merino, MD  Note - This record has been created using Editor, commissioning.  Chart creation errors have been sought, but may not always  have been located. Such creation errors do not reflect on  the standard of medical care.

## 2022-05-28 DIAGNOSIS — K080 Exfoliation of teeth due to systemic causes: Secondary | ICD-10-CM | POA: Diagnosis not present

## 2022-06-06 ENCOUNTER — Telehealth: Payer: Self-pay | Admitting: Pharmacist

## 2022-06-06 ENCOUNTER — Ambulatory Visit (INDEPENDENT_AMBULATORY_CARE_PROVIDER_SITE_OTHER): Payer: Medicare Other

## 2022-06-06 ENCOUNTER — Ambulatory Visit: Payer: Medicare Other

## 2022-06-06 ENCOUNTER — Ambulatory Visit: Payer: Medicare Other | Attending: Rheumatology | Admitting: Rheumatology

## 2022-06-06 ENCOUNTER — Encounter: Payer: Self-pay | Admitting: Rheumatology

## 2022-06-06 VITALS — BP 130/80 | HR 82 | Resp 16 | Ht 63.0 in | Wt 189.8 lb

## 2022-06-06 DIAGNOSIS — M25561 Pain in right knee: Secondary | ICD-10-CM

## 2022-06-06 DIAGNOSIS — R7989 Other specified abnormal findings of blood chemistry: Secondary | ICD-10-CM

## 2022-06-06 DIAGNOSIS — M25562 Pain in left knee: Secondary | ICD-10-CM

## 2022-06-06 DIAGNOSIS — M79641 Pain in right hand: Secondary | ICD-10-CM | POA: Diagnosis not present

## 2022-06-06 DIAGNOSIS — Z79899 Other long term (current) drug therapy: Secondary | ICD-10-CM | POA: Diagnosis not present

## 2022-06-06 DIAGNOSIS — M79642 Pain in left hand: Secondary | ICD-10-CM | POA: Diagnosis not present

## 2022-06-06 DIAGNOSIS — M79672 Pain in left foot: Secondary | ICD-10-CM

## 2022-06-06 DIAGNOSIS — Z1159 Encounter for screening for other viral diseases: Secondary | ICD-10-CM | POA: Diagnosis not present

## 2022-06-06 DIAGNOSIS — Z8269 Family history of other diseases of the musculoskeletal system and connective tissue: Secondary | ICD-10-CM

## 2022-06-06 DIAGNOSIS — G8929 Other chronic pain: Secondary | ICD-10-CM

## 2022-06-06 DIAGNOSIS — M2550 Pain in unspecified joint: Secondary | ICD-10-CM

## 2022-06-06 DIAGNOSIS — M25552 Pain in left hip: Secondary | ICD-10-CM

## 2022-06-06 DIAGNOSIS — M25551 Pain in right hip: Secondary | ICD-10-CM | POA: Diagnosis not present

## 2022-06-06 DIAGNOSIS — M48062 Spinal stenosis, lumbar region with neurogenic claudication: Secondary | ICD-10-CM

## 2022-06-06 DIAGNOSIS — C4492 Squamous cell carcinoma of skin, unspecified: Secondary | ICD-10-CM

## 2022-06-06 DIAGNOSIS — M79671 Pain in right foot: Secondary | ICD-10-CM

## 2022-06-06 DIAGNOSIS — M791 Myalgia, unspecified site: Secondary | ICD-10-CM | POA: Diagnosis not present

## 2022-06-06 DIAGNOSIS — L409 Psoriasis, unspecified: Secondary | ICD-10-CM

## 2022-06-06 DIAGNOSIS — M533 Sacrococcygeal disorders, not elsewhere classified: Secondary | ICD-10-CM

## 2022-06-06 DIAGNOSIS — L4050 Arthropathic psoriasis, unspecified: Secondary | ICD-10-CM

## 2022-06-06 DIAGNOSIS — I10 Essential (primary) hypertension: Secondary | ICD-10-CM

## 2022-06-06 DIAGNOSIS — Z8639 Personal history of other endocrine, nutritional and metabolic disease: Secondary | ICD-10-CM

## 2022-06-06 NOTE — Telephone Encounter (Signed)
Please start SKYRIZI SQ BIV.  Dose: '150mg'$  at Week 0, Week 4, then every 12 weeks (please run auth for 84 days supply)  Dx: Psoriatic arthritis (L40.5) and Psoriasis (L40.9)  Therapies patient unable to try: MTX/leflunomide - elevated LFTs TNF inhibitors - history of basal cell cancer and family history of lupis  PAP paperwork completed by pt today. Placed in Dr. Arlean Hopping folder for signature  Knox Saliva, PharmD, MPH, BCPS, CPP Clinical Pharmacist (Rheumatology and Pulmonology)

## 2022-06-06 NOTE — Progress Notes (Signed)
Pharmacy Note  Subjective: Patient presents today to [facility] for follow up office visit.  Patient seen by the pharmacist for counseling on [place] for psoriatic arthritis and plaque psoriasis.  History of squamous cell skin cancer and family history of SLE   Objective:  CBC    Component Value Date/Time   WBC 13.6 (H) 11/07/2018 1405   RBC 3.80 (L) 11/07/2018 1405   HGB 12.0 11/07/2018 1405   HCT 35.2 (L) 11/07/2018 1405   PLT 298 11/07/2018 1405   MCV 92.6 11/07/2018 1405   MCH 31.6 11/07/2018 1405   MCHC 34.1 11/07/2018 1405   RDW 12.9 11/07/2018 1405     CMP     Component Value Date/Time   NA 131 (L) 11/04/2018 1044   K 3.4 (L) 11/04/2018 1044   CL 96 (L) 11/04/2018 1044   CO2 24 11/04/2018 1044   GLUCOSE 106 (H) 11/04/2018 1044   BUN 10 11/04/2018 1044   CREATININE 0.80 11/07/2018 1405   CALCIUM 9.0 11/04/2018 1044   PROT 6.9 11/04/2018 1044   ALBUMIN 4.4 11/04/2018 1044   AST 24 11/04/2018 1044   ALT 32 11/04/2018 1044   ALKPHOS 52 11/04/2018 1044   BILITOT 0.5 11/04/2018 1044   GFRNONAA >60 11/07/2018 1405   GFRAA >60 11/07/2018 1405     Baseline Immunosuppressant Therapy Labs TB GOLD   Hepatitis Panel    Latest Ref Rng & Units 05/08/2016    3:25 PM  Hepatitis  Hep B Surface Ag NEGATIVE NEGATIVE   Hep B IgM NON REACTIVE NON REACTIVE   Hep C Ab NEGATIVE NEGATIVE   Hep A IgM NON REACTIVE NON REACTIVE    HIV No results found for: "HIV" Immunoglobulins    Latest Ref Rng & Units 11/06/2016    9:34 AM  Immunoglobulin Electrophoresis  IgG 694 - 1,618 mg/dL 1,075    SPEP    Latest Ref Rng & Units 11/04/2018   10:44 AM  Serum Protein Electrophoresis  Total Protein 6.5 - 8.1 g/dL 6.9    G6PD No results found for: "G6PDH" TPMT No results found for: "TPMT"   Assessment/Plan:  Counseled patient that Orson Ape is a IL-23 inhibitor.  Counseled patient on purpose, proper use, and adverse effects of Skyrizi.  Reviewed the most common  adverse effects including infection, URTIs, headache, fatigue, tinea infections, and injection site. Counseled patient that Orson Ape should be held for infection and prior to scheduled surgery.  Recommend annual influenza, PCV 15 or PCV20 or Pneumovax 23, and Shingrix as indicated. Advised that live vaccines should be avoided while taking Skyrizi.  Reviewed the importance of regular labs while on Skyrizi therapy.  Will monitor CBC and CMP 1 month after starting and then every 3 months routinely thereafter. Will monitor TB gold annually. Standing orders placed.  Provided patient with medication education material and answered all questions.  Patient voiced understanding.  Patient consented to [place].  Will upload consent into the media tab.  Reviewed storage instructions of Skyrizi.  Advised initial injection must be administered in office.  Patient verbalized understanding.  Will apply for Skyrizi through [employer] and update when we receive a response.  Dose will be Skyrizi 150 mg subcuteanously at week 0, week 4 then every 12 weeks thereafter.  Prescription pending lab results and insurance approval. Patient assistance paperwork completed at [place] today.  Not a good candidate for MTX/leflunomide due to history of elevated LFTs. Not a good candidate for TNF inhibitors due History of squamous  cell skin cancer and family history of SLE  Knox Saliva, PharmD, MPH, BCPS, CPP Clinical Pharmacist (Rheumatology and Pulmonology)

## 2022-06-06 NOTE — Patient Instructions (Addendum)
Risankizumab Injection What is this medication? RISANKIZUMAB (RIS an KIZ ue mab) treats autoimmune conditions, such as psoriasis, arthritis, and Crohn's disease. It works by slowing down an overactive immune system. It is a monoclonal antibody. This medicine may be used for other purposes; ask your health care provider or pharmacist if you have questions. COMMON BRAND NAME(S): Skyrizi What should I tell my care team before I take this medication? They need to know if you have any of these conditions: Hepatic disease Immune system problems Infection, such as tuberculosis (TB), bacterial, fungal or viral infections Recent or upcoming vaccine An unusual or allergic reaction to risankizumab, other medications, foods, dyes, or preservatives Pregnant or trying to get pregnant Breast-feeding How should I use this medication? This medication is injected into a vein or under the skin. It is given by your care team in a hospital or clinic setting. It may also be given at home. If you get this medication at home, you will be taught how to prepare and give it. Use exactly as directed. Take it as directed on the prescription label. Keep taking it unless your care team tells you to stop. If you use a pen, be sure to take off the outer needle cover before using the dose. It is important that you put your used needles and syringes in a special sharps container. Do not put them in a trash can. If you do not have a sharps container, call your pharmacist or care team to get one. A special MedGuide will be given to you by the pharmacist with each prescription and refill. Be sure to read this information carefully each time. This medication comes with INSTRUCTIONS FOR USE. Ask your pharmacist for directions on how to use this medication. Read the information carefully. Talk to your pharmacist or care team if you have questions. Talk to your care team about the use of this medication in children. Special care may be  needed. Overdosage: If you think you have taken too much of this medicine contact a poison control center or emergency room at once. NOTE: This medicine is only for you. Do not share this medicine with others. What if I miss a dose? It is important not to miss any doses. Talk to your care team about what to do if you miss a dose. What may interact with this medication? Do not take this medication with any of the following: Live vaccines This list may not describe all possible interactions. Give your health care provider a list of all the medicines, herbs, non-prescription drugs, or dietary supplements you use. Also tell them if you smoke, drink alcohol, or use illegal drugs. Some items may interact with your medicine. What should I watch for while using this medication? Visit your care team for regular checks on your progress. Tell your care team if your symptoms do not start to get better or if they get worse. You will be tested for tuberculosis (TB) before you start this medication. If your care team prescribes any medication for TB, you should start taking the TB medication before starting this medication. Make sure to finish the full course of TB medication. This medication may increase your risk of getting an infection. Call your care team for advice if you get a fever, chills, sore throat, or other symptoms of a cold or flu. Do not treat yourself. Try to avoid being around people who are sick. This medication can decrease the response to a vaccine. If you need to get  vaccinated, tell your care team if you have received this medication. Extra booster doses may be needed. Talk to your care team to see if a different vaccination schedule is needed. What side effects may I notice from receiving this medication? Side effects that you should report to your care team as soon as possible: Allergic reactions--skin rash, itching, hives, swelling of the face, lips, tongue, or throat Infection--fever,  chills, cough, sore throat, wounds that don't heal, pain or trouble when passing urine, general feeling of discomfort or being unwell Liver injury--right upper belly pain, loss of appetite, nausea, light-colored stool, dark yellow or brown urine, yellowing skin or eyes, unusual weakness or fatigue Side effects that usually do not require medical attention (report to your care team if they continue or are bothersome): Fatigue Headache Pain, redness, or irritation at injection site Runny or stuffy nose Sore throat This list may not describe all possible side effects. Call your doctor for medical advice about side effects. You may report side effects to FDA at 1-800-FDA-1088. Where should I keep my medication? Keep out of the reach of children and pets. Store in a refrigerator. Do not freeze. Protect from light. Keep it in the original carton until you are ready to take it. See product for storage information. Each product may have different instructions. Remove the dose from the carton about 30 to 45 minutes before it is time for you to take it. Get rid of any unused medication after the expiration date. To get rid of medications that are no longer needed or have expired: Take the medication to a medication take-back program. Check with your pharmacy or law enforcement to find a location. If you cannot return the medication, ask your pharmacist or care team how to get rid of this medication safely. NOTE: This sheet is a summary. It may not cover all possible information. If you have questions about this medicine, talk to your doctor, pharmacist, or health care provider.  2023 Elsevier/Gold Standard (2021-07-19 00:00:00)  Vaccines You are taking a medication(s) that can suppress your immune system.  The following immunizations are recommended: Flu annually Covid-19  Td/Tdap (tetanus, diphtheria, pertussis) every 10 years Pneumonia (Prevnar 15 then Pneumovax 23 at least 1 year apart.   Alternatively, can take Prevnar 20 without needing additional dose) Shingrix: 2 doses from 4 weeks to 6 months apart  Please check with your PCP to make sure you are up to date.   If you have signs or symptoms of an infection or start antibiotics: First, call your PCP for workup of your infection. Hold your medication through the infection, until you complete your antibiotics, and until symptoms resolve if you take the following: Injectable medication (Actemra, Benlysta, Cimzia, Cosentyx, Enbrel, Humira, Kevzara, Orencia, Remicade, Simponi, Stelara, Taltz, Tremfya) Methotrexate Leflunomide (Arava) Mycophenolate (Cellcept) Morrie Sheldon, Olumiant, or Rinvoq

## 2022-06-06 NOTE — Telephone Encounter (Signed)
Submitted a Prior Authorization request to [employer] for SKYRIZI via CoverMyMeds. Will update once we receive a response.  Key: [id number]

## 2022-06-07 ENCOUNTER — Other Ambulatory Visit (HOSPITAL_COMMUNITY): Payer: Self-pay

## 2022-06-07 ENCOUNTER — Telehealth: Payer: Self-pay | Admitting: Rheumatology

## 2022-06-07 DIAGNOSIS — C44722 Squamous cell carcinoma of skin of right lower limb, including hip: Secondary | ICD-10-CM | POA: Diagnosis not present

## 2022-06-07 HISTORY — PX: SKIN CANCER EXCISION: SHX779

## 2022-06-07 NOTE — Telephone Encounter (Signed)
Patient called stating she was returning a call to the office.  Patient states she was not able to listen to her voicemail because she is at another doctor's appointment.

## 2022-06-07 NOTE — Telephone Encounter (Signed)
Received notification from [facility] regarding a prior authorization for [facility]. Authorization has been APPROVED from 06/06/2022 to 06/06/2022. Approval letter sent to scan center.  Per test claim, copay for 28 days supply is $3017.35  Authorization # [facility]   Phone # [phone]  Submitted Patient Assistance Application to [place] for [facility] along with provider portion, patient portion, med ist, insurance card copy, and PA. Will update patient when we receive a response.  Fax# [phone] Phone# [phone]  Knox Saliva, PharmD, MPH, BCPS, CPP Clinical Pharmacist (Rheumatology and Pulmonology)

## 2022-06-08 NOTE — Telephone Encounter (Signed)
Reached out to patient and advised we have not tried to reach out to her. Patient expressed understanding.

## 2022-06-08 NOTE — Telephone Encounter (Signed)
We did not call patient. Would have placed note that we leftVM if we had.  Knox Saliva, PharmD, MPH, BCPS, CPP Clinical Pharmacist (Rheumatology and Pulmonology)

## 2022-06-09 LAB — COMPLETE METABOLIC PANEL WITH GFR
AG Ratio: 1.4 (calc) (ref 1.0–2.5)
ALT: 157 U/L — ABNORMAL HIGH (ref 6–29)
AST: 123 U/L — ABNORMAL HIGH (ref 10–35)
Albumin: 4.8 g/dL (ref 3.6–5.1)
Alkaline phosphatase (APISO): 73 U/L (ref 37–153)
BUN: 13 mg/dL (ref 7–25)
CO2: 24 mmol/L (ref 20–32)
Calcium: 10.2 mg/dL (ref 8.6–10.4)
Chloride: 97 mmol/L — ABNORMAL LOW (ref 98–110)
Creat: 0.79 mg/dL (ref 0.60–1.00)
Globulin: 3.4 g/dL (calc) (ref 1.9–3.7)
Glucose, Bld: 105 mg/dL — ABNORMAL HIGH (ref 65–99)
Potassium: 3.9 mmol/L (ref 3.5–5.3)
Sodium: 143 mmol/L (ref 135–146)
Total Bilirubin: 0.3 mg/dL (ref 0.2–1.2)
Total Protein: 8.2 g/dL — ABNORMAL HIGH (ref 6.1–8.1)
eGFR: 80 mL/min/{1.73_m2} (ref >=60)

## 2022-06-09 LAB — CBC WITH DIFFERENTIAL/PLATELET
Absolute Monocytes: 580 cells/uL (ref 200–950)
Basophils Absolute: 67 cells/uL (ref 0–200)
Basophils Relative: 0.8 %
Eosinophils Absolute: 302 cells/uL (ref 15–500)
Eosinophils Relative: 3.6 %
HCT: 46 % — ABNORMAL HIGH (ref 35.0–45.0)
Hemoglobin: 15.8 g/dL — ABNORMAL HIGH (ref 11.7–15.5)
Lymphs Abs: 2134 cells/uL (ref 850–3900)
MCH: 32 pg (ref 27.0–33.0)
MCHC: 34.3 g/dL (ref 32.0–36.0)
MCV: 93.1 fL (ref 80.0–100.0)
MPV: 11.8 fL (ref 7.5–12.5)
Monocytes Relative: 6.9 %
Neutro Abs: 5317 cells/uL (ref 1500–7800)
Neutrophils Relative %: 63.3 %
Platelets: 361 10*3/uL (ref 140–400)
RBC: 4.94 10*6/uL (ref 3.80–5.10)
RDW: 12.6 % (ref 11.0–15.0)
Total Lymphocyte: 25.4 %
WBC: 8.4 10*3/uL (ref 3.8–10.8)

## 2022-06-09 LAB — PROTEIN ELECTROPHORESIS, SERUM, WITH REFLEX
Albumin ELP: 4.7 g/dL (ref 3.8–4.8)
Alpha 1: 0.3 g/dL (ref 0.2–0.3)
Alpha 2: 0.9 g/dL (ref 0.5–0.9)
Beta 2: 0.5 g/dL (ref 0.2–0.5)
Beta Globulin: 0.5 g/dL (ref 0.4–0.6)
Gamma Globulin: 1.1 g/dL (ref 0.8–1.7)
Total Protein: 7.9 g/dL (ref 6.1–8.1)

## 2022-06-09 LAB — QUANTIFERON-TB GOLD PLUS
Mitogen-NIL: >10 IU/mL
NIL: 0.03 IU/mL
QuantiFERON-TB Gold Plus: NEGATIVE
TB1-NIL: 0 IU/mL
TB2-NIL: 0 IU/mL

## 2022-06-09 LAB — CYCLIC CITRUL PEPTIDE ANTIBODY, IGG: Cyclic Citrullin Peptide Ab: <16 UNITS

## 2022-06-09 LAB — IGG, IGA, IGM
IgG (Immunoglobin G), Serum: 1189 mg/dL (ref 600–1540)
IgM, Serum: 113 mg/dL (ref 50–300)
Immunoglobulin A: 242 mg/dL (ref 70–320)

## 2022-06-09 LAB — RHEUMATOID FACTOR: Rheumatoid fact SerPl-aCnc: <14 IU/mL (ref <14)

## 2022-06-09 LAB — HEPATITIS B CORE ANTIBODY, IGM: Hep B C IgM: NONREACTIVE

## 2022-06-09 LAB — HEPATITIS B SURFACE ANTIGEN: Hepatitis B Surface Ag: NONREACTIVE

## 2022-06-09 LAB — SEDIMENTATION RATE: Sed Rate: 14 mm/h (ref 0–30)

## 2022-06-09 LAB — HEPATITIS C ANTIBODY: Hepatitis C Ab: NONREACTIVE

## 2022-06-09 LAB — CK: Total CK: 127 U/L (ref 29–143)

## 2022-06-11 NOTE — Progress Notes (Signed)
I will discuss results at the follow-up visit.

## 2022-06-14 DIAGNOSIS — L570 Actinic keratosis: Secondary | ICD-10-CM | POA: Diagnosis not present

## 2022-06-14 DIAGNOSIS — L309 Dermatitis, unspecified: Secondary | ICD-10-CM | POA: Diagnosis not present

## 2022-06-14 DIAGNOSIS — L089 Local infection of the skin and subcutaneous tissue, unspecified: Secondary | ICD-10-CM | POA: Diagnosis not present

## 2022-06-14 DIAGNOSIS — L308 Other specified dermatitis: Secondary | ICD-10-CM | POA: Diagnosis not present

## 2022-06-14 DIAGNOSIS — L409 Psoriasis, unspecified: Secondary | ICD-10-CM | POA: Diagnosis not present

## 2022-06-16 NOTE — Progress Notes (Signed)
Office Visit Note  Patient: Dorothy Robbins             Date of Birth: [birth year]           MRN: [id number]             PCP: Lanelle Bal, PA-C Referring: Rory Percy, MD Visit Date: 06/28/2022 Occupation: '@GUAROCC'$ @  Subjective:  Pain in multiple joints and psoriasis  History of Present Illness: Dorothy Robbins is a 72 y.o. female with history of psoriatic arthritis and psoriasis.  She states she developed an infection on her right shin for which she was seen by Dr. Delman Cheadle.  She was treated with oral antibiotics and NSAIDs and Tylenol.  She states the ulcer is gradually healing.  She is using topical antibiotics and doing dressing change.  She states that the culture was positive for staph infection but not MRSA.  Patient states she has clearance from Dr. Delman Cheadle to start [facility].  Patient states that after taking antibiotics a lot of her psoriasis rash improved.  She still have rash all over her trunk and extremities.  She continues to have pain and discomfort in her hands, knee joints, feet and her SI joints.  She has not noticed much joint swelling.    Activities of Daily Living:  Patient reports morning stiffness for 10 minutes.   Patient Denies nocturnal pain.  Difficulty dressing/grooming: Denies Difficulty climbing stairs: Denies Difficulty getting out of chair: Denies Difficulty using hands for taps, buttons, cutlery, and/or writing: Reports  Review of Systems  Constitutional:  Positive for fatigue.  HENT:  Positive for mouth dryness. Negative for mouth sores.   Eyes: Negative.  Negative for dryness.  Respiratory: Negative.  Negative for shortness of breath.   Cardiovascular: Negative.  Negative for chest pain and palpitations.  Gastrointestinal:  Positive for constipation. Negative for blood in stool and diarrhea.  Endocrine: Negative.  Negative for increased urination.  Genitourinary: Negative.  Negative for involuntary urination.  Musculoskeletal:  Positive for joint  pain, joint pain and morning stiffness. Negative for gait problem, joint swelling, myalgias, muscle weakness, muscle tenderness and myalgias.  Skin:  Positive for rash. Negative for color change, hair loss and sensitivity to sunlight.  Allergic/Immunologic: Negative.  Negative for susceptible to infections.  Neurological: Negative.  Negative for dizziness and headaches.  Hematological: Negative.  Negative for swollen glands.  Psychiatric/Behavioral:  Positive for sleep disturbance. Negative for depressed mood. The patient is not nervous/anxious.     PMFS History:  Patient Active Problem List   Diagnosis Date Noted   Psoriatic arthritis ([place]) 06/28/2022   Psoriasis 06/28/2022   Elevated LFTs 06/28/2022   Primary osteoarthritis of both knees 06/28/2022   Primary osteoarthritis of both feet 06/28/2022   Lumbar stenosis with neurogenic claudication 11/07/2018   Elevated liver enzymes 11/06/2016   Essential hypertension 05/08/2016   Diabetes ([place]) 05/08/2016    Past Medical History:  Diagnosis Date   Diabetes ([place]) 05/08/2016   Elevated liver enzymes 11/06/2016   Essential hypertension 05/08/2016   Squamous cell carcinoma, leg     Family History  Problem Relation Age of Onset   Diabetes Mother    Heart disease Mother    Thyroid disease Mother    Aneurysm Father    Hypertension Father    COPD Sister    Lung cancer Sister    Pancreatic cancer Sister    Past Surgical History:  Procedure Laterality Date   BACK SURGERY     SKIN CANCER  EXCISION  06/07/2022   leg   SURGERY OF LIP     Social History   Social History Narrative   Not on file    There is no immunization history on file for this patient.   Objective: Vital Signs: BP 117/74 (BP Location: Left Arm, Patient Position: Sitting, Cuff Size: Large)   Pulse 78   Resp 16   Ht '5\' 3"'$  (1.6 m)   Wt 186 lb 3.2 oz (84.5 kg)   BMI 32.98 kg/m    Physical Exam Vitals and nursing note reviewed.  Constitutional:       Appearance: She is well-developed.  HENT:     Head: Normocephalic and atraumatic.  Eyes:     Conjunctiva/sclera: Conjunctivae normal.  Cardiovascular:     Rate and Rhythm: Normal rate and regular rhythm.     Heart sounds: Normal heart sounds.  Pulmonary:     Effort: Pulmonary effort is normal.     Breath sounds: Normal breath sounds.  Abdominal:     General: Bowel sounds are normal.     Palpations: Abdomen is soft.  Musculoskeletal:     Cervical back: Normal range of motion.  Lymphadenopathy:     Cervical: No cervical adenopathy.  Skin:    General: Skin is warm and dry.     Capillary Refill: Capillary refill takes less than 2 seconds.     Comments: Ulcer noted on the right shin as shown in the picture below.  Neurological:     Mental Status: She is alert and oriented to person, place, and time.  Psychiatric:        Behavior: Behavior normal.      Musculoskeletal Exam: Cervical spine was in good range of motion.  There was no tenderness over thoracic or lumbar spine.  She had tenderness over bilateral SI joints.  Shoulders, elbows, wrist joints were in good range of motion.  She had tenderness over right first and second MCP joints.  Bilateral CMC PIP and DIP thickening was noted.  Hip joints and knee joints in good range of motion.  No warmth swelling or effusion was noted.  She had no tenderness over ankles or MTPs.  There was no planter fasciitis or Achilles tendinitis.  She had bilateral trochanteric bursa tenderness.  CDAI Exam: CDAI Score: -- Patient Global: --; Provider Global: -- Swollen: --; Tender: -- Joint Exam 06/28/2022   No joint exam has been documented for this visit   There is currently no information documented on the homunculus. Go to the Rheumatology activity and complete the homunculus joint exam.  Investigation: No additional findings.  Imaging: XR HIPS BILAT W OR W/O PELVIS 3-4 VIEWS  Result Date: 06/06/2022 No hip joint narrowing or SI joint  narrowing was noted.  Mild SI joint sclerosis was noted.  No erosive changes were noted.  Hardware was noted in the lumbar spine. Impression: These findings are consistent with osteoarthritic changes of the SI joints.  No hip joint narrowing was noted.  XR Foot 2 Views Left  Result Date: 06/06/2022 First MTP, PIP and DIP narrowing was noted.  No intertarsal, tibiotalar or subtalar joint space narrowing was noted.  Inferior and posterior calcaneal spurs were noted.  No erosive changes were noted. Impression: These findings are consistent with osteoarthritis of the foot.  XR Foot 2 Views Right  Result Date: 06/06/2022 First MTP narrowing can spurring was noted.  PIP and DIP narrowing was noted.  No intertarsal, tibiotalar or subtalar joint space narrowing was  noted.  Inferior and posterior calcaneal spurs were noted.  No erosive changes were noted. Impression: These findings are consistent with osteoarthritis of the foot.  XR KNEE 3 VIEW LEFT  Result Date: 06/06/2022 Mild medial compartment narrowing with lateral and intercondylar osteophytes was noted.  Moderate patellofemoral narrowing was noted.  No chondrocalcinosis was noted. Impression: These findings are consistent with mild osteoarthritis and moderate chondromalacia patella.  XR KNEE 3 VIEW RIGHT  Result Date: 06/06/2022 Mild medial compartment narrowing was noted.  Lateral and intercondylar osteophytes were noted.  Moderate patellofemoral narrowing was noted.  No chondrocalcinosis was noted. Impression: These findings are consistent with mild osteoarthritis and moderate chondromalacia patella.  XR Hand 2 View Left  Result Date: 06/06/2022 Severe CMC narrowing and subluxation with spurring was noted.  PIP and DIP narrowing was noted.  No intercarpal radiocarpal joint space narrowing was noted.  No erosive changes were noted. Impression: These findings are consistent with osteoarthritis of the hand.  XR Hand 2 View Right  Result Date:  06/06/2022 Severe CMC narrowing and subluxation with spurring was noted.  Second MCP narrowing was noted.  PIP and DIP narrowing was noted.  No intercarpal or radiocarpal joint space narrowing was noted.  Possible chondrocalcinosis was noted in the wrist joint.  New bone formation was noted around proximal phalanges.  No erosive changes were noted. Impression: These findings are consistent with osteoarthritis and inflammatory arthritis.   Recent Labs: Lab Results  Component Value Date   WBC 8.4 06/06/2022   HGB 15.8 (H) 06/06/2022   PLT 361 06/06/2022   NA 143 06/06/2022   K 3.9 06/06/2022   CL 97 (L) 06/06/2022   CO2 24 06/06/2022   GLUCOSE 105 (H) 06/06/2022   BUN 13 06/06/2022   CREATININE 0.79 06/06/2022   BILITOT 0.3 06/06/2022   ALKPHOS 52 11/04/2018   AST 123 (H) 06/06/2022   ALT 157 (H) 06/06/2022   PROT 8.2 (H) 06/06/2022   PROT 7.9 06/06/2022   ALBUMIN 4.4 11/04/2018   CALCIUM 10.2 06/06/2022   GFRAA >60 11/07/2018   QFTBGOLDPLUS NEGATIVE 06/06/2022  June 06, 2022 SPEP normal, immunoglobulins normal, TB Gold negative, hepatitis B-, hepatitis C negative, CK127, ESR 14, RF negative, anti-CCP negative   Speciality Comments: No specialty comments available.  Procedures:  No procedures performed Allergies: Sulfa antibiotics   Assessment / Plan:     Visit Diagnoses: Psoriatic arthritis ([place]) - Positive synovitis, history of recurrent swelling, sacroiliitis and trochanteric bursitis.  Patient continues to have pain and discomfort in multiple joints.  The synovitis on the right second and third MCP has improved although she still had tenderness.  We had detailed discussion regarding his Skyrizi at the last visit.  His Orson Ape has been approved by her insurance.  She had right lower extremity ulcer today.  I would like the ulcer to be healed prior to starting her on Skyrizi.  Psoriasis - Extensive psoriasis was noted over trunk and extremities.  Patient is followed by Dr.  Delman Cheadle.  She has been treated with topical agents.  High risk medication use -we will start her on Skyrizi after the lower extremity ulcer heals completely.  Elevated LFTs -patient believes the elevated LFTs has been due to use of NSAIDs and Tylenol.  I advised her to have repeat LFTs in 1 month.  Plan: AST, ALT,   History of ulcer of lower extremity-an ulceration was noted on the right lower extremity.  Patient states that the ulcer was very deep and has  been healing gradually.  She was treated with antibiotics.  Patient states that she had a staph infection.  I will request records from Dr. Delman Cheadle.  I will also refer her to wound clinic in [place] for convenience.  I explained to her that the ulcer has to be healed prior to starting [facility].  Patient voiced understanding.  Primary osteoarthritis of both hands -she had synovitis in her right second and third MCP joints.  The swelling has improved to some extent but she still had tenderness.  Clinical and x-ray findings were consistent with osteoarthritis and inflammatory arthritis overlap.    Chronic pain of both hips - Painful range of motion of bilateral hip joints.  She also had bilateral trochanteric bursitis.  X-rays of the hip joint were unremarkable except for mild osteoarthritic changes were noted in the SI joints.  Primary osteoarthritis of both knees -she continues to have some knee joint discomfort.  No synovitis was noted.  X-rays showed bilateral mild osteoarthritis and moderate chondromalacia patella was noted.  Primary osteoarthritis of both feet -she complains of discomfort in her feet.  No synovitis or dactylitis was noted.  Clinical and radiographic findings are consistent with osteoarthritis.  Chronic SI joint pain -she had tenderness over SI joints.  No erosive changes were noted.  SI joint x-rays were consistent with osteoarthritis.  Lumbar stenosis with neurogenic claudication - Status post fusion in 2020 by Dr.  Kathyrn Sheriff.  Myalgia - CK normal.  Essential hypertension-blood pressure was normal today.  History of diabetes mellitus  Cancer, skin, squamous cell-right lower extremity - She is scheduled for Mohs surgery.  Family history of systemic lupus erythematosus-paternal aunt and grand niece  Orders: Orders Placed This Encounter  Procedures   AST   ALT   AMB referral to wound care center   No orders of the defined types were placed in this encounter.    Follow-Up Instructions: Return in about 6 weeks (around 08/09/2022) for Psoriatic arthritis.   Bo Merino, MD  Note - This record has been created using Editor, commissioning.  Chart creation errors have been sought, but may not always  have been located. Such creation errors do not reflect on  the standard of medical care.

## 2022-06-20 NOTE — Telephone Encounter (Signed)
Received a fax from  [place] regarding an approval for [facility] patient assistance from 06/20/2022 to 05/21/2023. Approval letter sent to scan center.  Phone number: [phone]  Patient can be scheduled for Skyrizi new start  Knox Saliva, PharmD, MPH, BCPS, CPP Clinical Pharmacist (Rheumatology and Pulmonology)

## 2022-06-20 NOTE — Telephone Encounter (Signed)
Spoke with [facility] pt assistance rep. She stated this case has been assigned to someone and we should hear back by the end of the week.   Maryan Puls, PharmD PGY-1 [facility] Pharmacy Resident

## 2022-06-21 NOTE — Telephone Encounter (Signed)
Patient states she had procedure completed by Dr. 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 She was prescribed 10-day abx course. She will be completed with abx this Saturday.  Pt has f/u with Dr. Estanislado Pandy on 06/28/2022 morning. We can start her on Skyrizi after that visit if she is cleared by dermatology. She is not following up with them until 07/09/2022.  Advised her to call Dr. Maurie Boettcher office to request clearance and we can decide from there on timing of starting [place] and whether or not we have to delay until she is seen in their clinic again.  Knox Saliva, PharmD, MPH, BCPS, CPP Clinical Pharmacist (Rheumatology and Pulmonology)

## 2022-06-25 DIAGNOSIS — T8149XA Infection following a procedure, other surgical site, initial encounter: Secondary | ICD-10-CM | POA: Diagnosis not present

## 2022-06-25 DIAGNOSIS — Z5189 Encounter for other specified aftercare: Secondary | ICD-10-CM | POA: Diagnosis not present

## 2022-06-28 ENCOUNTER — Encounter: Payer: Self-pay | Admitting: Rheumatology

## 2022-06-28 ENCOUNTER — Ambulatory Visit: Payer: Medicare Other | Attending: Rheumatology | Admitting: Rheumatology

## 2022-06-28 VITALS — BP 117/74 | HR 78 | Resp 16 | Ht 63.0 in | Wt 186.2 lb

## 2022-06-28 DIAGNOSIS — R7989 Other specified abnormal findings of blood chemistry: Secondary | ICD-10-CM

## 2022-06-28 DIAGNOSIS — M25552 Pain in left hip: Secondary | ICD-10-CM

## 2022-06-28 DIAGNOSIS — M25551 Pain in right hip: Secondary | ICD-10-CM

## 2022-06-28 DIAGNOSIS — M19042 Primary osteoarthritis, left hand: Secondary | ICD-10-CM

## 2022-06-28 DIAGNOSIS — L4050 Arthropathic psoriasis, unspecified: Secondary | ICD-10-CM | POA: Diagnosis not present

## 2022-06-28 DIAGNOSIS — L409 Psoriasis, unspecified: Secondary | ICD-10-CM

## 2022-06-28 DIAGNOSIS — Z79899 Other long term (current) drug therapy: Secondary | ICD-10-CM | POA: Diagnosis not present

## 2022-06-28 DIAGNOSIS — I10 Essential (primary) hypertension: Secondary | ICD-10-CM

## 2022-06-28 DIAGNOSIS — G8929 Other chronic pain: Secondary | ICD-10-CM

## 2022-06-28 DIAGNOSIS — Z872 Personal history of diseases of the skin and subcutaneous tissue: Secondary | ICD-10-CM

## 2022-06-28 DIAGNOSIS — C4492 Squamous cell carcinoma of skin, unspecified: Secondary | ICD-10-CM

## 2022-06-28 DIAGNOSIS — M19041 Primary osteoarthritis, right hand: Secondary | ICD-10-CM

## 2022-06-28 DIAGNOSIS — M48062 Spinal stenosis, lumbar region with neurogenic claudication: Secondary | ICD-10-CM

## 2022-06-28 DIAGNOSIS — M19072 Primary osteoarthritis, left ankle and foot: Secondary | ICD-10-CM

## 2022-06-28 DIAGNOSIS — M170 Bilateral primary osteoarthritis of knee: Secondary | ICD-10-CM

## 2022-06-28 DIAGNOSIS — Z8269 Family history of other diseases of the musculoskeletal system and connective tissue: Secondary | ICD-10-CM

## 2022-06-28 DIAGNOSIS — M19071 Primary osteoarthritis, right ankle and foot: Secondary | ICD-10-CM

## 2022-06-28 DIAGNOSIS — M533 Sacrococcygeal disorders, not elsewhere classified: Secondary | ICD-10-CM

## 2022-06-28 DIAGNOSIS — M791 Myalgia, unspecified site: Secondary | ICD-10-CM

## 2022-06-28 DIAGNOSIS — Z8639 Personal history of other endocrine, nutritional and metabolic disease: Secondary | ICD-10-CM

## 2022-06-28 NOTE — Patient Instructions (Addendum)
Please get AST and ALT in 1 month   Standing Labs We placed an order today for your standing lab work.   Please have your standing labs drawn in 1 month after starting Skyrizi and then every 3 months  Please have your labs drawn 2 weeks prior to your appointment so that the provider can discuss your lab results at your appointment.  Please note that you may see your imaging and lab results in [facility] before we have reviewed them. We will contact you once all results are reviewed. Please allow our office up to 72 hours to thoroughly review all of the results before contacting the office for clarification of your results.  Lab hours are:   Monday through Thursday from 8:00 am -12:30 pm and 1:00 pm-5:00 pm and Friday from 8:00 am-12:00 pm.  Please be advised, all patients with office appointments requiring lab work will take precedent over walk-in lab work.   Labs are drawn by [facility]. Please bring your co-pay at the time of your lab draw.  You may receive a bill from [place] for your lab work.  Please note if you are on Hydroxychloroquine and and an order has been placed for a Hydroxychloroquine level, you will need to have it drawn 4 hours or more after your last dose.  If you wish to have your labs drawn at another location, please call the office 24 hours in advance so we can fax the orders.  The office is located at [address] [place], [place], [place] [address] No appointment is necessary.    If you have any questions regarding directions or hours of operation,  please call [phone].   As a reminder, please drink plenty of water prior to coming for your lab work. Thanks!   Vaccines You are taking a medication(s) that can suppress your immune system.  The following immunizations are recommended: Flu annually Covid-19  rsv Td/Tdap (tetanus, diphtheria, pertussis) every 10 years Pneumonia (Prevnar 15 then Pneumovax 23 at least 1 year apart.  Alternatively, can take  Prevnar 20 without needing additional dose) Shingrix: 2 doses from 4 weeks to 6 months apart  Please check with your PCP to make sure you are up to date.   If you have signs or symptoms of an infection or start antibiotics: First, call your PCP for workup of your infection. Hold your medication through the infection, until you complete your antibiotics, and until symptoms resolve if you take the following: Injectable medication (Actemra, Benlysta, Cimzia, Cosentyx, Enbrel, Humira, Kevzara, Orencia, Remicade, Simponi, Stelara, Taltz, Tremfya) Methotrexate Leflunomide (Arava) Mycophenolate (Cellcept) Morrie Sheldon, Olumiant, or Rinvoq

## 2022-06-28 NOTE — Telephone Encounter (Signed)
Patient has ulcer that will need wound care. Pt has f/u in 6 weeks and will plan to start [place] at that visit if in morning.  Knox Saliva, PharmD, MPH, BCPS, CPP Clinical Pharmacist (Rheumatology and Pulmonology)

## 2022-07-10 ENCOUNTER — Encounter (HOSPITAL_BASED_OUTPATIENT_CLINIC_OR_DEPARTMENT_OTHER): Payer: Medicare Other | Attending: General Surgery | Admitting: General Surgery

## 2022-07-10 DIAGNOSIS — L97812 Non-pressure chronic ulcer of other part of right lower leg with fat layer exposed: Secondary | ICD-10-CM | POA: Insufficient documentation

## 2022-07-10 DIAGNOSIS — Y838 Other surgical procedures as the cause of abnormal reaction of the patient, or of later complication, without mention of misadventure at the time of the procedure: Secondary | ICD-10-CM | POA: Diagnosis not present

## 2022-07-10 DIAGNOSIS — I10 Essential (primary) hypertension: Secondary | ICD-10-CM | POA: Insufficient documentation

## 2022-07-10 DIAGNOSIS — T8189XA Other complications of procedures, not elsewhere classified, initial encounter: Secondary | ICD-10-CM | POA: Diagnosis not present

## 2022-07-10 DIAGNOSIS — M48062 Spinal stenosis, lumbar region with neurogenic claudication: Secondary | ICD-10-CM | POA: Diagnosis not present

## 2022-07-10 DIAGNOSIS — L4059 Other psoriatic arthropathy: Secondary | ICD-10-CM | POA: Insufficient documentation

## 2022-07-10 DIAGNOSIS — E11622 Type 2 diabetes mellitus with other skin ulcer: Secondary | ICD-10-CM | POA: Diagnosis not present

## 2022-07-11 NOTE — Progress Notes (Addendum)
Dorothy Robbins ([id number]) [id number] Page 1 of 8 Visit Report for 07/10/2022 Chief Complaint Document Details Patient Name: Date of Service: Dorothy Robbins 07/10/2022 8:00 A M Medical Record Number: [id number] Patient Account Number: [id number] Date of Birth/Sex: Treating RN: Dorothy Robbins (72 y.o. F) Primary Care Provider: [place] RRO Edwyna Shell, [place] Other Clinician: Referring Provider: Treating Provider/Extender: Candis Musa in Treatment: 0 Information Obtained from: Patient Chief Complaint Patient presents to the wound care center with open non-healing surgical wound(s) Electronic Signature(s) Signed: 07/10/2022 9:04:22 AM By: Fredirick Maudlin MD FACS Entered By: Fredirick Maudlin on 07/10/2022 09:04:22 -------------------------------------------------------------------------------- Debridement Details Patient Name: Date of Service: Dorothy Robbins, NA Dorothy S. 07/10/2022 8:00 A M Medical Record Number: [id number] Patient Account Number: [id number] Date of Birth/Sex: Treating RN: Dorothy Robbins (9 y.o. F) [name] Primary Care Provider: [place] RRO Edwyna Shell, [place] Other Clinician: Referring Provider: Treating Provider/Extender: Candis Musa in Treatment: 0 Debridement Performed for Assessment: Wound #1 Right,Anterior Lower Leg Performed By: Physician Fredirick Maudlin, MD Debridement Type: Debridement Level of Consciousness (Pre-procedure): Awake and Alert Pre-procedure Verification/Time Out Yes - 08:48 Taken: Start Time: 08:48 Pain Control: Lidocaine 5% topical ointment T Area Debrided (L x W): otal 2 (cm) x 2.5 (cm) = 5 (cm) Tissue and other material debrided: Non-Viable, Slough, Slough Level: Non-Viable Tissue Debridement Description: Selective/Open Wound Instrument: Curette Bleeding: Minimum Hemostasis Achieved: Pressure End Time: 08:59 Procedural Pain: 0 Post Procedural Pain: 0 Response to Treatment:  Procedure was tolerated well Level of Consciousness (Post- Awake and Alert procedure): Post Debridement Measurements of Total Wound Length: (cm) 2 Width: (cm) 2.5 Depth: (cm) 0.1 Volume: (cm) 0.393 Character of Wound/Ulcer Post Debridement: Improved Post Procedure Diagnosis Same as Pre-procedure Notes Scribed for Dr. Celine Ahr by J.Scotton Electronic Signature(s) Signed: 07/10/2022 9:17:42 AM By: Fredirick Maudlin MD FACS Signed: 07/10/2022 10:58:52 AM By: [name] RN Dorothy Robbins, Dorothy Robbins ([id number]) [phone].pdf Page 2 of 8 Entered By: [name] on 07/10/2022 09:00:37 -------------------------------------------------------------------------------- HPI Details Patient Name: Date of Service: Dorothy Robbins 07/10/2022 8:00 A M Medical Record Number: [id number] Patient Account Number: [id number] Date of Birth/Sex: Treating RN: Dorothy Robbins (72 y.o. F) Primary Care Provider: [place] RRO Edwyna Shell, [place] Other Clinician: Referring Provider: Treating Provider/Extender: Candis Musa in Treatment: 0 History of Present Illness HPI Description: ADMISSION 07/10/2022 This is a 72 year old woman with type 2 diabetes, well-controlled on oral agents and psoriatic arthritis. She underwent a Mohs procedure on January 18 for squamous cell carcinoma on her leg. The wound became infected with Staph aureus. She was treated with topical mupirocin and doxycycline. She is no longer using either of these. She is due to start [place] for her psoriatic arthritis, but her rheumatologist wants her wound to heal before initiating this treatment. She has been applying Vaseline and a Band-Aid, per her dermatologist recommendation. On her right anterior tibial surface, there is a circular wound with perimeter healing already present. There is a layer of rubbery slough. There is no erythema, induration, purulent drainage, or other signs of infection. Electronic  Signature(s) Signed: 07/10/2022 9:06:47 AM By: Fredirick Maudlin MD FACS Entered By: Fredirick Maudlin on 07/10/2022 09:06:47 -------------------------------------------------------------------------------- Physical Exam Details Patient Name: Date of Service: Dorothy Robbins, Dorothy Dodrill S. 07/10/2022 8:00 A M Medical Record Number: [id number] Patient Account Number: [id number] Date of Birth/Sex: Treating RN: Dorothy Robbins (72 y.o. F) Primary Care Provider: [place] RRO Edwyna Shell, [place] Other Clinician: Referring Provider: Treating Provider/Extender: Candis Musa in Treatment: 0 Constitutional . . . Marland Kitchen  No acute distress. Respiratory Normal work of breathing on room air. Notes 07/10/2022: On her right anterior tibial surface, there is a circular wound with perimeter healing already present. There is a layer of rubbery slough. There is no erythema, induration, purulent drainage, or other signs of infection. Electronic Signature(s) Signed: 07/10/2022 9:07:21 AM By: Fredirick Maudlin MD FACS Entered By: Fredirick Maudlin on 07/10/2022 09:07:20 -------------------------------------------------------------------------------- Physician Orders Details Patient Name: Date of Service: Dorothy Robbins, NA Dorothy S. 07/10/2022 8:00 A M Medical Record Number: [id number] Patient Account Number: [id number] Date of Birth/Sex: Treating RN: Dorothy Robbins (72 y.o. Dorothy Robbins Primary Care Provider: [place] RRO Edwyna Shell, [place] Other Clinician: Referring Provider: Treating Provider/Extender: Candis Musa in Treatment: 0 Verbal / Phone Orders: No Diagnosis Coding ICD-10 Coding Code Description Dorothy Robbins, Dorothy Robbins ([id number]) [id number] Page 3 of 8 [phone] Non-pressure chronic ulcer of other part of right lower leg with fat layer exposed E11.9 Type 2 diabetes mellitus without complications [id number] Essential (primary) hypertension M48.062 Spinal stenosis, lumbar region with  neurogenic claudication L40.59 Other psoriatic arthropathy Follow-up Appointments ppointment in 1 week. - Dr. Celine Ahr Room 3 Return A Anesthetic (In clinic) Topical Lidocaine 5% applied to wound bed - Used in Clinic Bathing/ Shower/ Hygiene May shower with protection but do not get wound dressing(s) wet. Protect dressing(s) with water repellant cover (for example, large plastic bag) or a cast cover and may then take shower. - +++ Do not get Right lower leg wet+++ Use a cast protector or take sponge baths. Cast Protectors can bre purchased from [employer], [employer] supply store. Costs range between $14-$27 Edema Control - Lymphedema / SCD / Other Avoid standing for long periods of time. Other Edema Control Orders/Instructions: - Wear compression stocking on left leg. Then when right leg is healed use compression stocking on right leg. Additional Orders / Instructions Follow Nutritious Diet - Please try and increase your daily protein intake to about 60-80gramms per day Wound Treatment Wound #1 - Lower Leg Wound Laterality: Right, Anterior Cleanser: Soap and Water 1 x Per Week/30 Days Discharge Instructions: May shower and wash wound with dial antibacterial soap and water prior to dressing change. Cleanser: Wound Cleanser 1 x Per Week/30 Days Discharge Instructions: Cleanse the wound with wound cleanser prior to applying a clean dressing using gauze sponges, not tissue or cotton balls. Peri-Wound Care: Sween Lotion (Moisturizing lotion) 1 x Per Week/30 Days Discharge Instructions: Apply moisturizing lotion as directed Prim Dressing: Sorbalgon AG Dressing 2x2 (in/in) 1 x Per Week/30 Days ary Discharge Instructions: Apply to wound bed as instructed Secondary Dressing: ABD Pad, 8x10 1 x Per Week/30 Days Discharge Instructions: Apply over primary dressing as directed. Secondary Dressing: Woven Gauze Sponge, Non-Sterile 4x4 in 1 x Per Week/30 Days Discharge Instructions: Apply over  primary dressing as directed. Secured With: Transpore Surgical Tape, 2x10 (in/yd) 1 x Per Week/30 Days Discharge Instructions: Secure dressing with tape as directed. Compression Wrap: ThreePress (3 layer compression wrap) 1 x Per Week/30 Days Discharge Instructions: Apply three layer compression as directed. Compression Wrap: Tubular netting #5 1 x Per Week/30 Days Electronic Signature(s) Signed: 07/10/2022 9:17:42 AM By: Fredirick Maudlin MD FACS Signed: 07/10/2022 10:58:52 AM By: [name] RN Previous Signature: 07/10/2022 9:09:24 AM Version By: Fredirick Maudlin MD FACS Entered By: [name] on 07/10/2022 09:11:50 -------------------------------------------------------------------------------- Problem List Details Patient Name: Date of Service: Dorothy Robbins, NA Dorothy S. 07/10/2022 8:00 A M Medical Record Number: [id number] Patient Account Number: [id number] Date of Birth/Sex: Treating RN: [birth year]-Robbins (71  y.o. F) Primary Care Provider: [place] RRO Edwyna Shell, [place] Other Clinician: Referring Provider: Treating Provider/Extender: Candis Musa in Treatment: [address] Dorothy Robbins, Dorothy Robbins ([id number]) [id number] Page 4 of 8 ICD-10 Encounter Code Description Active Date MDM Diagnosis L97.812 Non-pressure chronic ulcer of other part of right lower leg with fat layer 07/10/2022 No Yes exposed E11.9 Type 2 diabetes mellitus without complications [id number] No Yes I10 Essential (primary) hypertension 07/10/2022 No Yes M48.062 Spinal stenosis, lumbar region with neurogenic claudication 07/10/2022 No Yes L40.59 Other psoriatic arthropathy 07/10/2022 No Yes Inactive Problems Resolved Problems Electronic Signature(s) Signed: 07/10/2022 9:03:43 AM By: Fredirick Maudlin MD FACS Previous Signature: 07/10/2022 7:43:43 AM Version By: Fredirick Maudlin MD FACS Entered By: Fredirick Maudlin on 07/10/2022  09:03:43 -------------------------------------------------------------------------------- Progress Note Details Patient Name: Date of Service: Dorothy Robbins, NA Dorothy S. 07/10/2022 8:00 A M Medical Record Number: [id number] Patient Account Number: [id number] Date of Birth/Sex: Treating RN: Rob[birth year] (72 y.o. F) Primary Care Provider: [place] RRO Edwyna Shell, [place] Other Clinician: Referring Provider: Treating Provider/Extender: Candis Musa in Treatment: 0 Subjective Chief Complaint Information obtained from Patient Patient presents to the wound care center with open non-healing surgical wound(s) History of Present Illness (HPI) ADMISSION 07/10/2022 This is a 72 year old woman with type 2 diabetes, well-controlled on oral agents and psoriatic arthritis. She underwent a Mohs procedure on January 18 for squamous cell carcinoma on her leg. The wound became infected with Staph aureus. She was treated with topical mupirocin and doxycycline. She is no longer using either of these. She is due to start [place] for her psoriatic arthritis, but her rheumatologist wants her wound to heal before initiating this treatment. She has been applying Vaseline and a Band-Aid, per her dermatologist recommendation. On her right anterior tibial surface, there is a circular wound with perimeter healing already present. There is a layer of rubbery slough. There is no erythema, induration, purulent drainage, or other signs of infection. Patient History Information obtained from Patient. Allergies Statins-HMG-CoA Reductase Inhibitors (Severity: Severe), Sulfa (Sulfonamide Antibiotics) Family History Unknown History. Social History Never smoker, Marital Status - Married, Alcohol Use - Rarely, Drug Use - No History, Caffeine Use - Daily - coffee. Medical History Cardiovascular Dorothy Robbins, Dorothy Robbins ([id number]) [id number] Page 5 of 8 Patient has history of  Hypertension Endocrine Patient has history of Type II Diabetes Musculoskeletal Patient has history of Osteoarthritis - Hips,feet, back and knees Patient is treated with Oral Agents. Blood sugar is tested. Hospitalization/Surgery History - Mohs Surgery 06/07/22;back surgery;lip surgery. Medical A Surgical History Notes nd Musculoskeletal Hx: Psoriatic arthritis Oncologic R Lower Leg- Squamous cell carcinoma Review of Systems (ROS) Constitutional Symptoms (General Health) Denies complaints or symptoms of Fatigue, Fever, Chills, Marked Weight Change. Eyes Denies complaints or symptoms of Dry Eyes, Vision Changes, Glasses / Contacts. Integumentary (Skin) Complains or has symptoms of Wounds - Right Lower Leg. Objective Constitutional No acute distress. Vitals Time Taken: 8:05 AM, Height: 64 in, Weight: 185 lbs, BMI: 31.8, Temperature: 98.6 F, Pulse: 87 bpm, Respiratory Rate: 18 breaths/min, Blood Pressure: 132/80 mmHg. Respiratory Normal work of breathing on room air. General Notes: 07/10/2022: On her right anterior tibial surface, there is a circular wound with perimeter healing already present. There is a layer of rubbery slough. There is no erythema, induration, purulent drainage, or other signs of infection. Integumentary (Hair, Skin) Wound #1 status is Open. Original cause of wound was Surgical Injury. The date acquired was: 06/14/2022. The wound is located on the Right,Anterior Lower Leg. The  wound measures 2cm length x 2.5cm width x 0.1cm depth; 3.927cm^2 area and 0.393cm^3 volume. There is Fat Layer (Subcutaneous Tissue) exposed. There is no tunneling or undermining noted. There is a medium amount of drainage noted. There is large (67-100%) red, hyper - granulation within the wound bed. There is a small (1-33%) amount of necrotic tissue within the wound bed including Adherent Slough. The periwound skin appearance had no abnormalities noted for moisture. The periwound skin  appearance had no abnormalities noted for color. The periwound skin appearance exhibited: Scarring. Periwound temperature was noted as No Abnormality. Assessment Active Problems ICD-10 Non-pressure chronic ulcer of other part of right lower leg with fat layer exposed Type 2 diabetes mellitus without complications Essential (primary) hypertension Spinal stenosis, lumbar region with neurogenic claudication Other psoriatic arthropathy Procedures Wound #1 Pre-procedure diagnosis of Wound #1 is an Atypical located on the Right,Anterior Lower Leg . There was a Selective/Open Wound Non-Viable Tissue Debridement with a total area of 5 sq cm performed by Fredirick Maudlin, MD. With the following instrument(s): Curette to remove Non-Viable tissue/material. Material removed includes [facility] after achieving pain control using Lidocaine 5% topical ointment. No specimens were taken. A time out was conducted at 08:48, prior to the start of the procedure. A Minimum amount of bleeding was controlled with Pressure. The procedure was tolerated well with a pain level of 0 throughout and a pain level of 0 following the procedure. Post Debridement Measurements: 2cm length x 2.5cm width x 0.1cm depth; 0.393cm^3 volume. Character of Wound/Ulcer Post Debridement is improved. Post procedure Diagnosis Wound #1: Same as Pre-Procedure General Notes: Scribed for Dr. Celine Ahr by J.Scotton. Pre-procedure diagnosis of Wound #1 is an Atypical located on the Right,Anterior Lower Leg . There was a Three Layer Compression Therapy Procedure by [name], RN. Dorothy Robbins, Dorothy Robbins ([id number]) [id number] Page 6 of 8 Post procedure Diagnosis Wound #1: Same as Pre-Procedure Plan Follow-up Appointments: Return Appointment in 1 week. - Dr. Celine Ahr Room 3 Anesthetic: (In clinic) Topical Lidocaine 5% applied to wound bed - Used in Clinic Bathing/ Shower/ Hygiene: May shower with protection but do not get  wound dressing(s) wet. Protect dressing(s) with water repellant cover (for example, large plastic bag) or a cast cover and may then take shower. - +++ Do not get Right lower leg wet+++ Use a cast protector or take sponge baths. Cast Protectors can bre purchased from [employer], [employer] supply store. Costs range between $14-$27 Edema Control - Lymphedema / SCD / Other: Avoid standing for long periods of time. Other Edema Control Orders/Instructions: - Wear compression stocking on left leg. Then when right leg is healed use compression stocking on right leg. Additional Orders / Instructions: Follow Nutritious Diet - Please try and increase your daily protein intake to about 60-80gramms per day WOUND #1: - Lower Leg Wound Laterality: Right, Anterior Cleanser: Soap and Water 1 x Per Week/30 Days Discharge Instructions: May shower and wash wound with dial antibacterial soap and water prior to dressing change. Cleanser: Wound Cleanser 1 x Per Week/30 Days Discharge Instructions: Cleanse the wound with wound cleanser prior to applying a clean dressing using gauze sponges, not tissue or cotton balls. Peri-Wound Care: Sween Lotion (Moisturizing lotion) 1 x Per Week/30 Days Discharge Instructions: Apply moisturizing lotion as directed Prim Dressing: Sorbalgon AG Dressing 2x2 (in/in) 1 x Per Week/30 Days ary Discharge Instructions: Apply to wound bed as instructed Secondary Dressing: ABD Pad, 8x10 1 x Per Week/30 Days Discharge Instructions: Apply over primary dressing as directed. Secondary  Dressing: Woven Gauze Sponge, Non-Sterile 4x4 in 1 x Per Week/30 Days Discharge Instructions: Apply over primary dressing as directed. Secured With: Transpore Surgical T ape, 2x10 (in/yd) 1 x Per Week/30 Days Discharge Instructions: Secure dressing with tape as directed. Com pression Wrap: ThreePress (3 layer compression wrap) 1 x Per Week/30 Days Discharge Instructions: Apply three layer compression as  directed. Com pression Wrap: Tubular netting #5 1 x Per Week/30 Days 07/10/2022: This is a 72 year old woman with a nonhealing Mohs defect. On her right anterior tibial surface, there is a circular wound with perimeter healing already present. There is a layer of rubbery slough. There is no erythema, induration, purulent drainage, or other signs of infection. I used a curette to debride the slough from her wound. There is good granulation tissue underlying this. I am going to apply silver alginate and 3 layer compression. She has already been elevating her legs regularly and I encouraged her to continue this, along with making sure she gets adequate protein intake. We discussed strategies for achieving this. I would like her to get at least 60 to 80 g of protein per day. She will follow-up in 1 week. Electronic Signature(s) Signed: 07/16/2022 4:58:37 PM By: Deon Pilling RN, BSN Signed: 07/16/2022 5:09:54 PM By: Fredirick Maudlin MD FACS Previous Signature: 07/10/2022 9:10:22 AM Version By: Fredirick Maudlin MD FACS Entered By: Deon Pilling on 07/16/2022 14:31:14 -------------------------------------------------------------------------------- HxROS Details Patient Name: Date of Service: Dorothy Robbins, NA Dorothy S. 07/10/2022 8:00 A M Medical Record Number: [id number] Patient Account Number: [id number] Date of Birth/Sex: Treating RN: Rob[birth year] (72 y.o. Dorothy Robbins Primary Care Provider: [place] RRO Edwyna Shell, [place] Other Clinician: Referring Provider: Treating Provider/Extender: Candis Musa in Treatment: 0 Information Obtained From Patient Constitutional Symptoms (General Health) Complaints and Symptoms: Negative for: Fatigue; Fever; Chills; Marked Weight Change Dorothy Robbins, Dorothy Robbins ([id number]) [phone].pdf Page 7 of 8 Eyes Complaints and Symptoms: Negative for: Dry Eyes; Vision Changes; Glasses / Contacts Integumentary (Skin) Complaints and  Symptoms: Positive for: Wounds - Right Lower Leg Cardiovascular Medical History: Positive for: Hypertension Endocrine Medical History: Positive for: Type II Diabetes Time with diabetes: 10 years Treated with: Oral agents Blood sugar tested every day: Yes Tested : Every day Musculoskeletal Medical History: Positive for: Osteoarthritis - Hips,feet, back and knees Past Medical History Notes: Hx: Psoriatic arthritis Oncologic Medical History: Past Medical History Notes: R Lower Leg- Squamous cell carcinoma Immunizations Pneumococcal Vaccine: Received Pneumococcal Vaccination: Yes Received Pneumococcal Vaccination On or After 60th Birthday: Yes Implantable Devices None Hospitalization / Surgery History Type of Hospitalization/Surgery Mohs Surgery 06/07/22;back surgery;lip surgery Family and Social History Unknown History: Yes; Never smoker; Marital Status - Married; Alcohol Use: Rarely; Drug Use: No History; Caffeine Use: Daily - coffee; Financial Concerns: No; Food, Clothing or Shelter Needs: No; Support System Lacking: No; Transportation Concerns: No Electronic Signature(s) Signed: 07/10/2022 8:37:44 AM By: Fredirick Maudlin MD FACS Signed: 07/10/2022 10:58:52 AM By: [name] RN Entered By: [name] on 07/10/2022 08:30:55 -------------------------------------------------------------------------------- SuperBill Details Patient Name: Date of Service: Dorothy Robbins, Dorothy Robbins 07/10/2022 Medical Record Number: [id number] Patient Account Number: [id number] Date of Birth/Sex: Treating RN: [birth year] Robbins (72 y.o. F) Primary Care Provider: [place] RRO Edwyna Shell, [place] Other Clinician: Referring Provider: Treating Provider/Extender: Candis Musa in Treatment: 0 Diagnosis Coding ICD-10 Codes Code Description [phone] Non-pressure chronic ulcer of other part of right lower leg with fat layer exposed Dorothy Robbins, Dorothy Robbins ([id number])  [id number].pdf Page 8 of 8 E11.9 Type 2 diabetes mellitus  without complications [id number] Essential (primary) hypertension M48.062 Spinal stenosis, lumbar region with neurogenic claudication L40.59 Other psoriatic arthropathy Facility Procedures : CPT4 Code: [id number] Description: [id number] - WOUND CARE VISIT-LEV 4 EST PT Modifier: 25 Quantity: 1 : CPT4 Code: [id number] Description: [id number] - DEBRIDE WOUND 1ST 20 SQ CM OR < ICD-10 Diagnosis Description [id number] Non-pressure chronic ulcer of other part of right lower leg with fat layer expos Modifier: ed Quantity: 1 Physician Procedures : CPT4 Code Description Modifier [id number] - WC PHYS LEVEL 4 - NEW PT 25 ICD-10 Diagnosis Description [id number] Non-pressure chronic ulcer of other part of right lower leg with fat layer exposed E11.9 Type 2 diabetes mellitus without complications  [id number] Other psoriatic arthropathy I10 Essential (primary) hypertension Quantity: 1 : [id number] - WC PHYS DEBR WO ANESTH 20 SQ CM ICD-10 Diagnosis Description [id number] Non-pressure chronic ulcer of other part of right lower leg with fat layer exposed Quantity: 1 Electronic Signature(s) Signed: 07/10/2022 10:58:52 AM By: [name] RN Signed: 07/10/2022 11:01:07 AM By: Fredirick Maudlin MD FACS Previous Signature: 07/10/2022 9:10:41 AM Version By: Fredirick Maudlin MD FACS Entered By: [name] on 07/10/2022 10:52:35

## 2022-07-11 NOTE — Progress Notes (Signed)
AKARA, NEEDLES ([id number]) [id number] Page 1 of 8 Visit Report for 07/10/2022 Allergy List Details Patient Name: Date of Service: Dorothy Robbins 07/10/2022 8:00 A M Medical Record Number: [id number] Patient Account Number: [id number] Date of Birth/Sex: Treating RN: 1950/11/15 (72 y.o. Dorothy Robbins Primary Care Jaquae Rieves: [place] RRO Edwyna [place], [place] Other Clinician: Referring Telsa Dillavou: Treating Zailah Zagami/Extender: Dorothy Robbins Weeks in Treatment: 0 Allergies Active Allergies Statins-HMG-CoA Reductase Inhibitors Severity: Severe Sulfa (Sulfonamide Antibiotics) Allergy Notes Electronic Signature(Robbins) Signed: 07/10/2022 10:58:52 AM By: [name] RN Entered By: [name] on 07/10/2022 08:18:53 -------------------------------------------------------------------------------- Arrival Information Details Patient Name: Date of Service: Dorothy Robbins, Dorothy Dodrill Robbins. 07/10/2022 8:00 A M Medical Record Number: [id number] Patient Account Number: [id number] Date of Birth/Sex: Treating RN: 11-21-1950 (72 y.o. Dorothy Robbins Primary Care Darnel Mchan: [place] RRO Edwyna [place], [place] Other Clinician: Referring Alva Broxson: Treating Zikeria Keough/Extender: Candis Musa in Treatment: 0 Visit Information Patient Arrived: Ambulatory Arrival Time: 08:02 Accompanied By: spouse Transfer Assistance: None Patient Identification Verified: Yes Electronic Signature(Robbins) Signed: 07/10/2022 10:58:52 AM By: [name] RN Entered By: [name] on 07/10/2022 08:02:44 -------------------------------------------------------------------------------- Clinic Level of Care Assessment Details Patient Name: Date of Service: Dorothy Robbins. 07/10/2022 8:00 [place] Record Number: [id number] Patient Account Number: [id number] ARTHELLA, VERNET ([id number]) [phone].pdf Page 2 of 8 Date of Birth/Sex: Treating RN: 12-Jul-1950 (72 y.o.  Dorothy Robbins Primary Care Itzayana Pardy: Other Clinician: [place] Edsel Petrin Referring Kanyon Bunn: Treating Evyn Putzier/Extender: Candis Musa in Treatment: 0 Clinic Level of Care Assessment Items TOOL 1 Quantity Score X- 1 0 Use when EandM and Procedure is performed on INITIAL visit ASSESSMENTS - Nursing Assessment / Reassessment X- 1 20 General Physical Exam (combine w/ comprehensive assessment (listed just below) when performed on new pt. evals) X- 1 25 Comprehensive Assessment (HX, ROS, Risk Assessments, Wounds Hx, etc.) ASSESSMENTS - Wound and Skin Assessment / Reassessment X- 1 10 Dermatologic / Skin Assessment (not related to wound area) ASSESSMENTS - Ostomy and/or Continence Assessment and Care []$  - 0 Incontinence Assessment and Management []$  - 0 Ostomy Care Assessment and Management (repouching, etc.) PROCESS - Coordination of Care X - Simple Patient / Family Education for ongoing care 1 15 []$  - 0 Complex (extensive) Patient / Family Education for ongoing care X- 1 10 Staff obtains Programmer, systems, Records, T Results / Process Orders est X- 1 10 Staff telephones HHA, Nursing Homes / Clarify orders / etc []$  - 0 Routine Transfer to another Facility (non-emergent condition) []$  - 0 Routine Hospital Admission (non-emergent condition) X- 1 15 New Admissions / Biomedical engineer / Ordering NPWT Apligraf, etc. , []$  - 0 Emergency Hospital Admission (emergent condition) PROCESS - Special Needs []$  - 0 Pediatric / Minor Patient Management []$  - 0 Isolation Patient Management []$  - 0 Hearing / Language / Visual special needs []$  - 0 Assessment of Community assistance (transportation, D/C planning, etc.) []$  - 0 Additional assistance / Altered mentation []$  - 0 Support Surface(Robbins) Assessment (bed, cushion, seat, etc.) INTERVENTIONS - Miscellaneous []$  - 0 External ear exam []$  - 0 Patient Transfer (multiple staff / Civil Service fast streamer / Similar devices) []$  -  0 Simple Staple / Suture removal (25 or less) []$  - 0 Complex Staple / Suture removal (26 or more) []$  - 0 Hypo/Hyperglycemic Management (do not check if billed separately) X- 1 15 Ankle / Brachial Index (ABI) - do not check if billed separately Has the patient been seen at the hospital  within the last three years: Yes Total Score: 120 Level Of Care: New/Established - Level 4 Electronic Signature(Robbins) Signed: 07/10/2022 10:58:52 AM By: [name] RN Entered By: [name] on 07/10/2022 10:52:20 Dorothy Robbins ([id number]) [phone].pdf Page 3 of 8 -------------------------------------------------------------------------------- Compression Therapy Details Patient Name: Date of Service: Dorothy Robbins 07/10/2022 8:00 A M Medical Record Number: [id number] Patient Account Number: [id number] Date of Birth/Sex: Treating RN: [birth year] (72 y.o. Dorothy Robbins Primary Care Jahsiah Carpenter: [place] RRO Edwyna [place], [place] Other Clinician: Referring Riyana Biel: Treating Madelon Welsch/Extender: Candis Musa in Treatment: 0 Compression Therapy Performed for Wound Assessment: Wound #1 Right,Anterior Lower Leg Performed By: Clinician [name], RN Compression Type: Three Layer Post Procedure Diagnosis Same as Pre-procedure Electronic Signature(Robbins) Signed: 07/10/2022 10:58:52 AM By: [name] RN Entered By: [name] on 07/10/2022 09:04:25 -------------------------------------------------------------------------------- Encounter Discharge Information Details Patient Name: Date of Service: Dorothy Robbins, Dorothy NCY Robbins. 07/10/2022 8:00 A M Medical Record Number: [id number] Patient Account Number: [id number] Date of Birth/Sex: Treating RN: [birth year] (72 y.o. Dorothy Robbins Primary Care Recia Sons: [place] RRO Edwyna [place], [place] Other Clinician: Referring Myrissa Chipley: Treating Altonio Schwertner/Extender: Candis Musa in Treatment: 0 Encounter  Discharge Information Items Post Procedure Vitals Discharge Condition: Stable Temperature (F): 98.6 Ambulatory Status: Ambulatory Pulse (bpm): 87 Discharge Destination: Home Respiratory Rate (breaths/min): 18 Transportation: Private Auto Blood Pressure (mmHg): 132/80 Accompanied By: spouse Schedule Follow-up Appointment: Yes Clinical Summary of Care: Patient Declined Electronic Signature(Robbins) Signed: 07/10/2022 10:58:52 AM By: [name] RN Entered By: [name] on 07/10/2022 10:57:06 -------------------------------------------------------------------------------- Lower Extremity Assessment Details Patient Name: Date of Service: Dorothy Mocha Robbins. 07/10/2022 8:00 A M Medical Record Number: [id number] Patient Account Number: [id number] Date of Birth/Sex: Treating RN: 1951/05/01 (72 y.o. Dorothy Robbins Primary Care Tanicka Bisaillon: [place] RRO Edwyna [place], [place] Other Clinician: Referring Lamarr Feenstra: Treating Oluwadamilola Rosamond/Extender: Dorothy Robbins Weeks in Treatment: 0 Edema Assessment Assessed: [Left: No] [Right: No] [Left: Edema] [Right: :] Dorothy Robbins, Dorothy Robbins ([id number]) [id number] Page 4 of 8 Left: Right: Point of Measurement: 30 cm From Medial Instep 37 cm Ankle Left: Right: Point of Measurement: 10 cm From Medial Instep 21.5 cm Knee To Floor Left: Right: From Medial Instep 39 cm Vascular Assessment Pulses: Dorsalis Pedis Palpable: [Right:Yes] Blood Pressure: Brachial: [Right:132] Ankle: [Right:Dorsalis Pedis: 120 0.91] Electronic Signature(Robbins) Signed: 07/10/2022 10:58:52 AM By: [name] RN Entered By: [name] on 07/10/2022 08:44:36 -------------------------------------------------------------------------------- Multi Wound Chart Details Patient Name: Date of Service: Dorothy Robbins, Dorothy Dodrill Robbins. 07/10/2022 8:00 A M Medical Record Number: [id number] Patient Account Number: [id number] Date of Birth/Sex: Treating RN: February 14, 1951  (72 y.o. F) Primary Care Lain Tetterton: [place] RRO Edwyna [place], [place] Other Clinician: Referring Merikay Lesniewski: Treating Legaci Tarman/Extender: Candis Musa in Treatment: 0 Vital Signs Height(in): 64 Pulse(bpm): 87 Weight(lbs): 185 Blood Pressure(mmHg): 132/80 Body Mass Index(BMI): 31.8 Temperature(F): 98.6 Respiratory Rate(breaths/min): 18 [1:Photos:] [N/A:N/A] Right, Anterior Lower Leg N/A N/A Wound Location: Surgical Injury N/A N/A Wounding Event: Atypical N/A N/A Primary Etiology: Hypertension, Type II Diabetes, N/A N/A Comorbid History: Osteoarthritis 06/14/2022 N/A N/A Date Acquired: 0 N/A N/A Weeks of Treatment: Open N/A N/A Wound Status: No N/A N/A Wound Recurrence: 2x2.5x0.1 N/A N/A Measurements L x W x D (cm) 3.927 N/A N/A A (cm) : rea 0.393 N/A N/A Volume (cm) : Full Thickness With Exposed Support N/A N/A Classification: Structures Dorothy Robbins, Dorothy Robbins ([id number]) [id number] Page 5 of 8 Medium N/A N/A Exudate Amount: Large (67-100%) N/A N/A Granulation A mount: Red, Hyper-granulation N/A  N/A Granulation Quality: Small (1-33%) N/A N/A Necrotic Amount: Fat Layer (Subcutaneous Tissue): Yes N/A N/A Exposed Structures: Debridement - Selective/Open Wound N/A N/A Debridement: 08:48 N/A N/A Pre-procedure Verification/Time Out Taken: Lidocaine 5% topical ointment N/A N/A Pain Control: Slough N/A N/A Tissue Debrided: Non-Viable Tissue N/A N/A Level: 5 N/A N/A Debridement A (sq cm): rea Curette N/A N/A Instrument: Minimum N/A N/A Bleeding: Pressure N/A N/A Hemostasis A chieved: 0 N/A N/A Procedural Pain: 0 N/A N/A Post Procedural Pain: Procedure was tolerated well N/A N/A Debridement Treatment Response: 2x2.5x0.1 N/A N/A Post Debridement Measurements L x W x D (cm) 0.393 N/A N/A Post Debridement Volume: (cm) Scarring: Yes N/A N/A Periwound Skin Texture: No Abnormalities Noted N/A N/A Periwound Skin  Moisture: No Abnormalities Noted N/A N/A Periwound Skin Color: No Abnormality N/A N/A Temperature: Debridement N/A N/A Procedures Performed: Treatment Notes Electronic Signature(Robbins) Signed: 07/10/2022 9:03:49 AM By: Fredirick Maudlin MD FACS Entered By: Fredirick Maudlin on 07/10/2022 09:03:49 -------------------------------------------------------------------------------- Multi-Disciplinary Care Plan Details Patient Name: Date of Service: Dorothy Robbins, Dorothy Dodrill Robbins. 07/10/2022 8:00 A M Medical Record Number: [id number] Patient Account Number: [id number] Date of Birth/Sex: Treating RN: 1951/05/16 (72 y.o. Dorothy Robbins Primary Care Damali Broadfoot: [place] RRO Edwyna [place], [place] Other Clinician: Referring Latonia Conrow: Treating Misael Mcgaha/Extender: Candis Musa in Treatment: 0 Active Inactive Wound/Skin Impairment Nursing Diagnoses: Knowledge deficit related to ulceration/compromised skin integrity Goals: Patient/caregiver will verbalize understanding of skin care regimen Date Initiated: 07/10/2022 Target Resolution Date: 09/18/2022 Goal Status: Active Interventions: Assess ulceration(Robbins) every visit Treatment Activities: Skin care regimen initiated : 07/10/2022 Notes: Electronic Signature(Robbins) Signed: 07/10/2022 10:58:52 AM By: [name] RN Entered By: [name] on 07/10/2022 10:49:43 Dorothy Robbins ([id number].pdf Page 6 of 8 -------------------------------------------------------------------------------- Pain Assessment Details Patient Name: Date of Service: Dorothy Robbins 07/10/2022 8:00 A M Medical Record Number: [id number] Patient Account Number: [id number] Date of Birth/Sex: Treating RN: [birth year] (72 y.o. Dorothy Robbins Primary Care Tonisha Silvey: [place] RRO Edwyna [place], [place] Other Clinician: Referring Janalee Grobe: Treating Baine Decesare/Extender: Candis Musa in Treatment: 0 Active Problems Location of Pain  Severity and Description of Pain Patient Has Paino No Site Locations Pain Management and Medication Current Pain Management: Electronic Signature(Robbins) Signed: 07/10/2022 10:58:52 AM By: [name] RN Entered By: [name] on 07/10/2022 08:51:22 -------------------------------------------------------------------------------- Patient/Caregiver Education Details Patient Name: Date of Service: Dorothy Robbins 2/20/2024andnbsp8:00 A M Medical Record Number: [id number] Patient Account Number: [id number] Date of Birth/Gender: Treating RN: 11-Aug-1950 (72 y.o. Dorothy Robbins Primary Care Physician: [place] RRO Edwyna [place], [place] Other Clinician: Referring Physician: Treating Physician/Extender: Candis Musa in Treatment: 0 Education Assessment Education Provided To: Patient Education Topics Provided Wound/Skin Impairment: Methods: Explain/Verbal Responses: Return demonstration correctly Dorothy Robbins, Dorothy Robbins ([id number]) [id number] Page 7 of 8 Electronic Signature(Robbins) Signed: 07/10/2022 10:58:52 AM By: [name] RN Entered By: [name] on 07/10/2022 10:49:56 -------------------------------------------------------------------------------- Wound Assessment Details Patient Name: Date of Service: Dorothy Robbins, [place] Robbins. 07/10/2022 8:00 A M Medical Record Number: [id number] Patient Account Number: [id number] Date of Birth/Sex: Treating RN: 10/28/1950 (72 y.o. Dorothy Robbins Primary Care Wahid Holley: [place] RRO Edwyna [place], [place] Other Clinician: Referring Jerimyah Vandunk: Treating Sandy Blouch/Extender: Candis Musa in Treatment: 0 Wound Status Wound Number: 1 Primary Etiology: Atypical Wound Location: Right, Anterior Lower Leg Wound Status: Open Wounding Event: Surgical Injury Notes: Squamous cell carcinoma. Had Mohs surgery 06/07/22 Date Acquired: 06/14/2022 Comorbid History: Hypertension, Type II Diabetes,  Osteoarthritis Weeks Of Treatment: 0 Clustered Wound: No  Photos Wound Measurements Length: (cm) 2 Width: (cm) 2.5 Depth: (cm) 0.1 Area: (cm) 3.927 Volume: (cm) 0.393 % Reduction in Area: % Reduction in Volume: Tunneling: No Undermining: No Wound Description Classification: Full Thickness With Exposed Suppo Exudate Amount: Medium rt Structures Foul Odor After Cleansing: No Slough/Fibrino Yes Wound Bed Granulation Amount: Large (67-100%) Exposed Structure Granulation Quality: Red, Hyper-granulation Fat Layer (Subcutaneous Tissue) Exposed: Yes Necrotic Amount: Small (1-33%) Necrotic Quality: Adherent Slough Periwound Skin Texture Texture Color No Abnormalities Noted: No No Abnormalities Noted: Yes Scarring: Yes Temperature / Pain Temperature: No Abnormality Moisture No Abnormalities Noted: Yes Treatment Notes Wound #1 (Lower Leg) Wound Laterality: Right, Anterior Dorothy Robbins, Dorothy Robbins ([id number]) [phone].pdf Page 8 of 8 Cleanser Soap and Water Discharge Instruction: May shower and wash wound with dial antibacterial soap and water prior to dressing change. Wound Cleanser Discharge Instruction: Cleanse the wound with wound cleanser prior to applying a clean dressing using gauze sponges, not tissue or cotton balls. Peri-Wound Care Sween Lotion (Moisturizing lotion) Discharge Instruction: Apply moisturizing lotion as directed Topical Primary Dressing Sorbalgon AG Dressing 2x2 (in/in) Discharge Instruction: Apply to wound bed as instructed Secondary Dressing ABD Pad, 8x10 Discharge Instruction: Apply over primary dressing as directed. Woven Gauze Sponge, Non-Sterile 4x4 in Discharge Instruction: Apply over primary dressing as directed. Secured With Transpore Surgical Tape, 2x10 (in/yd) Discharge Instruction: Secure dressing with tape as directed. Compression Wrap ThreePress (3 layer compression wrap) Discharge Instruction: Apply three layer  compression as directed. Tubular netting #5 Compression Stockings Add-Ons Electronic Signature(Robbins) Signed: 07/10/2022 10:58:52 AM By: [name] RN Entered By: [name] on 07/10/2022 08:50:57 -------------------------------------------------------------------------------- Vitals Details Patient Name: Date of Service: Dorothy Robbins, Dorothy NCY Robbins. 07/10/2022 8:00 A M Medical Record Number: [id number] Patient Account Number: [id number] Date of Birth/Sex: Treating RN: August 22, 1950 (72 y.o. Dorothy Robbins Primary Care Isom Kochan: [place] RRO Edwyna Shell, [place] Other Clinician: Referring Alverta Caccamo: Treating Karrine Kluttz/Extender: Candis Musa in Treatment: 0 Vital Signs Time Taken: 08:05 Temperature (F): 98.6 Height (in): 64 Pulse (bpm): 87 Weight (lbs): 185 Respiratory Rate (breaths/min): 18 Body Mass Index (BMI): 31.8 Blood Pressure (mmHg): 132/80 Reference Range: 80 - 120 mg / dl Electronic Signature(Robbins) Signed: 07/10/2022 10:58:52 AM By: [name] RN Entered By: [name] on 07/10/2022 08:16:40

## 2022-07-11 NOTE — Progress Notes (Signed)
KLOIE, MASRI ([id number]) [id number] Page 1 of 4 Visit Report for 07/10/2022 Abuse Risk Screen Details Patient Name: Date of Service: Dorothy Robbins 07/10/2022 8:00 A M Medical Record Number: [id number] Patient Account Number: [id number] Date of Birth/Sex: Treating RN: [birth year] (72 y.o. Dorothy Robbins Primary Care Dreydon Cardenas: [place] RRO Edwyna [place], [place] Other Clinician: Referring Ceceilia Cephus: Treating Rani Idler/Extender: Candis Musa in Treatment: 0 Abuse Risk Screen Items Answer ABUSE RISK SCREEN: Has anyone close to you tried to hurt or harm you recentlyo No Do you feel uncomfortable with anyone in your familyo No Has anyone forced you do things that you didnt want to doo No Electronic Signature(Robbins) Signed: 07/10/2022 10:58:52 AM By: [name] RN Entered By: [name] on 07/10/2022 08:31:02 -------------------------------------------------------------------------------- Activities of Daily Living Details Patient Name: Date of Service: Dorothy Robbins 07/10/2022 8:00 A M Medical Record Number: [id number] Patient Account Number: [id number] Date of Birth/Sex: Treating RN: [birth year] (72 y.o. Dorothy Robbins Primary Care Panayiota Larkin: [place] RRO Edwyna Shell, [place] Other Clinician: Referring Deuce Paternoster: Treating Natalye Kott/Extender: Candis Musa in Treatment: 0 Activities of Daily Living Items Answer Activities of Daily Living (Please select one for each item) Drive Automobile Completely Able T Medications ake Completely Able Use T elephone Completely Able Care for Appearance Completely Able Use T oilet Completely Able Bath / Shower Completely Able Dress Self Completely Able Feed Self Completely Able Walk Completely Able Get In / Out Bed Completely Able Housework Completely Able Prepare Meals Completely Able Handle Money Completely Able Shop for Self Completely Able Electronic  Signature(Robbins) Signed: 07/10/2022 10:58:52 AM By: [name] RN Entered By: [name] on 07/10/2022 08:31:27 Dorothy Robbins ([id number]) [id number] Page 2 of 4 -------------------------------------------------------------------------------- Education Screening Details Patient Name: Date of Service: Dorothy Robbins, [place] 07/10/2022 8:00 A M Medical Record Number: [id number] Patient Account Number: [id number] Date of Birth/Sex: Treating RN: Dorothy Robbins, Dorothy Robbins (72 y.o. Dorothy Robbins Primary Care Nyimah Shadduck: [place] RRO Edwyna [place], [place] Other Clinician: Referring Ezreal Turay: Treating Oliviarose Punch/Extender: Candis Musa in Treatment: 0 Primary Learner Assessed: Patient Learning Preferences/Education Level/Primary Language Learning Preference: Explanation, Demonstration, Printed Material Highest Education Level: College or Above Preferred Language: English Cognitive Barrier Language Barrier: No Translator Needed: No Memory Deficit: No Emotional Barrier: No Cultural/Religious Beliefs Affecting Medical Care: No Physical Barrier Impaired Vision: No Impaired Hearing: No Decreased Hand dexterity: No Knowledge/Comprehension Knowledge Level: High Comprehension Level: High Ability to understand written instructions: High Ability to understand verbal instructions: High Motivation Anxiety Level: Calm Cooperation: Cooperative Education Importance: Acknowledges Need Interest in Health Problems: Asks Questions Perception: Coherent Willingness to Engage in Self-Management High Activities: Readiness to Engage in Self-Management High Activities: Electronic Signature(Robbins) Signed: 07/10/2022 10:58:52 AM By: [name] RN Entered By: [name] on 07/10/2022 08:32:Robbins -------------------------------------------------------------------------------- Fall Risk Assessment Details Patient Name: Date of Service: Dorothy Robbins, NA Dorothy Robbins. 07/10/2022 8:00  A M Medical Record Number: [id number] Patient Account Number: [id number] Date of Birth/Sex: Treating RN: Dorothy Robbins (73 y.o. Dorothy Robbins Primary Care Thailan Sava: [place] RRO Edwyna [place], [place] Other Clinician: Referring Jeanne Diefendorf: Treating Sagan Wurzel/Extender: Candis Musa in Treatment: 0 Fall Risk Assessment Items Have you had 2 or more falls in the last 12 monthso 0 No Dorothy Robbins, Dorothy Robbins ([id number]) [id number] Page 3 of 4 Have you had any fall that resulted in injury in the last 12 monthso 0 No FALLS RISK SCREEN History of falling - immediate or within 3 months 0 No Secondary  diagnosis (Do you have 2 or more medical diagnoseso) 0 No Ambulatory aid None/bed rest/wheelchair/nurse 0 No Crutches/cane/walker 0 No Furniture 0 No Intravenous therapy Access/Saline/Heparin Lock 0 No Gait/Transferring Normal/ bed rest/ wheelchair 0 No Weak (short steps with or without shuffle, stooped but able to lift head while walking, may seek 0 No support from furniture) Impaired (short steps with shuffle, may have difficulty arising from chair, head down, impaired 0 No balance) Mental Status Oriented to own ability 0 No Electronic Signature(Robbins) Signed: 07/10/2022 10:58:52 AM By: [name] RN Entered By: [name] on 07/10/2022 08:32:14 -------------------------------------------------------------------------------- Foot Assessment Details Patient Name: Date of Service: Dorothy Robbins, Dorothy Dodrill Robbins. 07/10/2022 8:00 A M Medical Record Number: [id number] Patient Account Number: [id number] Date of Birth/Sex: Treating RN: Dorothy Robbins (72 y.o. Dorothy Robbins Primary Care Reniah Cottingham: [place] RRO Edwyna Shell, [place] Other Clinician: Referring Nahiara Kretzschmar: Treating Jervis Trapani/Extender: Candis Musa in Treatment: 0 Foot Assessment Items Site Locations + = Sensation present, - = Sensation absent, C = Callus, U = Ulcer R = Redness, W = Warmth, M =  Maceration, PU = Pre-ulcerative lesion F = Fissure, Robbins = Swelling, D = Dryness Assessment Right: Left: Other Deformity: No No Prior Foot Ulcer: No No Prior Amputation: No No Charcot Joint: No No Ambulatory Status: Ambulatory Without Help GaitMARIANGELA, Dorothy Robbins ([id number]) [phone] [id number] Page 4 of 4 Electronic Signature(Robbins) Signed: 07/10/2022 10:58:52 AM By: [name] RN Entered By: [name] on 07/10/2022 08:33:50 -------------------------------------------------------------------------------- Nutrition Risk Screening Details Patient Name: Date of Service: Dorothy Robbins 07/10/2022 8:00 A M Medical Record Number: [id number] Patient Account Number: [id number] Date of Birth/Sex: Treating RN: Dorothy Robbins (72 y.o. Dorothy Robbins Primary Care Joanthan Hlavacek: [place] RRO LL, [place] Other Clinician: Referring Uriyah Raska: Treating Jasani Dolney/Extender: Candis Musa in Treatment: 0 Height (in): 64 Weight (lbs): 185 Body Mass Index (BMI): 31.8 Nutrition Risk Screening Items Score Screening NUTRITION RISK SCREEN: I have an illness or condition that made me change the kind and/or amount of food I eat 0 No I eat fewer than two meals per day 0 No I eat few fruits and vegetables, or milk products 0 No I have three or more drinks of beer, liquor or wine almost every day 0 No I have tooth or mouth problems that make it hard for me to eat 0 No I don't always have enough money to buy the food I need 0 No I eat alone most of the time 0 No I take three or more different prescribed or over-the-counter drugs a day 0 No Without wanting to, I have lost or gained 10 pounds in the last six months 0 No I am not always physically able to shop, cook and/or feed myself 0 No Nutrition Protocols Good Risk Protocol 0 No interventions needed Moderate Risk Protocol High Risk Proctocol Risk Level: Good Risk Score: 0 Electronic  Signature(Robbins) Signed: 07/10/2022 10:58:52 AM By: [name] RN Entered By: [name] on 07/10/2022 08:32:21

## 2022-07-18 ENCOUNTER — Encounter (HOSPITAL_BASED_OUTPATIENT_CLINIC_OR_DEPARTMENT_OTHER): Payer: Medicare Other | Admitting: General Surgery

## 2022-07-18 DIAGNOSIS — S81801A Unspecified open wound, right lower leg, initial encounter: Secondary | ICD-10-CM | POA: Diagnosis not present

## 2022-07-18 DIAGNOSIS — L97812 Non-pressure chronic ulcer of other part of right lower leg with fat layer exposed: Secondary | ICD-10-CM | POA: Diagnosis not present

## 2022-07-18 DIAGNOSIS — Y838 Other surgical procedures as the cause of abnormal reaction of the patient, or of later complication, without mention of misadventure at the time of the procedure: Secondary | ICD-10-CM | POA: Diagnosis not present

## 2022-07-18 DIAGNOSIS — T8189XA Other complications of procedures, not elsewhere classified, initial encounter: Secondary | ICD-10-CM | POA: Diagnosis not present

## 2022-07-18 DIAGNOSIS — M48062 Spinal stenosis, lumbar region with neurogenic claudication: Secondary | ICD-10-CM | POA: Diagnosis not present

## 2022-07-18 DIAGNOSIS — E11622 Type 2 diabetes mellitus with other skin ulcer: Secondary | ICD-10-CM | POA: Diagnosis not present

## 2022-07-18 DIAGNOSIS — L4059 Other psoriatic arthropathy: Secondary | ICD-10-CM | POA: Diagnosis not present

## 2022-07-18 DIAGNOSIS — I10 Essential (primary) hypertension: Secondary | ICD-10-CM | POA: Diagnosis not present

## 2022-07-19 NOTE — Progress Notes (Signed)
ASHYLA, MULLINGS ([id number]) [id number] Page 1 of 7 Visit Report for 07/18/2022 Arrival Information Details Patient Name: Date of Service: Dorothy Robbins 07/18/2022 9:30 A M Medical Record Number: [id number] Patient Account Number: [id number] Date of Birth/Sex: Treating RN: [birth year] (72 y.o. F) Zochol, Jamie Primary Care Brigitt Mcclish: [place] RRO LL, [place] Other Clinician: Referring Uzoma Vivona: Treating Liliani Bobo/Extender: Fredirick Maudlin [place] RRO LL, [place] Weeks in Treatment: 1 Visit Information History Since Last Visit Added or deleted any medications: No Patient Arrived: Ambulatory Any new allergies or adverse reactions: No Arrival Time: 09:27 Had a fall or experienced change in No Accompanied By: self activities of daily living that may affect Transfer Assistance: None risk of falls: Signs or symptoms of abuse/neglect since last visito No Hospitalized since last visit: No Implantable device outside of the clinic excluding No cellular tissue based products placed in the center since last visit: Has Dressing in Place as Prescribed: Yes Pain Present Now: Yes Electronic Signature(s) Signed: 07/18/2022 4:01:42 PM By: Blanche East RN Entered By: Blanche East on 07/18/2022 09:27:55 -------------------------------------------------------------------------------- Compression Therapy Details Patient Name: Date of Service: Dorothy Mocha S. 07/18/2022 9:30 A M Medical Record Number: [id number] Patient Account Number: [id number] Date of Birth/Sex: Treating RN: Oct 13, 1950 (72 y.o. F) Zochol, Jamie Primary Care Aldred Mase: [place] RRO Edwyna Shell, [place] Other Clinician: Referring Evangelos Paulino: Treating Alida Greiner/Extender: Fredirick Maudlin [place] RRO LL, [place] Weeks in Treatment: 1 Compression Therapy Performed for Wound Assessment: Wound #1 Right,Anterior Lower Leg Performed By: Clinician Blanche East, RN Compression Type: Three Layer Post Procedure Diagnosis Same as  Pre-procedure Electronic Signature(s) Signed: 07/18/2022 4:01:42 PM By: Blanche East RN Entered By: Blanche East on 07/18/2022 09:38:51 -------------------------------------------------------------------------------- Encounter Discharge Information Details Patient Name: Date of Service: Dorothy Robbins, [place]. 07/18/2022 9:30 A M Medical Record Number: [id number] Patient Account Number: [id number] Date of Birth/Sex: Treating RN: 1950/12/07 (72 y.o. F) Zochol, Jamie Primary Care Kyvon Hu: [place] RRO Edwyna [place], [place] Other Clinician: Referring Allene Furuya: Treating Jaymie Misch/Extender: Fredirick Maudlin [place] RRO LL, [place] Weeks in Treatment: 1 Encounter Discharge Information Items Post Procedure Vitals Discharge Condition: Stable Temperature (F): 98.6 Ambulatory Status: Ambulatory Pulse (bpm): 91 Discharge Destination: Home Respiratory Rate (breaths/min): 18 Transportation: Private Auto Blood Pressure (mmHg): 151/86 Accompanied By: self [name] ([id number]) [id number] Page 2 of 7 Schedule Follow-up Appointment: Yes Clinical Summary of Care: Electronic Signature(s) Signed: 07/18/2022 4:01:42 PM By: Blanche East RN Entered By: Blanche East on 07/18/2022 09:39:51 -------------------------------------------------------------------------------- Lower Extremity Assessment Details Patient Name: Date of Service: Dorothy Robbins, Dorothy Robbins 07/18/2022 9:30 A M Medical Record Number: [id number] Patient Account Number: [id number] Date of Birth/Sex: Treating RN: August 18, 1950 (72 y.o. F) Zochol, Mur[place] Primary Care Adeleine Pask: [place] RRO Edwyna Shell, [place] Other Clinician: Referring Stanisha Lorenz: Treating Melisia Leming/Extender: Fredirick Maudlin [place] RRO LL, [place] Weeks in Treatment: 1 Edema Assessment Assessed: [Left: No] [Right: No] [Left: Edema] [Right: :] Calf Left: Right: Point of Measurement: 30 cm From Medial Instep 36 cm Ankle Left: Right: Point of Measurement: 10 cm From Medial Instep 21 cm Vascular  Assessment Pulses: Dorsalis Pedis Palpable: [Right:Yes] Electronic Signature(s) Signed: 07/18/2022 4:01:42 PM By: Blanche East RN Entered By: Blanche East on 07/18/2022 09:28:51 -------------------------------------------------------------------------------- Multi Wound Chart Details Patient Name: Date of Service: Dorothy Robbins, [place]. 07/18/2022 9:30 A M Medical Record Number: [id number] Patient Account Number: [id number] Date of Birth/Sex: Treating RN: Sep 21, 1950 (72 y.o. F) Primary Care Amaurie Schreckengost: [place] RRO LL, [place] Other Clinician: Referring Zahrah Sutherlin: Treating Thaine Garriga/Extender: Fredirick Maudlin [place] RRO LL, [place] Weeks in Treatment: 1  Vital Signs Height(in): 64 Capillary Blood Glucose(mg/dl): 131 Weight(lbs): 185 Pulse(bpm): 91 Body Mass Index(BMI): 31.8 Blood Pressure(mmHg): 151/86 Temperature(F): 98.6 Respiratory Rate(breaths/min): 18 [1:Photos: No Photos Right, Anterior Lower Leg Wound Location: Surgical Injury Wounding Event: Atypical Primary Etiology: Hypertension, Type II Diabetes, Comorbid History: Osteoarthritis 06/14/2022 Date Acquired: 1 Weeks of Treatment:] [N/A:N/A N/A N/A N/A N/A N/A  N/A] Dorothy Robbins, Dorothy Robbins ([id number]) [1:Open Wound Status: No Wound Recurrence: 1.4x1x0.1 Measurements L x W x D (cm) 1.1 A (cm) : rea 0.11 Volume (cm) : 72.00% % Reduction in Area: 72.00% % Reduction in Volume: Full Thickness With Exposed Support N/A Classification: Structures Medium  Exudate A mount: Large (67-100%) Granulation A mount: Red, Hyper-granulation Granulation Quality: Small (1-33%) Necrotic A mount: Fat Layer (Subcutaneous Tissue): Yes N/A Exposed Structures: Small (1-33%) Epithelialization: Debridement - Selective/Open  Wound N/A Debridement: Pre-procedure Verification/Time Out 09:36 Taken: Lidocaine 5% topical ointment Pain Control: Slough Tissue Debrided: Non-Viable Tissue Level: 1.4 Debridement A (sq cm): rea Curette Instrument: Minimum Bleeding: Pressure Hemostasis  A  chieved: 0 Procedural Pain: 0 Post Procedural Pain: Procedure was tolerated well Debridement Treatment Response: 1.4x1x0.1 Post Debridement Measurements L x W x D (cm) 0.11 Post Debridement Volume: (cm) Scarring: Yes Periwound Skin Texture: No  Abnormalities Noted Periwound Skin Moisture: No Abnormalities Noted Periwound Skin Color: No Abnormality Temperature: Compression Therapy Procedures Performed: Debridement] [N/A:N/A N/A N/A N/A N/A N/A N/A N/A N/A N/A N/A N/A N/A N/A N/A N/A N/A N/A N/A  N/A N/A N/A N/A N/A N/A N/A N/A N/A N/A N/A] Treatment Notes Wound #1 (Lower Leg) Wound Laterality: Right, Anterior Cleanser Soap and Water Discharge Instruction: May shower and wash wound with dial antibacterial soap and water prior to dressing change. Wound Cleanser Discharge Instruction: Cleanse the wound with wound cleanser prior to applying a clean dressing using gauze sponges, not tissue or cotton balls. Peri-Wound Care Sween Lotion (Moisturizing lotion) Discharge Instruction: Apply moisturizing lotion as directed Topical Primary Dressing Sorbalgon AG Dressing 2x2 (in/in) Discharge Instruction: Apply to wound bed as instructed Secondary Dressing ABD Pad, 8x10 Discharge Instruction: Apply over primary dressing as directed. Woven Gauze Sponge, Non-Sterile 4x4 in Discharge Instruction: Apply over primary dressing as directed. Secured With Transpore Surgical Tape, 2x10 (in/yd) Discharge Instruction: Secure dressing with tape as directed. Compression Wrap ThreePress (3 layer compression wrap) Discharge Instruction: Apply three layer compression as directed. Tubular netting #5 Compression Stockings Add-Ons Dorothy Robbins, Dorothy Robbins ([id number]) [phone].pdf Page 4 of 7 Electronic Signature(s) Signed: 07/18/2022 9:43:56 AM By: Fredirick Maudlin MD FACS Entered By: Fredirick Maudlin on 07/18/2022  09:43:56 -------------------------------------------------------------------------------- Multi-Disciplinary Care Plan Details Patient Name: Date of Service: Dorothy Robbins, [place] NCY S. 07/18/2022 9:30 A M Medical Record Number: [id number] Patient Account Number: [id number] Date of Birth/Sex: Treating RN: [birth year] (72 y.o. F) Zochol, Jamie Primary Care Anniebelle Devore: [place] RRO Edwyna Shell, [place] Other Clinician: Referring Naria Abbey: Treating Sharmel Ballantine/Extender: Fredirick Maudlin [place] RRO LL, [place] Weeks in Treatment: 1 Active Inactive Wound/Skin Impairment Nursing Diagnoses: Knowledge deficit related to ulceration/compromised skin integrity Goals: Patient/caregiver will verbalize understanding of skin care regimen Date Initiated: 07/10/2022 Target Resolution Date: 09/18/2022 Goal Status: Active Interventions: Assess ulceration(s) every visit Treatment Activities: Skin care regimen initiated : 07/10/2022 Notes: Electronic Signature(s) Signed: 07/18/2022 4:01:42 PM By: Blanche East RN Entered By: Blanche East on 07/18/2022 09:33:26 -------------------------------------------------------------------------------- Pain Assessment Details Patient Name: Date of Service: Dorothy Mocha S. 07/18/2022 9:30 A M Medical Record Number: [id number] Patient Account Number: [id number] Date of Birth/Sex: Treating RN: Dec 18, 1950 (72 y.o. F) Zochol, Jamie Primary  Care Adianna Darwin: [place] RRO LL, [place] Other Clinician: Referring Maximos Zayas: Treating Sharnelle Cappelli/Extender: Fredirick Maudlin [place] RRO LL, [place] Weeks in Treatment: 1 Active Problems Location of Pain Severity and Description of Pain Patient Has Paino Yes Site Locations Rate the pain. Dorothy Robbins, Dorothy Robbins ([id number]) [id number] Page 5 of 7 Rate the pain. Current Pain Level: 2 Character of Pain Describe the Pain: Tender Pain Management and Medication Current Pain Management: Electronic Signature(s) Signed: 07/18/2022 4:01:42 PM By: Blanche East  RN Entered By: Blanche East on 07/18/2022 09:28:22 -------------------------------------------------------------------------------- Patient/Caregiver Education Details Patient Name: Date of Service: Dorothy Robbins 2/28/2024andnbsp9:30 [place] Record Number: [id number] Patient Account Number: [id number] Date of Birth/Gender: Treating RN: Mar 13, 1951 (72 y.o. F) Zochol, [place] Primary Care Physician: [place] RRO Edwyna Shell, [place] Other Clinician: Referring Physician: Treating Physician/Extender: Fredirick Maudlin [place] RRO LL, [place] Weeks in Treatment: 1 Education Assessment Education Provided To: Patient Education Topics Provided Wound Debridement: Methods: Explain/Verbal Responses: Reinforcements needed, State content correctly Wound/Skin Impairment: Methods: Explain/Verbal Responses: Reinforcements needed, State content correctly Electronic Signature(s) Signed: 07/18/2022 4:01:42 PM By: Blanche East RN Entered By: Blanche East on 07/18/2022 09:33:44 -------------------------------------------------------------------------------- Wound Assessment Details Patient Name: Date of Service: Dorothy Mocha S. 07/18/2022 9:30 A M Medical Record Number: [id number] Patient Account Number: [id number] Date of Birth/Sex: Treating RN: 20-Dec-1950 (72 y.o. Marta Lamas Primary Care Duncan Alejandro: [place] RRO Edwyna Shell, [place] Other Clinician: Referring Millee Denise: Treating Kanita Delage/Extender: Fredirick Maudlin [place] RRO LL, [place] Weeks in Treatment: [place], [place] ([id number]) [id number] Page 6 of 7 Wound Status Wound Number: 1 Primary Etiology: Atypical Wound Location: Right, Anterior Lower Leg Wound Status: Open Wounding Event: Surgical Injury Notes: Squamous cell carcinoma. Had Mohs surgery 06/07/22 Date Acquired: 06/14/2022 Comorbid History: Hypertension, Type II Diabetes, Osteoarthritis Weeks Of Treatment: 1 Clustered Wound: No Wound Measurements Length: (cm) Width: (cm) Depth:  (cm) Area: (cm) Volume: (cm) 1.4 % Reduction in Area: 72% 1 % Reduction in Volume: 72% 0.1 Epithelialization: Small (1-33%) 1.1 Tunneling: No 0.11 Undermining: No Wound Description Classification: Full Thickness With Exposed Support Structures Exudate Amount: Medium Foul Odor After Cleansing: No Slough/Fibrino Yes Wound Bed Granulation Amount: Large (67-100%) Exposed Structure Granulation Quality: Red, Hyper-granulation Fat Layer (Subcutaneous Tissue) Exposed: Yes Necrotic Amount: Small (1-33%) Necrotic Quality: Adherent Slough Periwound Skin Texture Texture Color No Abnormalities Noted: No No Abnormalities Noted: Yes Scarring: Yes Temperature / Pain Temperature: No Abnormality Moisture No Abnormalities Noted: Yes Treatment Notes Wound #1 (Lower Leg) Wound Laterality: Right, Anterior Cleanser Soap and Water Discharge Instruction: May shower and wash wound with dial antibacterial soap and water prior to dressing change. Wound Cleanser Discharge Instruction: Cleanse the wound with wound cleanser prior to applying a clean dressing using gauze sponges, not tissue or cotton balls. Peri-Wound Care Sween Lotion (Moisturizing lotion) Discharge Instruction: Apply moisturizing lotion as directed Topical Primary Dressing Sorbalgon AG Dressing 2x2 (in/in) Discharge Instruction: Apply to wound bed as instructed Secondary Dressing ABD Pad, 8x10 Discharge Instruction: Apply over primary dressing as directed. Woven Gauze Sponge, Non-Sterile 4x4 in Discharge Instruction: Apply over primary dressing as directed. Secured With Transpore Surgical Tape, 2x10 (in/yd) Discharge Instruction: Secure dressing with tape as directed. Compression Wrap ThreePress (3 layer compression wrap) Discharge Instruction: Apply three layer compression as directed. Tubular netting #5 Compression Stockings Add-Ons Dorothy Robbins, Dorothy Robbins ([id number]) [phone].pdf Page 7 of  7 Electronic Signature(s) Signed: 07/18/2022 4:01:42 PM By: Blanche East RN Entered By: Blanche East on 07/18/2022 09:31:34 -------------------------------------------------------------------------------- Vitals Details Patient Name: Date of Service: Dorothy Robbins, Dorothy  NCY S. 07/18/2022 9:30 A M Medical Record Number: [id number] Patient Account Number: [id number] Date of Birth/Sex: Treating RN: Aug 17, 1950 (72 y.o. F) Zochol, [place] Primary Care Shylyn Younce: [place] RRO LL, [place] Other Clinician: Referring Joliyah Lippens: Treating Lavone Weisel/Extender: Fredirick Maudlin [place] RRO LL, [place] Weeks in Treatment: 1 Vital Signs Time Taken: 09:27 Temperature (F): 98.6 Height (in): 64 Pulse (bpm): 91 Weight (lbs): 185 Respiratory Rate (breaths/min): 18 Body Mass Index (BMI): 31.8 Blood Pressure (mmHg): 151/86 Capillary Blood Glucose (mg/dl): 131 Reference Range: 80 - 120 mg / dl Electronic Signature(s) Signed: 07/18/2022 4:01:42 PM By: Blanche East RN Entered By: Blanche East on 07/18/2022 09:28:13

## 2022-07-19 NOTE — Progress Notes (Signed)
IKESHIA, GUSSLER ([id number]) [id number] Page 1 of 8 Visit Report for 07/18/2022 Chief Complaint Document Details Patient Name: Date of Service: Dorothy Robbins 07/18/2022 9:30 A M Medical Record Number: [id number] Patient Account Number: [id number] Date of Birth/Sex: Treating RN: 03-May-1951 (72 y.o. F) Primary Care Provider: CA RRO LL, ERIN Other Clinician: Referring Provider: Treating Provider/Extender: Fredirick Maudlin CA RRO LL, ERIN Weeks in Treatment: 1 Information Obtained from: Patient Chief Complaint Patient presents to the wound care center with open non-healing surgical wound(s) Electronic Signature(s) Signed: 07/18/2022 9:44:47 AM By: Fredirick Maudlin MD FACS Entered By: Fredirick Maudlin on 07/18/2022 09:44:47 -------------------------------------------------------------------------------- Debridement Details Patient Name: Date of Service: Evert Kohl, [place]. 07/18/2022 9:30 A M Medical Record Number: [id number] Patient Account Number: [id number] Date of Birth/Sex: Treating RN: Mar 04, 1951 (72 y.o. F) Zochol, [place] Primary Care Provider: CA RRO LL, ERIN Other Clinician: Referring Provider: Treating Provider/Extender: Fredirick Maudlin CA RRO LL, ERIN Weeks in Treatment: 1 Debridement Performed for Assessment: Wound #1 Right,Anterior Lower Leg Performed By: Physician Fredirick Maudlin, MD Debridement Type: Debridement Level of Consciousness (Pre-procedure): Awake and Alert Pre-procedure Verification/Time Out Yes - 09:36 Taken: Start Time: 09:37 Pain Control: Lidocaine 5% topical ointment T Area Debrided (L x W): otal 1.4 (cm) x 1 (cm) = 1.4 (cm) Tissue and other material debrided: Non-Viable, Slough, Slough Level: Non-Viable Tissue Debridement Description: Selective/Open Wound Instrument: Curette Bleeding: Minimum Hemostasis Achieved: Pressure Procedural Pain: 0 Post Procedural Pain: 0 Response to Treatment: Procedure was tolerated  well Level of Consciousness (Post- Awake and Alert procedure): Post Debridement Measurements of Total Wound Length: (cm) 1.4 Width: (cm) 1 Depth: (cm) 0.1 Volume: (cm) 0.11 Character of Wound/Ulcer Post Debridement: Requires Further Debridement Post Procedure Diagnosis Same as Pre-procedure Notes Scribed for Dr. Celine Ahr by Blanche East, RN Electronic Signature(s) Signed: 07/18/2022 11:09:45 AM By: Fredirick Maudlin MD FACS Signed: 07/18/2022 4:01:42 PM By: Blanche East RN Entered By: Blanche East on 07/18/2022 09:38:35 [name] ([id number]) [id number] Page 2 of 8 -------------------------------------------------------------------------------- HPI Details Patient Name: Date of Service: Dorothy Robbins 07/18/2022 9:30 A M Medical Record Number: [id number] Patient Account Number: [id number] Date of Birth/Sex: Treating RN: 1950-06-17 (72 y.o. F) Primary Care Provider: CA RRO Edwyna Shell, [place] Other Clinician: Referring Provider: Treating Provider/Extender: Fredirick Maudlin CA RRO LL, ERIN Weeks in Treatment: 1 History of Present Illness HPI Description: ADMISSION 07/10/2022 This is a 72 year old woman with type 2 diabetes, well-controlled on oral agents and psoriatic arthritis. She underwent a Mohs procedure on January 18 for squamous cell carcinoma on her leg. The wound became infected with Staph aureus. She was treated with topical mupirocin and doxycycline. She is no longer using either of these. She is due to start [facility] for her psoriatic arthritis, but her rheumatologist wants her wound to heal before initiating this treatment. She has been applying Vaseline and a Band-Aid, per her dermatologist recommendation. On her right anterior tibial surface, there is a circular wound with perimeter healing already present. There is a layer of rubbery slough. There is no erythema, induration, purulent drainage, or other signs of infection. 07/18/2022: Her wound  is smaller by a centimeter. There has been some accumulation of slough. No concern for infection. Electronic Signature(s) Signed: 07/18/2022 9:45:18 AM By: Fredirick Maudlin MD FACS Entered By: Fredirick Maudlin on 07/18/2022 09:45:18 -------------------------------------------------------------------------------- Physical Exam Details Patient Name: Date of Service: Evert Kohl, [name] 07/18/2022 9:30 A M Medical Record Number: [id number] Patient Account Number: [id number] Date of Birth/Sex: Treating RN:  Mar 21, 1951 (72 y.o. F) Primary Care Provider: CA RRO LL, ERIN Other Clinician: Referring Provider: Treating Provider/Extender: Fredirick Maudlin CA RRO LL, ERIN Weeks in Treatment: 1 Constitutional Hypertensive, asymptomatic. . . . no acute distress. Respiratory Normal work of breathing on room air. Notes 07/18/2022: Her wound is smaller by a centimeter. There has been some accumulation of slough. No concern for infection. Electronic Signature(s) Signed: 07/18/2022 9:45:47 AM By: Fredirick Maudlin MD FACS Entered By: Fredirick Maudlin on 07/18/2022 09:45:47 -------------------------------------------------------------------------------- Physician Orders Details Patient Name: Date of Service: Evert Kohl, [place]. 07/18/2022 9:30 A M Medical Record Number: [id number] Patient Account Number: [id number] Date of Birth/Sex: Treating RN: February 23, 1951 (72 y.o. 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 - Dr. Celine Ahr Room 3 Return A Anesthetic (In clinic) Topical Lidocaine 5% applied to wound bed - Used in Clinic Bathing/ Shower/ Hygiene May shower with protection but do not get wound dressing(s) wet. Protect dressing(s) with water repellant cover (for example, large plastic bag) or a cast cover and may then take shower. - +++ Do not get Right lower leg wet+++ Use a cast protector or take sponge baths. Cast Protectors can bre purchased from [employer], [employer] supply store. Costs range between $14-$27 Edema Control - Lymphedema / SCD / Other Avoid standing for long periods of time. Other Edema Control Orders/Instructions: - Wear compression stocking on left leg. Then when right leg is healed use compression stocking on right leg. Additional Orders / Instructions Follow Nutritious Diet - Please try and increase your daily protein intake to about 60-80gramms per day Wound Treatment Wound #1 - Lower Leg Wound Laterality: Right, Anterior Cleanser: Soap and Water 1 x Per Week/30 Days Discharge Instructions: May shower and wash wound with dial antibacterial soap and water prior to dressing change. Cleanser: Wound Cleanser 1 x Per Week/30 Days Discharge Instructions: Cleanse the wound with wound cleanser prior to applying a clean dressing using gauze sponges, not tissue or cotton balls. Peri-Wound Care: Sween Lotion (Moisturizing lotion) 1 x Per Week/30 Days Discharge Instructions: Apply moisturizing lotion as directed Prim Dressing: Sorbalgon AG Dressing 2x2 (in/in) 1 x Per Week/30 Days ary Discharge Instructions: Apply to wound bed as instructed Secondary Dressing: ABD Pad, 8x10 1 x Per Week/30 Days Discharge Instructions: Apply over primary dressing as directed. Secondary Dressing: Woven Gauze Sponge, Non-Sterile 4x4 in 1 x Per Week/30 Days Discharge Instructions:  Apply over primary dressing as directed. Secured With: Transpore Surgical Tape, 2x10 (in/yd) 1 x Per Week/30 Days Discharge Instructions: Secure dressing with tape as directed. Compression Wrap: ThreePress (3 layer compression wrap) 1 x Per Week/30 Days Discharge Instructions: Apply three layer compression as directed. Compression Wrap: Tubular netting #5 1 x Per Week/30 Days Electronic Signature(s) Signed: 07/18/2022 11:09:45 AM By: Fredirick Maudlin MD FACS Entered By: Fredirick Maudlin on 07/18/2022 09:46:03 -------------------------------------------------------------------------------- Problem List Details Patient Name: Date of Service: Evert Kohl, NA NCY S. 07/18/2022 9:30 A M Medical Record Number: [id number] Patient Account Number: [id number] Date of Birth/Sex: Treating RN: 22-Sep-1950 (72 y.o.  F) Primary Care Provider: CA RRO Edwyna Shell, [place] Other Clinician: Referring Provider: Treating Provider/Extender: Fredirick Maudlin CA RRO LL, ERIN Weeks in Treatment: 1 Active Problems ICD-10 DIASHA, LOBASSO ([id number]) [id number] Page 4 of 8 Encounter Code Description Active Date MDM Diagnosis L97.812 Non-pressure chronic ulcer of other part of right lower leg with fat layer 07/10/2022 No Yes exposed E11.9 Type 2 diabetes mellitus without complications [id number] No Yes I10 Essential (primary) hypertension 07/10/2022 No Yes M48.062 Spinal stenosis, lumbar region with neurogenic claudication 07/10/2022 No Yes L40.59 Other psoriatic arthropathy 07/10/2022 No Yes Inactive Problems Resolved Problems Electronic Signature(s) Signed: 07/18/2022 9:43:49 AM By: Fredirick Maudlin MD FACS Entered By: Fredirick Maudlin on 07/18/2022 09:43:49 -------------------------------------------------------------------------------- Progress Note Details Patient Name: Date of Service: Evert Kohl, [name] 07/18/2022 9:30 A M Medical Record Number: [id number] Patient Account Number:  [id number] Date of Birth/Sex: Treating RN: Sep 19, 1950 (72 y.o. F) Primary Care Provider: CA RRO LL, ERIN Other Clinician: Referring Provider: Treating Provider/Extender: Fredirick Maudlin CA RRO LL, ERIN Weeks in Treatment: 1 Subjective Chief Complaint Information obtained from Patient Patient presents to the wound care center with open non-healing surgical wound(s) History of Present Illness (HPI) ADMISSION 07/10/2022 This is a 72 year old woman with type 2 diabetes, well-controlled on oral agents and psoriatic arthritis. She underwent a Mohs procedure on January 18 for squamous cell carcinoma on her leg. The wound became infected with Staph aureus. She was treated with topical mupirocin and doxycycline. She is no longer using either of these. She is due to start [place] [facility] for her psoriatic arthritis, but her rheumatologist wants her wound to heal before initiating this treatment. She has been applying Vaseline and a Band-Aid, per her dermatologist recommendation. On her right anterior tibial surface, there is a circular wound with perimeter healing already present. There is a layer of rubbery slough. There is no erythema, induration, purulent drainage, or other signs of infection. 07/18/2022: Her wound is smaller by a centimeter. There has been some accumulation of slough. No concern for infection. Patient History Information obtained from Patient. Family History Unknown History. Social History Never smoker, Marital Status - Married, Alcohol Use - Rarely, Drug Use - No History, Caffeine Use - Daily - coffee. Medical History Cardiovascular Patient has history of Hypertension Endocrine Patient has history of Type II Diabetes Musculoskeletal Patient has history of Osteoarthritis - Hips,feet, back and knees AYN, CARRITHERS ([id number]) [id number] Page 5 of 8 Hospitalization/Surgery History - Mohs Surgery 06/07/22;back surgery;lip surgery. Medical A Surgical  History Notes nd Musculoskeletal Hx: Psoriatic arthritis Oncologic R Lower Leg- Squamous cell carcinoma Objective Constitutional Hypertensive, asymptomatic. no acute distress. Vitals Time Taken: 9:27 AM, Height: 64 in, Weight: 185 lbs, BMI: 31.8, Temperature: 98.6 F, Pulse: 91 bpm, Respiratory Rate: 18 breaths/min, Blood Pressure: 151/86 mmHg, Capillary Blood Glucose: 131 mg/dl. Respiratory Normal work of breathing on room air. General Notes: 07/18/2022: Her wound is smaller by a centimeter. There has been some accumulation of slough. No concern for infection. Integumentary (Hair, Skin) Wound #1 status is Open. Original cause of wound was Surgical Injury. The date acquired was: 06/14/2022. The wound has been in treatment 1 weeks. The wound is located on the Right,Anterior Lower Leg. The wound measures 1.4cm length x 1cm width x 0.1cm depth; 1.1cm^2 area and 0.11cm^3 volume. There is Fat Layer (Subcutaneous Tissue) exposed. There is no tunneling or undermining noted. There is a medium amount of drainage noted. There is large (67-100%) red, hyper - granulation within the wound bed. There is a small (  1-33%) amount of necrotic tissue within the wound bed including Adherent Slough. The periwound skin appearance had no abnormalities noted for moisture. The periwound skin appearance had no abnormalities noted for color. The periwound skin appearance exhibited: Scarring. Periwound temperature was noted as No Abnormality. Assessment Active Problems ICD-10 Non-pressure chronic ulcer of other part of right lower leg with fat layer exposed Type 2 diabetes mellitus without complications Essential (primary) hypertension Spinal stenosis, lumbar region with neurogenic claudication Other psoriatic arthropathy Procedures Wound #1 Pre-procedure diagnosis of Wound #1 is an Atypical located on the Right,Anterior Lower Leg . There was a Selective/Open Wound Non-Viable Tissue Debridement with a total area  of 1.4 sq cm performed by Fredirick Maudlin, MD. With the following instrument(s): Curette to remove Non-Viable tissue/material. Material removed includes [facility] after achieving pain control using Lidocaine 5% topical ointment. No specimens were taken. A time out was conducted at 09:36, prior to the start of the procedure. A Minimum amount of bleeding was controlled with Pressure. The procedure was tolerated well with a pain level of 0 throughout and a pain level of 0 following the procedure. Post Debridement Measurements: 1.4cm length x 1cm width x 0.1cm depth; 0.11cm^3 volume. Character of Wound/Ulcer Post Debridement requires further debridement. Post procedure Diagnosis Wound #1: Same as Pre-Procedure General Notes: Scribed for Dr. Celine Ahr by Blanche East, RN. Pre-procedure diagnosis of Wound #1 is an Atypical located on the Right,Anterior Lower Leg . There was a Three Layer Compression Therapy Procedure by Blanche East, RN. Post procedure Diagnosis Wound #1: Same as Pre-Procedure Plan Follow-up Appointments: Return Appointment in 1 week. - Dr. Celine Ahr Room 3 OLIVIANA, MEREDITH ([id number]) [id number] Page 6 of 8 Anesthetic: (In clinic) Topical Lidocaine 5% applied to wound bed - Used in Clinic Bathing/ Shower/ Hygiene: May shower with protection but do not get wound dressing(s) wet. Protect dressing(s) with water repellant cover (for example, large plastic bag) or a cast cover and may then take shower. - +++ Do not get Right lower leg wet+++ Use a cast protector or take sponge baths. Cast Protectors can bre purchased from [employer], [employer] supply store. Costs range between $14-$27 Edema Control - Lymphedema / SCD / Other: Avoid standing for long periods of time. Other Edema Control Orders/Instructions: - Wear compression stocking on left leg. Then when right leg is healed use compression stocking on right leg. Additional Orders / Instructions: Follow  Nutritious Diet - Please try and increase your daily protein intake to about 60-80gramms per day WOUND #1: - Lower Leg Wound Laterality: Right, Anterior Cleanser: Soap and Water 1 x Per Week/30 Days Discharge Instructions: May shower and wash wound with dial antibacterial soap and water prior to dressing change. Cleanser: Wound Cleanser 1 x Per Week/30 Days Discharge Instructions: Cleanse the wound with wound cleanser prior to applying a clean dressing using gauze sponges, not tissue or cotton balls. Peri-Wound Care: Sween Lotion (Moisturizing lotion) 1 x Per Week/30 Days Discharge Instructions: Apply moisturizing lotion as directed Prim Dressing: Sorbalgon AG Dressing 2x2 (in/in) 1 x Per Week/30 Days ary Discharge Instructions: Apply to wound bed as instructed Secondary Dressing: ABD Pad, 8x10 1 x Per Week/30 Days Discharge Instructions: Apply over primary dressing as directed. Secondary Dressing: Woven Gauze Sponge, Non-Sterile 4x4 in 1 x Per Week/30 Days Discharge Instructions: Apply over primary dressing as directed. Secured With: Transpore Surgical T ape, 2x10 (in/yd) 1 x Per Week/30 Days Discharge Instructions: Secure dressing with tape as directed. Com pression Wrap: ThreePress (3 layer compression wrap) 1  x Per Week/30 Days Discharge Instructions: Apply three layer compression as directed. Com pression Wrap: Tubular netting #5 1 x Per Week/30 Days 07/18/2022: Her wound is smaller by a centimeter. There has been some accumulation of slough. No concern for infection. I used a curette to debride the slough from her wound. We will continue silver alginate and 3 layer compression. Follow-up in 1 week. Electronic Signature(s) Signed: 07/18/2022 9:46:26 AM By: Fredirick Maudlin MD FACS Entered By: Fredirick Maudlin on 07/18/2022 09:46:26 -------------------------------------------------------------------------------- HxROS Details Patient Name: Date of Service: Evert Kohl, NA NCY S. 07/18/2022  9:30 A M Medical Record Number: [id number] Patient Account Number: [id number] Date of Birth/Sex: Treating RN: 1951/02/13 (72 y.o. F) Primary Care Provider: CA RRO LL, ERIN Other Clinician: Referring Provider: Treating Provider/Extender: Fredirick Maudlin CA RRO LL, ERIN Weeks in Treatment: 1 Information Obtained From Patient Cardiovascular Medical History: Positive for: Hypertension Endocrine Medical History: Positive for: Type II Diabetes Time with diabetes: 10 years Treated with: Oral agents Blood sugar tested every day: Yes Tested : Every day Musculoskeletal Medical History: Positive for: Osteoarthritis - Hips,feet, back and knees Past Medical History Notes: Hx: Psoriatic arthritis Oncologic Medical History: Past Medical History NotesYANEL, MCGRUE ([id number]) [id number] Page 7 of 8 R Lower Leg- Squamous cell carcinoma Immunizations Pneumococcal Vaccine: Received Pneumococcal Vaccination: Yes Received Pneumococcal Vaccination On or After 60th Birthday: Yes Implantable Devices None Hospitalization / Surgery History Type of Hospitalization/Surgery Mohs Surgery 06/07/22;back surgery;lip surgery Family and Social History Unknown History: Yes; Never smoker; Marital Status - Married; Alcohol Use: Rarely; Drug Use: No History; Caffeine Use: Daily - coffee; Financial Concerns: No; Food, Clothing or Shelter Needs: No; Support System Lacking: No; Transportation Concerns: No Electronic Signature(s) Signed: 07/18/2022 11:09:45 AM By: Fredirick Maudlin MD FACS Entered By: Fredirick Maudlin on 07/18/2022 09:45:24 -------------------------------------------------------------------------------- SuperBill Details Patient Name: Date of Service: Evert Kohl, Robet Leu 07/18/2022 Medical Record Number: [id number] Patient Account Number: [id number] Date of Birth/Sex: Treating RN: 05/31/1950 (72 y.o. F) Primary Care Provider: CA RRO LL, ERIN Other  Clinician: Referring Provider: Treating Provider/Extender: Fredirick Maudlin CA RRO LL, ERIN Weeks in Treatment: 1 Diagnosis Coding ICD-10 Codes Code Description [phone] Non-pressure chronic ulcer of other part of right lower leg with fat layer exposed E11.9 Type 2 diabetes mellitus without complications [id number] Essential (primary) hypertension M48.062 Spinal stenosis, lumbar region with neurogenic claudication L40.59 Other psoriatic arthropathy Facility Procedures : CPT4 Code: [id number] Description: [id number] - DEBRIDE WOUND 1ST 20 SQ CM OR < ICD-10 Diagnosis Description L97.812 Non-pressure chronic ulcer of other part of right lower leg with fat layer expos Modifier: ed Quantity: 1 Physician Procedures : CPT4 Code Description Modifier [id number] - WC PHYS LEVEL 3 - EST PT 25 ICD-10 Diagnosis Description L97.812 Non-pressure chronic ulcer of other part of right lower leg with fat layer exposed E11.9 Type 2 diabetes mellitus without complications  [id number] Other psoriatic arthropathy M48.062 Spinal stenosis, lumbar region with neurogenic claudication Quantity: 1 : [id number] - WC PHYS DEBR WO ANESTH 20 SQ CM ICD-10 Diagnosis Description [id number] Non-pressure chronic ulcer of other part of right lower leg with fat layer exposed Quantity: 1 Electronic Signature(s) Signed: 07/18/2022 9:47:07 AM By: Fredirick Maudlin MD FACS [name] ([id number]) [id number] Page 8 of 8 Entered By: Fredirick Maudlin on 07/18/2022 09:47:07

## 2022-07-25 DIAGNOSIS — Z85828 Personal history of other malignant neoplasm of skin: Secondary | ICD-10-CM | POA: Diagnosis not present

## 2022-07-25 DIAGNOSIS — Z5189 Encounter for other specified aftercare: Secondary | ICD-10-CM | POA: Diagnosis not present

## 2022-07-26 ENCOUNTER — Encounter (HOSPITAL_BASED_OUTPATIENT_CLINIC_OR_DEPARTMENT_OTHER): Payer: Medicare Other | Attending: General Surgery | Admitting: General Surgery

## 2022-07-26 DIAGNOSIS — M48062 Spinal stenosis, lumbar region with neurogenic claudication: Secondary | ICD-10-CM | POA: Insufficient documentation

## 2022-07-26 DIAGNOSIS — M1990 Unspecified osteoarthritis, unspecified site: Secondary | ICD-10-CM | POA: Insufficient documentation

## 2022-07-26 DIAGNOSIS — E1151 Type 2 diabetes mellitus with diabetic peripheral angiopathy without gangrene: Secondary | ICD-10-CM | POA: Insufficient documentation

## 2022-07-26 DIAGNOSIS — E11622 Type 2 diabetes mellitus with other skin ulcer: Secondary | ICD-10-CM | POA: Diagnosis not present

## 2022-07-26 DIAGNOSIS — I890 Lymphedema, not elsewhere classified: Secondary | ICD-10-CM | POA: Diagnosis not present

## 2022-07-26 DIAGNOSIS — S81801A Unspecified open wound, right lower leg, initial encounter: Secondary | ICD-10-CM | POA: Diagnosis not present

## 2022-07-26 DIAGNOSIS — L4059 Other psoriatic arthropathy: Secondary | ICD-10-CM | POA: Insufficient documentation

## 2022-07-26 DIAGNOSIS — I10 Essential (primary) hypertension: Secondary | ICD-10-CM | POA: Diagnosis not present

## 2022-07-26 DIAGNOSIS — L97812 Non-pressure chronic ulcer of other part of right lower leg with fat layer exposed: Secondary | ICD-10-CM | POA: Diagnosis not present

## 2022-07-27 NOTE — Progress Notes (Addendum)
LAYSA, MAHIEU ([id number]) [id number] Page 1 of 7 Visit Report for 07/26/2022 Chief Complaint Document Details Patient Name: Date of Service: Dorothy Robbins 07/26/2022 8:30 A M Medical Record Number: [id number] Patient Account Number: [id number] Date of Birth/Sex: Treating Dorothy Robbins: [birth year] (72 y.o. F) Primary Care Provider: [place] RRO Robbins, Dorothy Robbins Other Clinician: Referring Provider: Treating Provider/Extender: Dorothy Robbins Dorothy Robbins, Dorothy Robbins Weeks in Treatment: 2 Information Obtained from: Patient Chief Complaint Patient presents to the wound care center with open non-healing surgical wound(s) Electronic Signature(s) Signed: 07/26/2022 8:58:35 AM By: Dorothy Maudlin MD FACS Entered By: Dorothy Robbins on 07/26/2022 08:58:35 -------------------------------------------------------------------------------- HPI Details Patient Name: Date of Service: Dorothy Robbins, [place]. 07/26/2022 8:30 A M Medical Record Number: [id number] Patient Account Number: [id number] Date of Birth/Sex: Treating Dorothy Robbins: 04/08/1951 (72 y.o. F) Primary Care Provider: [place] RRO Edwyna Shell, [place] Other Clinician: Referring Provider: Treating Provider/Extender: Dorothy Robbins Dorothy Robbins, Dorothy Robbins Weeks in Treatment: 2 History of Present Illness HPI Description: ADMISSION 07/10/2022 This is a 72 year old woman with type 2 diabetes, well-controlled on oral agents and psoriatic arthritis. She underwent a Mohs procedure on January 18 for squamous cell carcinoma on her leg. The wound became infected with Staph aureus. She was treated with topical mupirocin and doxycycline. She is no longer using either of these. She is due to start [place] for her psoriatic arthritis, but her rheumatologist wants her wound to heal before initiating this treatment. She has been applying Vaseline and a Band-Aid, per her dermatologist recommendation. On her right anterior tibial surface, there is a circular wound with perimeter healing  already present. There is a layer of rubbery slough. There is no erythema, induration, purulent drainage, or other signs of infection. 07/18/2022: Her wound is smaller by a centimeter. There has been some accumulation of slough. No concern for infection. 07/26/2022: Her wound continues to contract. It is extremely clean. Electronic Signature(s) Signed: 07/26/2022 8:59:06 AM By: Dorothy Maudlin MD FACS Entered By: Dorothy Robbins on 07/26/2022 08:59:05 -------------------------------------------------------------------------------- Physical Exam Details Patient Name: Date of Service: Dorothy Robbins, Dorothy NCY S. 07/26/2022 8:30 A M Medical Record Number: [id number] Patient Account Number: [id number] Date of Birth/Sex: Treating Dorothy Robbins: Dorothy Robbins (72 y.o. F) Primary Care Provider: [place] RRO Robbins, Dorothy Robbins Other Clinician: Referring Provider: Treating Provider/Extender: Dorothy Robbins Dorothy Robbins, Dorothy Robbins Weeks in Treatment: 2 Constitutional . . . . no acute distress. Respiratory Normal work of breathing on room air. [name], [name] ([id number]) [id number] Page 2 of 7 Notes 07/26/2022: Her wound continues to contract. It is extremely clean. Electronic Signature(s) Signed: 07/26/2022 8:59:38 AM By: Dorothy Maudlin MD FACS Entered By: Dorothy Robbins on 07/26/2022 08:59:37 -------------------------------------------------------------------------------- Physician Orders Details Patient Name: Date of Service: Dorothy Robbins, Dorothy NCY S. 07/26/2022 8:30 A M Medical Record Number: [id number] Patient Account Number: [id number] Date of Birth/Sex: Treating Dorothy Robbins: Dorothy Robbins (72 y.o. F) Dorothy Robbins, Dorothy Robbins Primary Care Provider: [place] RRO Robbins, [place] Other Clinician: Referring Provider: Treating Provider/Extender: Dorothy Robbins Dorothy Robbins, Dorothy Robbins Weeks in Treatment: 2 Verbal / Phone Orders: No Diagnosis Coding ICD-10 Coding Code Description [phone] Non-pressure chronic ulcer of other part of right lower leg with fat  layer exposed E11.9 Type 2 diabetes mellitus without complications [id number] Essential (primary) hypertension M48.062 Spinal stenosis, lumbar region with neurogenic claudication L40.59 Other psoriatic arthropathy Follow-up Appointments ppointment in 1 week. - Dr. Celine Ahr Room 2 Return A Thursday 08/09/22 at 9:15am Nurse Visit: - Rm 1 Friday 08/03/22 at 11:15am Anesthetic (In clinic) Topical Lidocaine 5% applied to wound  bed - Used in Clinic Bathing/ Shower/ Hygiene May shower with protection but do not get wound dressing(s) wet. Protect dressing(s) with water repellant cover (for example, large plastic bag) or a cast cover and may then take shower. - +++ Do not get Right lower leg wet+++ Use a cast protector or take sponge baths. Cast Protectors can bre purchased from [employer], [employer] supply store. Costs range between $14-$27 Edema Control - Lymphedema / SCD / Other Avoid standing for long periods of time. Other Edema Control Orders/Instructions: - Wear compression stocking on left leg. Then when right leg is healed use compression stocking on right leg. Additional Orders / Instructions Follow Nutritious Diet - Please try and increase your daily protein intake to about 60-80gramms per day Wound Treatment Wound #1 - Lower Leg Wound Laterality: Right, Anterior Cleanser: Soap and Water 1 x Per Week/30 Days Discharge Instructions: May shower and wash wound with dial antibacterial soap and water prior to dressing change. Cleanser: Wound Cleanser 1 x Per Week/30 Days Discharge Instructions: Cleanse the wound with wound cleanser prior to applying a clean dressing using gauze sponges, not tissue or cotton balls. Peri-Wound Care: Sween Lotion (Moisturizing lotion) 1 x Per Week/30 Days Discharge Instructions: Apply moisturizing lotion as directed Prim Dressing: Sorbalgon AG Dressing 2x2 (in/in) 1 x Per Week/30 Days ary Discharge Instructions: Apply to wound bed as instructed Secondary  Dressing: ABD Pad, 8x10 1 x Per Week/30 Days Discharge Instructions: Apply over primary dressing as directed. Secondary Dressing: Woven Gauze Sponge, Non-Sterile 4x4 in 1 x Per Week/30 Days Discharge Instructions: Apply over primary dressing as directed. Secured With: Transpore Surgical Tape, 2x10 (in/yd) 1 x Per Week/30 Days Discharge Instructions: Secure dressing with tape as directed. CHELCY, LILLA ([id number]) [id number] Page 3 of 7 Compression Wrap: ThreePress (3 layer compression wrap) 1 x Per Week/30 Days Discharge Instructions: Apply three layer compression as directed. Compression Wrap: Tubular netting #5 1 x Per Week/30 Days Electronic Signature(s) Signed: 07/26/2022 11:19:21 AM By: Dorothy Maudlin MD FACS Entered By: Dorothy Robbins on 07/26/2022 09:00:33 -------------------------------------------------------------------------------- Problem List Details Patient Name: Date of Service: Dorothy Robbins, Dorothy NCY S. 07/26/2022 8:30 A M Medical Record Number: [id number] Patient Account Number: [id number] Date of Birth/Sex: Treating Dorothy Robbins: 1950/09/03 (72 y.o. F) Primary Care Provider: [place] RRO Edwyna Shell, [place] Other Clinician: Referring Provider: Treating Provider/Extender: Dorothy Robbins Dorothy Robbins, Dorothy Robbins Weeks in Treatment: 2 Active Problems ICD-10 Encounter Code Description Active Date MDM Diagnosis L97.812 Non-pressure chronic ulcer of other part of right lower leg with fat layer 07/10/2022 No Yes exposed E11.9 Type 2 diabetes mellitus without complications [id number] No Yes I10 Essential (primary) hypertension 07/10/2022 No Yes M48.062 Spinal stenosis, lumbar region with neurogenic claudication 07/10/2022 No Yes L40.59 Other psoriatic arthropathy 07/10/2022 No Yes Inactive Problems Resolved Problems Electronic Signature(s) Signed: 07/26/2022 8:58:20 AM By: Dorothy Maudlin MD FACS Entered By: Dorothy Robbins on 07/26/2022  08:58:20 -------------------------------------------------------------------------------- Progress Note Details Patient Name: Date of Service: Dorothy Robbins, Dorothy NCY S. 07/26/2022 8:30 A M Medical Record Number: [id number] Patient Account Number: [id number] Date of Birth/Sex: Treating Dorothy Robbins: Dorothy Robbins (72 y.o. F) Primary Care Provider: [place] RRO Edwyna Shell, [place] Other Clinician: Referring Provider: Treating Provider/Extender: Dorothy Robbins Dorothy Robbins, Dorothy Robbins Weeks in Treatment: 2 Subjective Chief Complaint JAKIERA, SOLORZANO ([id number]) [id number] Page 4 of 7 Information obtained from Patient Patient presents to the wound care center with open non-healing surgical wound(s) History of Present Illness (HPI) ADMISSION 07/10/2022 This is a 72 year old woman with type 2 diabetes, well-controlled on  oral agents and psoriatic arthritis. She underwent a Mohs procedure on January 18 for squamous cell carcinoma on her leg. The wound became infected with Staph aureus. She was treated with topical mupirocin and doxycycline. She is no longer using either of these. She is due to start [place] for her psoriatic arthritis, but her rheumatologist wants her wound to heal before initiating this treatment. She has been applying Vaseline and a Band-Aid, per her dermatologist recommendation. On her right anterior tibial surface, there is a circular wound with perimeter healing already present. There is a layer of rubbery slough. There is no erythema, induration, purulent drainage, or other signs of infection. 07/18/2022: Her wound is smaller by a centimeter. There has been some accumulation of slough. No concern for infection. 07/26/2022: Her wound continues to contract. It is extremely clean. Patient History Information obtained from Patient. Family History Unknown History. Social History Never smoker, Marital Status - Married, Alcohol Use - Rarely, Drug Use - No History, Caffeine Use - Daily -  coffee. Medical History Cardiovascular Patient has history of Hypertension Endocrine Patient has history of Type II Diabetes Musculoskeletal Patient has history of Osteoarthritis - Hips,feet, back and knees Hospitalization/Surgery History - Mohs Surgery 06/07/22;back surgery;lip surgery. Medical A Surgical History Notes nd Musculoskeletal Hx: Psoriatic arthritis Oncologic R Lower Leg- Squamous cell carcinoma Objective Constitutional no acute distress. Vitals Time Taken: 8:14 AM, Height: 64 in, Weight: 185 lbs, BMI: 31.8, Temperature: 97.9 F, Pulse: 80 bpm, Respiratory Rate: 20 breaths/min, Blood Pressure: 133/78 mmHg, Capillary Blood Glucose: 131 mg/dl. Respiratory Normal work of breathing on room air. General Notes: 07/26/2022: Her wound continues to contract. It is extremely clean. Integumentary (Hair, Skin) Wound #1 status is Open. Original cause of wound was Surgical Injury. The date acquired was: 06/14/2022. The wound has been in treatment 2 weeks. The wound is located on the Right,Anterior Lower Leg. The wound measures 1.2cm length x 1.3cm width x 0.1cm depth; 1.225cm^2 area and 0.123cm^3 volume. There is Fat Layer (Subcutaneous Tissue) exposed. There is no tunneling or undermining noted. There is a medium amount of drainage noted. There is large (67-100%) red, hyper - granulation within the wound bed. There is a small (1-33%) amount of necrotic tissue within the wound bed including Adherent Slough. The periwound skin appearance had no abnormalities noted for moisture. The periwound skin appearance had no abnormalities noted for color. The periwound skin appearance exhibited: Scarring. Periwound temperature was noted as No Abnormality. Assessment Active Problems ICD-10 Non-pressure chronic ulcer of other part of right lower leg with fat layer exposed Type 2 diabetes mellitus without complications Essential (primary) hypertension Spinal stenosis, lumbar region with neurogenic  claudication Dorothy Robbins, Dorothy Robbins ([id number]) [id number] Page 5 of 7 Other psoriatic arthropathy Procedures Wound #1 Pre-procedure diagnosis of Wound #1 is an Atypical located on the Right,Anterior Lower Leg . There was a Three Layer Compression Therapy Procedure by Dorothy East, Dorothy Robbins. Post procedure Diagnosis Wound #1: Same as Pre-Procedure Plan Follow-up Appointments: Return Appointment in 1 week. - Dr. Celine Ahr Room 2 Thursday 08/09/22 at 9:15am Nurse Visit: - Rm 1 Friday 08/03/22 at 11:15am Anesthetic: (In clinic) Topical Lidocaine 5% applied to wound bed - Used in Clinic Bathing/ Shower/ Hygiene: May shower with protection but do not get wound dressing(s) wet. Protect dressing(s) with water repellant cover (for example, large plastic bag) or a cast cover and may then take shower. - +++ Do not get Right lower leg wet+++ Use a cast protector or take sponge baths. Cast Protectors can bre  purchased from [employer], [place] supply store. Costs range between $14-$27 Edema Control - Lymphedema / SCD / Other: Avoid standing for long periods of time. Other Edema Control Orders/Instructions: - Wear compression stocking on left leg. Then when right leg is healed use compression stocking on right leg. Additional Orders / Instructions: Follow Nutritious Diet - Please try and increase your daily protein intake to about 60-80gramms per day WOUND #1: - Lower Leg Wound Laterality: Right, Anterior Cleanser: Soap and Water 1 x Per Week/30 Days Discharge Instructions: May shower and wash wound with dial antibacterial soap and water prior to dressing change. Cleanser: Wound Cleanser 1 x Per Week/30 Days Discharge Instructions: Cleanse the wound with wound cleanser prior to applying a clean dressing using gauze sponges, not tissue or cotton balls. Peri-Wound Care: Sween Lotion (Moisturizing lotion) 1 x Per Week/30 Days Discharge Instructions: Apply moisturizing lotion as  directed Prim Dressing: Sorbalgon AG Dressing 2x2 (in/in) 1 x Per Week/30 Days ary Discharge Instructions: Apply to wound bed as instructed Secondary Dressing: ABD Pad, 8x10 1 x Per Week/30 Days Discharge Instructions: Apply over primary dressing as directed. Secondary Dressing: Woven Gauze Sponge, Non-Sterile 4x4 in 1 x Per Week/30 Days Discharge Instructions: Apply over primary dressing as directed. Secured With: Transpore Surgical T ape, 2x10 (in/yd) 1 x Per Week/30 Days Discharge Instructions: Secure dressing with tape as directed. Com pression Wrap: ThreePress (3 layer compression wrap) 1 x Per Week/30 Days Discharge Instructions: Apply three layer compression as directed. Com pression Wrap: Tubular netting #5 1 x Per Week/30 Days 07/26/2022: Her wound continues to contract. It is extremely clean. No debridement was necessary today. We will continue silver alginate and 3 layer compression. As I will be away from clinic next week, she will just have a nurse visit to have her dressing changed and follow-up with me in 2 weeks. Electronic Signature(s) Signed: 07/26/2022 9:01:53 AM By: Dorothy Maudlin MD FACS Entered By: Dorothy Robbins on 07/26/2022 09:01:53 -------------------------------------------------------------------------------- HxROS Details Patient Name: Date of Service: Dorothy Robbins, Dorothy NCY S. 07/26/2022 8:30 A M Medical Record Number: Dorothy Robbins Patient Account Number: [id number] Date of Birth/Sex: Treating Dorothy Robbins: 1950-05-27 (72 y.o. F) Primary Care Provider: [place] RRO Edwyna Shell, [place] Other Clinician: Referring Provider: Treating Provider/Extender: Dorothy Robbins Dorothy Robbins, Dorothy Robbins Weeks in Treatment: 2 Information Obtained From Patient Dorothy Robbins, Dorothy Robbins (Dorothy Robbins) [id number] Page 6 of 7 Cardiovascular Medical History: Positive for: Hypertension Endocrine Medical History: Positive for: Type II Diabetes Time with diabetes: 10 years Treated with: Oral  agents Blood sugar tested every day: Yes Tested : Every day Musculoskeletal Medical History: Positive for: Osteoarthritis - Hips,feet, back and knees Past Medical History Notes: Hx: Psoriatic arthritis Oncologic Medical History: Past Medical History Notes: R Lower Leg- Squamous cell carcinoma Immunizations Pneumococcal Vaccine: Received Pneumococcal Vaccination: Yes Received Pneumococcal Vaccination On or After 60th Birthday: Yes Implantable Devices None Hospitalization / Surgery History Type of Hospitalization/Surgery Mohs Surgery 06/07/22;back surgery;lip surgery Family and Social History Unknown History: Yes; Never smoker; Marital Status - Married; Alcohol Use: Rarely; Drug Use: No History; Caffeine Use: Daily - coffee; Financial Concerns: No; Food, Clothing or Shelter Needs: No; Support System Lacking: No; Transportation Concerns: No Electronic Signature(s) Signed: 07/26/2022 11:19:21 AM By: Dorothy Maudlin MD FACS Entered By: Dorothy Robbins on 07/26/2022 08:59:12 -------------------------------------------------------------------------------- SuperBill Details Patient Name: Date of Service: Dorothy Robbins, Dorothy NCY S. 07/26/2022 Medical Record Number: Dorothy Robbins Patient Account Number: [id number] Date of Birth/Sex: Treating Dorothy Robbins: 08/19/1950 (72 y.o. F) Primary Care Provider: [place] RRO Robbins, Dorothy Robbins Other  Clinician: Referring Provider: Treating Provider/Extender: Dorothy Robbins Dorothy Robbins, Dorothy Robbins Weeks in Treatment: 2 Diagnosis Coding ICD-10 Codes Code Description [phone] Non-pressure chronic ulcer of other part of right lower leg with fat layer exposed E11.9 Type 2 diabetes mellitus without complications [id number] Essential (primary) hypertension M48.062 Spinal stenosis, lumbar region with neurogenic claudication L40.59 Other psoriatic arthropathy Facility Procedures : CARLEIGH, BERNARDS Code: [id number] NIALAH TORGESON ([id number] ICD-1 L E Description: (Facility Use Only) [phone] - APPLY MULTLAY COMPRS  LWR RT LEG 3) [phone] Diagnosis Description 97.812 Non-pressure chronic ulcer of other part of right lower leg with fat layer exposed 11.9 Type 2 diabetes mellitus without  complications Modifier: 0000000 Quantity: 1 [id number].pdf Page 7 of 7 Physician Procedures : CPT4 Code Description Modifier [id number] - WC PHYS LEVEL 3 - EST PT ICD-10 Diagnosis Description [id number] Non-pressure chronic ulcer of other part of right lower leg with fat layer exposed E11.9 Type 2 diabetes mellitus without complications [id number]  Other psoriatic arthropathy I10 Essential (primary) hypertension Quantity: 1 Electronic Signature(s) Signed: 08/13/2022 11:02:40 AM By: Lonell Face Signed: 08/13/2022 5:13:01 PM By: Dorothy Maudlin MD FACS Previous Signature: 08/13/2022 11:00:33 AM Version By: Lonell Face Previous Signature: 07/26/2022 9:02:39 AM Version By: Dorothy Maudlin MD FACS Entered By: Lonell Face on 08/13/2022 11:02:40

## 2022-07-28 NOTE — Progress Notes (Signed)
Dorothy, Robbins ([id number]) [id number] Page 1 of 7 Visit Report for 07/26/2022 Arrival Information Details Patient Name: Date of Service: Dorothy Robbins, [place] 07/26/2022 8:30 A M Medical Record Number: [id number] Patient Account Number: [id number] Date of Birth/Sex: Treating RN: 12-14-1950 (72 y.o. F) Primary Care Danira Nylander: CA RRO LL, ERIN Other Clinician: Referring Kymora Sciara: Treating Damonte Frieson/Extender: Fredirick Maudlin [place] RRO LL, ERIN Weeks in Treatment: 2 Visit Information History Since Last Visit All ordered tests and consults were completed: No Patient Arrived: Ambulatory Added or deleted any medications: No Arrival Time: 08:14 Any new allergies or adverse reactions: No Accompanied By: husband Had a fall or experienced change in No Transfer Assistance: None activities of daily living that may affect Patient Identification Verified: Yes risk of falls: Secondary Verification Process Completed: Yes Signs or symptoms of abuse/neglect since last visito No Hospitalized since last visit: No Implantable device outside of the clinic excluding No cellular tissue based products placed in the center since last visit: Pain Present Now: No Electronic Signature(s) Signed: 07/26/2022 10:06:07 AM By: Worthy Rancher Entered By: Worthy Rancher on 07/26/2022 08:14:50 -------------------------------------------------------------------------------- Compression Therapy Details Patient Name: Date of Service: Dorothy Mocha S. 07/26/2022 8:30 A M Medical Record Number: [id number] Patient Account Number: [id number] Date of Birth/Sex: Treating RN: Jun 07, 1950 (72 y.o. F) Zochol, Jamie Primary Care Melburn Treiber: CA RRO Edwyna Shell, [place] Other Clinician: Referring Taliya Mcclard: Treating Eduarda Scrivens/Extender: Fredirick Maudlin CA RRO LL, ERIN Weeks in Treatment: 2 Compression Therapy Performed for Wound Assessment: Wound #1 Right,Anterior Lower Leg Performed By: Clinician Blanche East, RN Compression  Type: Three Layer Post Procedure Diagnosis Same as Pre-procedure Electronic Signature(s) Signed: 07/26/2022 5:01:03 PM By: Blanche East RN Entered By: Blanche East on 07/26/2022 08:40:26 -------------------------------------------------------------------------------- Encounter Discharge Information Details Patient Name: Date of Service: Dorothy Robbins, NA NCY S. 07/26/2022 8:30 A M Medical Record Number: [id number] Patient Account Number: [id number] Date of Birth/Sex: Treating RN: [birth year] (72 y.o. F) Zochol, Jamie Primary Care Logyn Dedominicis: CA RRO Edwyna [place], [place] Other Clinician: Referring Jacquette Canales: Treating Aneta Hendershott/Extender: Fredirick Maudlin CA RRO LL, ERIN Weeks in Treatment: 2 Encounter Discharge Information Items Discharge Condition: Stable Ambulatory Status: Ambulatory Discharge Destination: Home Transportation: Private Auto Accompanied By: self Dorothy Robbins ([id number]) [id number] Page 2 of 7 Schedule Follow-up Appointment: Yes Clinical Summary of Care: Electronic Signature(s) Signed: 07/26/2022 5:01:03 PM By: Blanche East RN Entered By: Blanche East on 07/26/2022 08:41:04 -------------------------------------------------------------------------------- Lower Extremity Assessment Details Patient Name: Date of Service: Dorothy Mocha S. 07/26/2022 8:30 A M Medical Record Number: [id number] Patient Account Number: [id number] Date of Birth/Sex: Treating RN: February 11, 1951 (72 y.o. F) Zochol, [place] Primary Care Rhayne Chatwin: [place] RRO Edwyna Shell, [place] Other Clinician: Referring Janalee Grobe: Treating Tanekia Ryans/Extender: Fredirick Maudlin CA RRO LL, ERIN Weeks in Treatment: 2 Edema Assessment Assessed: [Left: No] [Right: No] [Left: Edema] [Right: :] Calf Left: Right: Point of Measurement: 30 cm From Medial Instep 37 cm Ankle Left: Right: Point of Measurement: 10 cm From Medial Instep 21.4 cm Vascular Assessment Pulses: Dorsalis Pedis Palpable: [Right:Yes] Electronic  Signature(s) Signed: 07/26/2022 5:01:03 PM By: Blanche East RN Entered By: Blanche East on 07/26/2022 08:34:46 -------------------------------------------------------------------------------- Multi Wound Chart Details Patient Name: Date of Service: Dorothy Robbins, Dorothy Dodrill S. 07/26/2022 8:30 A M Medical Record Number: [id number] Patient Account Number: [id number] Date of Birth/Sex: Treating RN: 02-15-1951 (72 y.o. F) Primary Care Franklyn Cafaro: CA RRO LL, ERIN Other Clinician: Referring Nyra Anspaugh: Treating Nike Southers/Extender: Fredirick Maudlin CA RRO LL, ERIN Weeks in Treatment: 2 Vital Signs Height(in): 64 Capillary Blood Glucose(mg/dl): 131 Weight(lbs): 185  Pulse(bpm): 80 Body Mass Index(BMI): 31.8 Blood Pressure(mmHg): 133/78 Temperature(F): 97.9 Respiratory Rate(breaths/min): 20 [1:Photos:] [N/A:N/A] Right, Anterior Lower Leg N/A N/A Wound Location: Surgical Injury N/A N/A Wounding Event: Atypical N/A N/A Primary Etiology: Hypertension, Type II Diabetes, N/A N/A Comorbid History: Osteoarthritis 06/14/2022 N/A N/A Date Acquired: 2 N/A N/A Weeks of Treatment: Open N/A N/A Wound Status: No N/A N/A Wound Recurrence: 1.2x1.3x0.1 N/A N/A Measurements L x W x D (cm) 1.225 N/A N/A A (cm) : rea 0.123 N/A N/A Volume (cm) : 68.80% N/A N/A % Reduction in Area: 68.70% N/A N/A % Reduction in Volume: Full Thickness With Exposed Support N/A N/A Classification: Structures Medium N/A N/A Exudate A mount: Large (67-100%) N/A N/A Granulation A mount: Red, Hyper-granulation N/A N/A Granulation Quality: Small (1-33%) N/A N/A Necrotic A mount: Fat Layer (Subcutaneous Tissue): Yes N/A N/A Exposed Structures: Small (1-33%) N/A N/A Epithelialization: Scarring: Yes N/A N/A Periwound Skin Texture: No Abnormalities Noted N/A N/A Periwound Skin Moisture: No Abnormalities Noted N/A N/A Periwound Skin Color: No Abnormality N/A N/A Temperature: Compression Therapy N/A N/A Procedures  Performed: Treatment Notes Wound #1 (Lower Leg) Wound Laterality: Right, Anterior Cleanser Soap and Water Discharge Instruction: May shower and wash wound with dial antibacterial soap and water prior to dressing change. Wound Cleanser Discharge Instruction: Cleanse the wound with wound cleanser prior to applying a clean dressing using gauze sponges, not tissue or cotton balls. Peri-Wound Care Sween Lotion (Moisturizing lotion) Discharge Instruction: Apply moisturizing lotion as directed Topical Primary Dressing Sorbalgon AG Dressing 2x2 (in/in) Discharge Instruction: Apply to wound bed as instructed Secondary Dressing ABD Pad, 8x10 Discharge Instruction: Apply over primary dressing as directed. Woven Gauze Sponge, Non-Sterile 4x4 in Discharge Instruction: Apply over primary dressing as directed. Secured With Transpore Surgical Tape, 2x10 (in/yd) Discharge Instruction: Secure dressing with tape as directed. Compression Wrap ThreePress (3 layer compression wrap) Discharge Instruction: Apply three layer compression as directed. Tubular netting #5 Compression Stockings Add-Ons Electronic Signature(s) Signed: 07/26/2022 8:58:27 AM By: Fredirick Maudlin MD FACS Entered By: Fredirick Maudlin on 07/26/2022 08:58:27 Dorothy Robbins ([id number]) [id number] Page 4 of 7 -------------------------------------------------------------------------------- Multi-Disciplinary Care Plan Details Patient Name: Date of Service: Genevieve Norlander 07/26/2022 8:30 A M Medical Record Number: [id number] Patient Account Number: [id number] Date of Birth/Sex: Treating RN: [birth year] (72 y.o. F) Zochol, Jamie Primary Care Denea Cheaney: CA RRO Edwyna [place], [place] Other Clinician: Referring Muneeb Veras: Treating Jeramine Delis/Extender: Fredirick Maudlin CA RRO LL, ERIN Weeks in Treatment: 2 Active Inactive Wound/Skin Impairment Nursing Diagnoses: Knowledge deficit related to ulceration/compromised skin  integrity Goals: Patient/caregiver will verbalize understanding of skin care regimen Date Initiated: 07/10/2022 Target Resolution Date: 09/18/2022 Goal Status: Active Interventions: Assess ulceration(s) every visit Treatment Activities: Skin care regimen initiated : 07/10/2022 Notes: Electronic Signature(s) Signed: 07/26/2022 5:01:03 PM By: Blanche East RN Entered By: Blanche East on 07/26/2022 08:35:33 -------------------------------------------------------------------------------- Pain Assessment Details Patient Name: Date of Service: Dorothy Robbins, Dorothy Dodrill S. 07/26/2022 8:30 A M Medical Record Number: [id number] Patient Account Number: [id number] Date of Birth/Sex: Treating RN: Oct 17, 1950 (72 y.o. F) Primary Care Yurem Viner: CA RRO LL, ERIN Other Clinician: Referring Saryiah Bencosme: Treating Divante Kotch/Extender: Fredirick Maudlin CA RRO LL, ERIN Weeks in Treatment: 2 Active Problems Location of Pain Severity and Description of Pain Patient Has Paino No Site Locations Pain Management and Medication Current Pain Management: MECHELL, SOUTHARD ([id number]) [phone].pdf Page 5 of 7 Electronic Signature(s) Signed: 07/26/2022 10:06:07 AM By: Worthy Rancher Entered By: Worthy Rancher on 07/26/2022 08:15:21 -------------------------------------------------------------------------------- Patient/Caregiver Education Details Patient Name: Date of Service:  STRA DER, NA NCY S. 3/7/2024andnbsp8:30 A M Medical Record Number: [id number] Patient Account Number: [id number] Date of Birth/Gender: Treating RN: 07-18-1950 (72 y.o. F) Zochol, [place] Primary Care Physician: CA RRO Edwyna Shell, [place] Other Clinician: Referring Physician: Treating Physician/Extender: Fredirick Maudlin CA RRO LL, ERIN Weeks in Treatment: 2 Education Assessment Education Provided To: Patient Education Topics Provided Wound Debridement: Methods: Explain/Verbal Responses: Reinforcements needed, State content correctly Wound/Skin  Impairment: Methods: Explain/Verbal Responses: Reinforcements needed, State content correctly Electronic Signature(s) Signed: 07/26/2022 5:01:03 PM By: Blanche East RN Entered By: Blanche East on 07/26/2022 08:35:46 -------------------------------------------------------------------------------- Wound Assessment Details Patient Name: Date of Service: Dorothy Robbins, Dorothy Dodrill S. 07/26/2022 8:30 A M Medical Record Number: [id number] Patient Account Number: [id number] Date of Birth/Sex: Treating RN: 03/27/1951 (72 y.o. F) Primary Care Cherri Yera: CA RRO LL, ERIN Other Clinician: Referring Celestial Barnfield: Treating Scottie Stanish/Extender: Fredirick Maudlin CA RRO LL, ERIN Weeks in Treatment: 2 Wound Status Wound Number: 1 Primary Etiology: Atypical Wound Location: Right, Anterior Lower Leg Wound Status: Open Wounding Event: Surgical Injury Notes: Squamous cell carcinoma. Had Mohs surgery 06/07/22 Date Acquired: 06/14/2022 Comorbid History: Hypertension, Type II Diabetes, Osteoarthritis Weeks Of Treatment: 2 Clustered Wound: No Photos Wound Measurements Length: (cm) 1. KARCYN, LOSANO ([id number]) Width: (cm) Depth: (cm) Area: (cm) Volume: (cm) 2 % Reduction in Area: 68.8% [phone].pdf Page 6 of 7 1.3 % Reduction in Volume: 68.7% 0.1 Epithelialization: Small (1-33%) 1.225 Tunneling: No 0.123 Undermining: No Wound Description Classification: Full Thickness With Exposed Support Structures Exudate Amount: Medium Foul Odor After Cleansing: No Slough/Fibrino Yes Wound Bed Granulation Amount: Large (67-100%) Exposed Structure Granulation Quality: Red, Hyper-granulation Fat Layer (Subcutaneous Tissue) Exposed: Yes Necrotic Amount: Small (1-33%) Necrotic Quality: Adherent Slough Periwound Skin Texture Texture Color No Abnormalities Noted: No No Abnormalities Noted: Yes Scarring: Yes Temperature / Pain Temperature: No Abnormality Moisture No Abnormalities Noted:  Yes Treatment Notes Wound #1 (Lower Leg) Wound Laterality: Right, Anterior Cleanser Soap and Water Discharge Instruction: May shower and wash wound with dial antibacterial soap and water prior to dressing change. Wound Cleanser Discharge Instruction: Cleanse the wound with wound cleanser prior to applying a clean dressing using gauze sponges, not tissue or cotton balls. Peri-Wound Care Sween Lotion (Moisturizing lotion) Discharge Instruction: Apply moisturizing lotion as directed Topical Primary Dressing Sorbalgon AG Dressing 2x2 (in/in) Discharge Instruction: Apply to wound bed as instructed Secondary Dressing ABD Pad, 8x10 Discharge Instruction: Apply over primary dressing as directed. Woven Gauze Sponge, Non-Sterile 4x4 in Discharge Instruction: Apply over primary dressing as directed. Secured With Transpore Surgical Tape, 2x10 (in/yd) Discharge Instruction: Secure dressing with tape as directed. Compression Wrap ThreePress (3 layer compression wrap) Discharge Instruction: Apply three layer compression as directed. Tubular netting #5 Compression Stockings Add-Ons Electronic Signature(s) Signed: 07/26/2022 5:01:03 PM By: Blanche East RN Entered By: Blanche East on 07/26/2022 08:35:07 -------------------------------------------------------------------------------- Vitals Details Patient Name: Date of Service: Dorothy Robbins, NA NCY S. 07/26/2022 8:30 A M Medical Record Number: [id number] Patient Account Number: [id number] ALEXIUS, SWINDERMAN ([id number]) [phone].pdf Page 7 of 7 Date of Birth/Sex: Treating RN: 08-13-1950 (72 y.o. F) Primary Care Zyaire Dumas: Other Clinician: CA RRO LL, ERIN Referring Demetrus Pavao: Treating Terresa Marlett/Extender: Fredirick Maudlin CA RRO LL, ERIN Weeks in Treatment: 2 Vital Signs Time Taken: 08:14 Temperature (F): 97.9 Height (in): 64 Pulse (bpm): 80 Weight (lbs): 185 Respiratory Rate (breaths/min): 20 Body Mass Index (BMI):  31.8 Blood Pressure (mmHg): 133/78 Capillary Blood Glucose (mg/dl): 131 Reference Range: 80 - 120 mg / dl Electronic Signature(s) Signed: 07/26/2022 10:06:07 AM By: Harrington Challenger,  Aisha Entered ByWorthy Rancher on 07/26/2022 08:15:12

## 2022-08-02 DIAGNOSIS — K080 Exfoliation of teeth due to systemic causes: Secondary | ICD-10-CM | POA: Diagnosis not present

## 2022-08-03 ENCOUNTER — Encounter (HOSPITAL_BASED_OUTPATIENT_CLINIC_OR_DEPARTMENT_OTHER): Payer: Medicare Other | Admitting: Internal Medicine

## 2022-08-03 DIAGNOSIS — L4059 Other psoriatic arthropathy: Secondary | ICD-10-CM | POA: Diagnosis not present

## 2022-08-03 DIAGNOSIS — E1151 Type 2 diabetes mellitus with diabetic peripheral angiopathy without gangrene: Secondary | ICD-10-CM | POA: Diagnosis not present

## 2022-08-03 DIAGNOSIS — I890 Lymphedema, not elsewhere classified: Secondary | ICD-10-CM | POA: Diagnosis not present

## 2022-08-03 DIAGNOSIS — M48062 Spinal stenosis, lumbar region with neurogenic claudication: Secondary | ICD-10-CM | POA: Diagnosis not present

## 2022-08-03 DIAGNOSIS — E11622 Type 2 diabetes mellitus with other skin ulcer: Secondary | ICD-10-CM | POA: Diagnosis not present

## 2022-08-03 DIAGNOSIS — M1990 Unspecified osteoarthritis, unspecified site: Secondary | ICD-10-CM | POA: Diagnosis not present

## 2022-08-03 DIAGNOSIS — I10 Essential (primary) hypertension: Secondary | ICD-10-CM | POA: Diagnosis not present

## 2022-08-03 DIAGNOSIS — L97812 Non-pressure chronic ulcer of other part of right lower leg with fat layer exposed: Secondary | ICD-10-CM | POA: Diagnosis not present

## 2022-08-03 NOTE — Progress Notes (Signed)
MISHELE, WEIK ([id number]) [id number] Page 1 of 1 Visit Report for 08/03/2022 SuperBill Details Patient Name: Date of Service: Dorothy Robbins 08/03/2022 Medical Record Number: [id number] Patient Account Number: [id number] Date of Birth/Sex: Treating RN: [birth year] (72 y.o. F) Zochol, Jamie Primary Care Provider: CA RRO LL, ERIN Other Clinician: Referring Provider: Treating Provider/Extender: Kalman Shan CA RRO LL, ERIN Weeks in Treatment: 3 Diagnosis Coding ICD-10 Codes Code Description [phone] Non-pressure chronic ulcer of other part of right lower leg with fat layer exposed E11.9 Type 2 diabetes mellitus without complications [id number] Essential (primary) hypertension M48.062 Spinal stenosis, lumbar region with neurogenic claudication L40.59 Other psoriatic arthropathy Facility Procedures CPT4 Code Description Modifier Quantity [id number] (Facility Use Only) [phone] - APPLY MULTLAY COMPRS LWR RT LEG 1 ICD-10 Diagnosis Description [id number] Non-pressure chronic ulcer of other part of right lower leg with fat layer exposed Electronic Signature(s) Signed: 08/03/2022 11:35:22 AM By: Blanche East RN Signed: 08/03/2022 12:33:12 PM By: Kalman Shan DO Entered By: Blanche East on 08/03/2022 11:35:22

## 2022-08-07 NOTE — Progress Notes (Signed)
SHERRILL, MCNALLEY ([id number]) [id number] Page 1 of 4 Visit Report for 08/03/2022 Arrival Information Details Patient Name: Date of Service: Dorothy Robbins, [place] 08/03/2022 11:15 A M Medical Record Number: [id number] Patient Account Number: [id number] Date of Birth/Sex: Treating RN: 1951-01-31 (72 y.o. F) Zochol, Jamie Primary Care Dorothy Robbins: CA RRO LL, [place] Other Clinician: Referring Brelan Hannen: Treating Jaymason Ledesma/Extender: Kalman Shan CA RRO LL, ERIN Weeks in Treatment: 3 Visit Information History Since Last Visit Added or deleted any medications: No Patient Arrived: Ambulatory Any new allergies or adverse reactions: No Arrival Time: 11:09 Had a fall or experienced change in No Accompanied By: self activities of daily living that may affect Transfer Assistance: None risk of falls: Patient Identification Verified: Yes Signs or symptoms of abuse/neglect since last visito No Secondary Verification Process Completed: Yes Hospitalized since last visit: No Patient Requires Transmission-Based Precautions: No Implantable device outside of the clinic excluding No Patient Has Alerts: No cellular tissue based products placed in the center since last visit: Has Compression in Place as Prescribed: Yes Pain Present Now: Yes Electronic Signature(Robbins) Signed: 08/06/2022 5:06:20 PM By: Blanche East RN Entered By: Blanche East on 08/03/2022 11:12:41 -------------------------------------------------------------------------------- Compression Therapy Details Patient Name: Date of Service: Dorothy Mocha Robbins. 08/03/2022 11:15 A M Medical Record Number: [id number] Patient Account Number: [id number] Date of Birth/Sex: Treating RN: 1951-05-14 (72 y.o. F) Zochol, Jamie Primary Care Kani Jobson: CA RRO Edwyna Shell, [place] Other Clinician: Referring Sheniya Garciaperez: Treating Teniya Filter/Extender: Kalman Shan CA RRO LL, ERIN Weeks in Treatment: 3 Compression Therapy Performed for Wound  Assessment: Wound #1 Right,Anterior Lower Leg Performed By: Clinician Blanche East, RN Compression Type: Three Layer Electronic Signature(Robbins) Signed: 08/03/2022 11:35:43 AM By: Blanche East RN Entered By: Blanche East on 08/03/2022 11:35:43 -------------------------------------------------------------------------------- Encounter Discharge Information Details Patient Name: Date of Service: Dorothy Robbins, Dorothy NCY Robbins. 08/03/2022 11:15 A M Medical Record Number: [id number] Patient Account Number: [id number] Date of Birth/Sex: Treating RN: 07/20/1950 (72 y.o. F) Zochol, Jamie Primary Care Lilou Kneip: CA RRO Edwyna Shell, [place] Other Clinician: Referring Wandalene Abrams: Treating Veola Cafaro/Extender: Kalman Shan CA RRO LL, ERIN Weeks in Treatment: 3 Encounter Discharge Information Items Discharge Condition: Stable Ambulatory Status: Ambulatory Discharge Destination: Home Transportation: Private Auto Accompanied By: self Schedule Follow-up Appointment: Yes Clinical Summary of Care: Dorothy Robbins, Dorothy Robbins ([id number]) [id number] Page 2 of 4 Electronic Signature(Robbins) Signed: 08/03/2022 11:34:45 AM By: Blanche East RN Entered By: Blanche East on 08/03/2022 11:34:45 -------------------------------------------------------------------------------- Patient/Caregiver Education Details Patient Name: Date of Service: Dorothy Robbins, Dorothy Robbins 3/15/2024andnbsp11:15 [place] Record Number: [id number] Patient Account Number: [id number] Date of Birth/Gender: Treating RN: 1950/06/26 (72 y.o. F) Zochol, Jamie Primary Care Physician: CA RRO Edwyna Shell, [place] Other Clinician: Referring Physician: Treating Physician/Extender: Kalman Shan CA RRO LL, ERIN Weeks in Treatment: 3 Education Assessment Education Provided To: Patient Education Topics Provided Wound/Skin Impairment: Methods: Explain/Verbal Responses: Reinforcements needed, State content correctly Electronic Signature(Robbins) Signed: 08/06/2022 5:06:20 PM  By: Blanche East RN Entered By: Blanche East on 08/03/2022 11:34:33 -------------------------------------------------------------------------------- Wound Assessment Details Patient Name: Date of Service: Dorothy Mocha Robbins. 08/03/2022 11:15 A M Medical Record Number: [id number] Patient Account Number: [id number] Date of Birth/Sex: Treating RN: [birth year] (72 y.o. F) Zochol, Jamie Primary Care Deno Sida: CA RRO Edwyna [place], [place] Other Clinician: Referring Rhondalyn Clingan: Treating Tomi Paddock/Extender: Kalman Shan CA RRO LL, ERIN Weeks in Treatment: 3 Wound Status Wound Number: 1 Primary Etiology: Atypical Wound Location: Right, Anterior Lower Leg Wound Status: Open Wounding Event: Surgical Injury Notes: Squamous cell carcinoma. Had Mohs surgery 06/07/22 Date Acquired:  06/14/2022 Comorbid History: Hypertension, Type II Diabetes, Osteoarthritis Weeks Of Treatment: 3 Clustered Wound: No Wound Measurements Length: (cm) 1.2 Width: (cm) 1.3 Depth: (cm) 0.1 Area: (cm) 1.225 Volume: (cm) 0.123 % Reduction in Area: 68.8% % Reduction in Volume: 68.7% Epithelialization: Small (1-33%) Wound Description Classification: Full Thickness With Exposed Suppo Exudate Amount: Medium rt Structures Foul Odor After Cleansing: No Slough/Fibrino Yes Wound Bed Granulation Amount: Large (67-100%) Exposed Structure Granulation Quality: Red, Hyper-granulation Fat Layer (Subcutaneous Tissue) Exposed: Yes Necrotic Amount: Small (1-33%) Necrotic Quality: Adherent [address] Dorothy Robbins, Dorothy Robbins ([id number]) [id number] Page 3 of 4 No Abnormalities Noted: No No Abnormalities Noted: Yes Scarring: Yes Temperature / Pain Temperature: No Abnormality Moisture No Abnormalities Noted: Yes Treatment Notes Wound #1 (Lower Leg) Wound Laterality: Right, Anterior Cleanser Soap and Water Discharge Instruction: May shower and wash wound with dial antibacterial soap  and water prior to dressing change. Wound Cleanser Discharge Instruction: Cleanse the wound with wound cleanser prior to applying a clean dressing using gauze sponges, not tissue or cotton balls. Peri-Wound Care Sween Lotion (Moisturizing lotion) Discharge Instruction: Apply moisturizing lotion as directed Topical Primary Dressing Sorbalgon AG Dressing 2x2 (in/in) Discharge Instruction: Apply to wound bed as instructed Secondary Dressing ABD Pad, 8x10 Discharge Instruction: Apply over primary dressing as directed. Woven Gauze Sponge, Non-Sterile 4x4 in Discharge Instruction: Apply over primary dressing as directed. Secured With Transpore Surgical Tape, 2x10 (in/yd) Discharge Instruction: Secure dressing with tape as directed. Compression Wrap ThreePress (3 layer compression wrap) Discharge Instruction: Apply three layer compression as directed. Tubular netting #5 Compression Stockings Add-Ons Electronic Signature(Robbins) Signed: 08/03/2022 11:33:52 AM By: Blanche East RN Entered By: Blanche East on 08/03/2022 11:33:52 -------------------------------------------------------------------------------- Vitals Details Patient Name: Date of Service: Dorothy Robbins, Dorothy NCY Robbins. 08/03/2022 11:15 A M Medical Record Number: [id number] Patient Account Number: [id number] Date of Birth/Sex: Treating RN: Oct 10, 1950 (72 y.o. F) Zochol, [place] Primary Care Adeleigh Barletta: CA RRO LL, ERIN Other Clinician: Referring Jaycelynn Knickerbocker: Treating Mayerly Kaman/Extender: Kalman Shan CA RRO LL, ERIN Weeks in Treatment: 3 Vital Signs Time Taken: 11:14 Temperature (F): 98.3 Height (in): 64 Pulse (bpm): 86 Weight (lbs): 185 Respiratory Rate (breaths/min): 20 Body Mass Index (BMI): 31.8 Blood Pressure (mmHg): 144/81 Capillary Blood Glucose (mg/dl): 140 Reference Range: 80 - 120 mg / dl Electronic Signature(Robbins) Signed: 08/06/2022 5:06:20 PM By: Blanche East RN Entered By: Blanche East on 08/03/2022 11:15:34 Dorothy Robbins ([id number]) [id number] Page 4 of 4

## 2022-08-09 ENCOUNTER — Encounter (HOSPITAL_BASED_OUTPATIENT_CLINIC_OR_DEPARTMENT_OTHER): Payer: Medicare Other | Admitting: General Surgery

## 2022-08-09 DIAGNOSIS — M48062 Spinal stenosis, lumbar region with neurogenic claudication: Secondary | ICD-10-CM | POA: Diagnosis not present

## 2022-08-09 DIAGNOSIS — I10 Essential (primary) hypertension: Secondary | ICD-10-CM | POA: Diagnosis not present

## 2022-08-09 DIAGNOSIS — E1151 Type 2 diabetes mellitus with diabetic peripheral angiopathy without gangrene: Secondary | ICD-10-CM | POA: Diagnosis not present

## 2022-08-09 DIAGNOSIS — M1990 Unspecified osteoarthritis, unspecified site: Secondary | ICD-10-CM | POA: Diagnosis not present

## 2022-08-09 DIAGNOSIS — I890 Lymphedema, not elsewhere classified: Secondary | ICD-10-CM | POA: Diagnosis not present

## 2022-08-09 DIAGNOSIS — T8189XA Other complications of procedures, not elsewhere classified, initial encounter: Secondary | ICD-10-CM | POA: Diagnosis not present

## 2022-08-09 DIAGNOSIS — L4059 Other psoriatic arthropathy: Secondary | ICD-10-CM | POA: Diagnosis not present

## 2022-08-09 DIAGNOSIS — E11622 Type 2 diabetes mellitus with other skin ulcer: Secondary | ICD-10-CM | POA: Diagnosis not present

## 2022-08-09 DIAGNOSIS — L97812 Non-pressure chronic ulcer of other part of right lower leg with fat layer exposed: Secondary | ICD-10-CM | POA: Diagnosis not present

## 2022-08-10 NOTE — Progress Notes (Signed)
Dorothy Robbins, Dorothy Robbins ([id number]) [id number] Page 1 of 6 Visit Report for 08/09/2022 Arrival Information Details Patient Name: Date of Service: Dorothy Robbins 08/09/2022 9:15 A M Medical Record Number: [id number] Patient Account Number: [id number] Date of Birth/Sex: Treating RN: 02/15/1951 (72 y.o. F) Zochol, Jamie Primary Care Aveleen Nevers: [place] RRO LL, ERIN Other Clinician: Referring Milany Geck: Treating Ventura Leggitt/Extender: Fredirick Maudlin [place] RRO LL, ERIN Weeks in Treatment: 4 Visit Information History Since Last Visit Added or deleted any medications: No Patient Arrived: Ambulatory Any new allergies or adverse reactions: No Arrival Time: 09:20 Had a fall or experienced change in No Accompanied By: self activities of daily living that may affect Transfer Assistance: None risk of falls: Patient Identification Verified: Yes Signs or symptoms of abuse/neglect since last visito No Secondary Verification Process Completed: Yes Hospitalized since last visit: No Patient Requires Transmission-Based Precautions: No Implantable device outside of the clinic excluding No Patient Has Alerts: No cellular tissue based products placed in the center since last visit: Has Compression in Place as Prescribed: Yes Pain Present Now: Yes Electronic Signature(Robbins) Signed: 08/09/2022 4:19:16 PM By: Blanche East RN Entered By: Blanche East on 08/09/2022 09:20:48 -------------------------------------------------------------------------------- Clinic Level of Care Assessment Details Patient Name: Date of Service: Dorothy Robbins 08/09/2022 9:15 A M Medical Record Number: [id number] Patient Account Number: [id number] Date of Birth/Sex: Treating RN: 28-Oct-1950 (72 y.o. F) Zochol, Jamie Primary Care Kenai Fluegel: [place] RRO LL, ERIN Other Clinician: Referring Henning Ehle: Treating Rogan Wigley/Extender: Fredirick Maudlin [place] RRO LL, ERIN Weeks in Treatment: 4 Clinic Level of Care Assessment  Items TOOL 4 Quantity Score X- 1 0 Use when only an EandM is performed on FOLLOW-UP visit ASSESSMENTS - Nursing Assessment / Reassessment X- 1 10 Reassessment of Co-morbidities (includes updates in patient status) X- 1 5 Reassessment of Adherence to Treatment Plan ASSESSMENTS - Wound and Skin A ssessment / Reassessment X - Simple Wound Assessment / Reassessment - one wound 1 5 []  - 0 Complex Wound Assessment / Reassessment - multiple wounds []  - 0 Dermatologic / Skin Assessment (not related to wound area) ASSESSMENTS - Focused Assessment X- 1 5 Circumferential Edema Measurements - multi extremities []  - 0 Nutritional Assessment / Counseling / Intervention []  - 0 Lower Extremity Assessment (monofilament, tuning fork, pulses) []  - 0 Peripheral Arterial Disease Assessment (using hand held doppler) ASSESSMENTS - Ostomy and/or Continence Assessment and Care []  - 0 Incontinence Assessment and Management []  - 0 Ostomy Care Assessment and Management (repouching, etc.) PROCESS - Coordination of Care X - Simple Patient / Family Education for ongoing care 1 15 Dorothy Robbins, Dorothy Robbins ([id number]) [id number] Page 2 of 6 []  - 0 Complex (extensive) Patient / Family Education for ongoing care X- 1 10 Staff obtains Consents, Records, T Results / Process Orders est X- 1 10 Staff telephones HHA, Nursing Homes / Clarify orders / etc []  - 0 Routine Transfer to another Facility (non-emergent condition) []  - 0 Routine Hospital Admission (non-emergent condition) []  - 0 New Admissions / Biomedical engineer / Ordering NPWT Apligraf, etc. , []  - 0 Emergency Hospital Admission (emergent condition) X- 1 10 Simple Discharge Coordination []  - 0 Complex (extensive) Discharge Coordination PROCESS - Special Needs []  - 0 Pediatric / Minor Patient Management []  - 0 Isolation Patient Management []  - 0 Hearing / Language / Visual special needs []  - 0 Assessment of  Community assistance (transportation, D/C planning, etc.) []  - 0 Additional assistance / Altered mentation []  - 0 Support Surface(Robbins) Assessment (bed, cushion, seat, etc.)  INTERVENTIONS - Wound Cleansing / Measurement X - Simple Wound Cleansing - one wound 1 5 []  - 0 Complex Wound Cleansing - multiple wounds X- 1 5 Wound Imaging (photographs - any number of wounds) []  - 0 Wound Tracing (instead of photographs) X- 1 5 Simple Wound Measurement - one wound []  - 0 Complex Wound Measurement - multiple wounds INTERVENTIONS - Wound Dressings X - Small Wound Dressing one or multiple wounds 1 10 []  - 0 Medium Wound Dressing one or multiple wounds []  - 0 Large Wound Dressing one or multiple wounds []  - 0 Application of Medications - topical []  - 0 Application of Medications - injection INTERVENTIONS - Miscellaneous []  - 0 External ear exam []  - 0 Specimen Collection (cultures, biopsies, blood, body fluids, etc.) []  - 0 Specimen(Robbins) / Culture(Robbins) sent or taken to Lab for analysis []  - 0 Patient Transfer (multiple staff / Civil Service fast streamer / Similar devices) []  - 0 Simple Staple / Suture removal (25 or less) []  - 0 Complex Staple / Suture removal (26 or more) []  - 0 Hypo / Hyperglycemic Management (close monitor of Blood Glucose) []  - 0 Ankle / Brachial Index (ABI) - do not check if billed separately X- 1 5 Vital Signs Has the patient been seen at the hospital within the last three years: Yes Total Score: 100 Level Of Care: New/Established - Level 3 Electronic Signature(Robbins) Signed: 08/09/2022 4:19:16 PM By: Blanche East RN Entered By: Blanche East on 08/09/2022 09:35:16 Dorothy Robbins ([id number]) [id number] Page 3 of 6 -------------------------------------------------------------------------------- Lower Extremity Assessment Details Patient Name: Date of Service: Dorothy Robbins 08/09/2022 9:15 A M Medical Record Number: [id number] Patient Account  Number: [id number] Date of Birth/Sex: Treating RN: [birth year] (72 y.o. F) Zochol, [place] Primary Care Marykay Mccleod: [place] RRO Edwyna Shell, [place] Other Clinician: Referring Scottlynn Lindell: Treating Purvi Ruehl/Extender: Fredirick Maudlin [place] RRO LL, ERIN Weeks in Treatment: 4 Edema Assessment Assessed: [Left: No] [Right: No] [Left: Edema] [Right: :] Calf Left: Right: Point of Measurement: 30 cm From Medial Instep 36.4 cm Ankle Left: Right: Point of Measurement: 10 cm From Medial Instep 21.5 cm Vascular Assessment Pulses: Dorsalis Pedis Palpable: [Right:Yes] Electronic Signature(Robbins) Signed: 08/09/2022 4:19:16 PM By: Blanche East RN Entered By: Blanche East on 08/09/2022 09:23:14 -------------------------------------------------------------------------------- Multi Wound Chart Details Patient Name: Date of Service: Dorothy Robbins, Dorothy Dodrill Robbins. 08/09/2022 9:15 A M Medical Record Number: [id number] Patient Account Number: [id number] Date of Birth/Sex: Treating RN: 03/19/1951 (73 y.o. F) Primary Care Alexy Bringle: [place] RRO LL, ERIN Other Clinician: Referring Emilo Gras: Treating Ouida Abeyta/Extender: Fredirick Maudlin [place] RRO LL, ERIN Weeks in Treatment: 4 Vital Signs Height(in): 64 Pulse(bpm): 99 Weight(lbs): 185 Blood Pressure(mmHg): 147/76 Body Mass Index(BMI): 31.8 Temperature(F): 97.7 Respiratory Rate(breaths/min): 16 [1:Photos:] [N/A:N/A] Right, Anterior Lower Leg N/A N/A Wound Location: Surgical Injury N/A N/A Wounding Event: Atypical N/A N/A Primary Etiology: Hypertension, Type II Diabetes, N/A N/A Comorbid History: Osteoarthritis 06/14/2022 N/A N/A Date Acquired: 4 N/A N/A Weeks of TreatmentDANIRA, Dorothy Robbins ([id number]) [id number] Page 4 of 6 Healed - Epithelialized N/A N/A Wound Status: No N/A N/A Wound Recurrence: 0x0x0 N/A N/A Measurements L x W x D (cm) 0 N/A N/A A (cm) : rea 0 N/A N/A Volume (cm) : 99.80% N/A N/A % Reduction in Area: 99.70% N/A N/A % Reduction  in Volume: Full Thickness With Exposed Support N/A N/A Classification: Structures Medium N/A N/A Exudate A mount: Large (67-100%) N/A N/A Granulation A mount: Red, Hyper-granulation N/A N/A Granulation Quality: Small (1-33%) N/A N/A Necrotic A mount:  Fat Layer (Subcutaneous Tissue): Yes N/A N/A Exposed Structures: Small (1-33%) N/A N/A Epithelialization: Scarring: Yes N/A N/A Periwound Skin Texture: No Abnormalities Noted N/A N/A Periwound Skin Moisture: No Abnormalities Noted N/A N/A Periwound Skin Color: No Abnormality N/A N/A Temperature: Treatment Notes Electronic Signature(Robbins) Signed: 08/09/2022 9:35:48 AM By: Fredirick Maudlin MD FACS Entered By: Fredirick Maudlin on 08/09/2022 09:35:48 -------------------------------------------------------------------------------- Multi-Disciplinary Care Plan Details Patient Name: Date of Service: Dorothy Robbins, Dorothy Dodrill Robbins. 08/09/2022 9:15 A M Medical Record Number: [id number] Patient Account Number: [id number] Date of Birth/Sex: Treating RN: May 06, 1951 (72 y.o. Dorothy Robbins Primary Care Alisia Vanengen: [place] RRO Edwyna [place], [place] Other Clinician: Referring Devory Mckinzie: Treating Tynika Luddy/Extender: Fredirick Maudlin [place] RRO LL, ERIN Weeks in Treatment: 4 Active Inactive Electronic Signature(Robbins) Signed: 08/09/2022 4:19:16 PM By: Blanche East RN Entered By: Blanche East on 08/09/2022 09:29:52 -------------------------------------------------------------------------------- Pain Assessment Details Patient Name: Date of Service: Dorothy Mocha Robbins. 08/09/2022 9:15 A M Medical Record Number: [id number] Patient Account Number: [id number] Date of Birth/Sex: Treating RN: 1950-07-20 (72 y.o. F) Zochol, Jamie Primary Care Pax Reasoner: [place] RRO Edwyna [place], [place] Other Clinician: Referring Madyn Ivins: Treating Tagen Milby/Extender: Fredirick Maudlin [place] RRO LL, ERIN Weeks in Treatment: 4 Active Problems Location of Pain Severity and Description of Pain Patient Has Paino No Site  Locations Rate the pain. Dorothy Robbins, Dorothy Robbins ([id number]) [id number] Page 5 of 6 Rate the pain. Current Pain Level: 0 Pain Management and Medication Current Pain Management: Electronic Signature(Robbins) Signed: 08/09/2022 4:19:16 PM By: Blanche East RN Entered By: Blanche East on 08/09/2022 09:21:26 -------------------------------------------------------------------------------- Patient/Caregiver Education Details Patient Name: Date of Service: Dorothy Robbins 3/21/2024andnbsp9:15 [place] Record Number: [id number] Patient Account Number: [id number] Date of Birth/Gender: Treating RN: 20-Dec-1950 (72 y.o. Dorothy Robbins Primary Care Physician: [place] RRO Edwyna [place], [place] Other Clinician: Referring Physician: Treating Physician/Extender: Fredirick Maudlin [place] RRO LL, ERIN Weeks in Treatment: 4 Education Assessment Education Provided To: Patient Education Topics Provided Wound Debridement: Electronic Signature(Robbins) Signed: 08/09/2022 4:19:16 PM By: Blanche East RN Entered By: Blanche East on 08/09/2022 09:30:12 -------------------------------------------------------------------------------- Wound Assessment Details Patient Name: Date of Service: Dorothy Mocha Robbins. 08/09/2022 9:15 A M Medical Record Number: [id number] Patient Account Number: [id number] Date of Birth/Sex: Treating RN: December 20, 1950 (72 y.o. F) Zochol, Jamie Primary Care Chuk[place] Ewen: [place] RRO Edwyna [place], [place] Other Clinician: Referring Salle Brandle: Treating Lizzett Nobile/Extender: Fredirick Maudlin [place] RRO LL, ERIN Weeks in Treatment: 4 Wound Status Wound Number: 1 Primary Etiology: Atypical Wound Location: Right, Anterior Lower Leg Wound Status: Healed - Epithelialized Wounding Event: Surgical Injury Notes: Squamous cell carcinoma. Had Mohs surgery 06/07/22 Date Acquired: 06/14/2022 Comorbid History: Hypertension, Type II Diabetes, Osteoarthritis Weeks Of Treatment: 4 Clustered Wound: No Photos Dorothy Robbins, Dorothy Robbins  ([id number]) [id number] Page 6 of 6 Wound Measurements Length: (cm) Width: (cm) Depth: (cm) Area: (cm) Volume: (cm) 0 % Reduction in Area: 99.8% 0 % Reduction in Volume: 99.7% 0 Epithelialization: Small (1-33%) 0 Tunneling: No 0 Undermining: No Wound Description Classification: Full Thickness With Exposed Support Structures Exudate Amount: Medium Foul Odor After Cleansing: No Slough/Fibrino Yes Wound Bed Granulation Amount: Large (67-100%) Exposed Structure Granulation Quality: Red, Hyper-granulation Fat Layer (Subcutaneous Tissue) Exposed: Yes Necrotic Amount: Small (1-33%) Periwound Skin Texture Texture Color No Abnormalities Noted: No No Abnormalities Noted: Yes Scarring: Yes Temperature / Pain Temperature: No Abnormality Moisture No Abnormalities Noted: Yes Electronic Signature(Robbins) Signed: 08/09/2022 4:19:16 PM By: Blanche East RN Entered By: Blanche East on 08/09/2022 09:31:44 -------------------------------------------------------------------------------- Vitals Details Patient Name: Date of Service: Dorothy Robbins, Dorothy NCY Robbins. 08/09/2022  [place] Record Number: [id number] Patient Account Number: [id number] Date of Birth/Sex: Treating RN: May 25, 1950 (72 y.o. F) Zochol, [place] Primary Care Atira Borello: [place] RRO LL, [place] Other Clinician: Referring Takari Lundahl: Treating Marcellina Jonsson/Extender: Fredirick Maudlin [place] RRO LL, ERIN Weeks in Treatment: 4 Vital Signs Time Taken: 09:20 Temperature (F): 97.7 Height (in): 64 Pulse (bpm): 99 Weight (lbs): 185 Respiratory Rate (breaths/min): 16 Body Mass Index (BMI): 31.8 Blood Pressure (mmHg): 147/76 Reference Range: 80 - 120 mg / dl Electronic Signature(Robbins) Signed: 08/09/2022 4:19:16 PM By: Blanche East RN Entered By: Blanche East on 08/09/2022 09:21:08

## 2022-08-10 NOTE — Progress Notes (Signed)
Dorothy Robbins ([id number]) [id number] Page 1 of 6 Visit Report for 08/09/2022 Chief Complaint Document Details Patient Name: Date of Service: Dorothy Robbins 08/09/2022 9:15 A M Medical Record Number: [id number] Patient Account Number: [id number] Date of Birth/Sex: Treating RN: 04/17/1951 (72 y.o. F) Primary Care Provider: CA RRO LL, ERIN Other Clinician: Referring Provider: Treating Provider/Extender: Fredirick Maudlin CA RRO LL, ERIN Weeks in Treatment: 4 Information Obtained from: Patient Chief Complaint Patient presents to the wound care center with open non-healing surgical wound(s) Electronic Signature(s) Signed: 08/09/2022 9:35:55 AM By: Fredirick Maudlin MD FACS Entered By: Fredirick Maudlin on 08/09/2022 09:35:55 -------------------------------------------------------------------------------- HPI Details Patient Name: Date of Service: Dorothy Robbins, NA NCY S. 08/09/2022 9:15 A M Medical Record Number: [id number] Patient Account Number: [id number] Date of Birth/Sex: Treating RN: Oct 10, 1950 (72 y.o. F) Primary Care Provider: CA RRO Edwyna Shell, [place] Other Clinician: Referring Provider: Treating Provider/Extender: Fredirick Maudlin CA RRO LL, ERIN Weeks in Treatment: 4 History of Present Illness HPI Description: ADMISSION 07/10/2022 This is a 72 year old woman with type 2 diabetes, well-controlled on oral agents and psoriatic arthritis. She underwent a Mohs procedure on January 18 for squamous cell carcinoma on her leg. The wound became infected with Staph aureus. She was treated with topical mupirocin and doxycycline. She is no longer using either of these. She is due to start Deary for her psoriatic arthritis, but her rheumatologist wants her wound to heal before initiating this treatment. She has been applying Vaseline and a Band-Aid, per her dermatologist recommendation. On her right anterior tibial surface, there is a circular wound with perimeter healing  already present. There is a layer of rubbery slough. There is no erythema, induration, purulent drainage, or other signs of infection. 07/18/2022: Her wound is smaller by a centimeter. There has been some accumulation of slough. No concern for infection. 07/26/2022: Her wound continues to contract. It is extremely clean. 08/09/2022: Her wound is healed. Electronic Signature(s) Signed: 08/09/2022 9:36:06 AM By: Fredirick Maudlin MD FACS Entered By: Fredirick Maudlin on 08/09/2022 09:36:06 -------------------------------------------------------------------------------- Physical Exam Details Patient Name: Date of Service: Dorothy Robbins, [name] 08/09/2022 9:15 A M Medical Record Number: [id number] Patient Account Number: [id number] Date of Birth/Sex: Treating RN: Dec 14, 1950 (72 y.o. F) Primary Care Provider: CA RRO LL, ERIN Other Clinician: Referring Provider: Treating Provider/Extender: Fredirick Maudlin CA RRO LL, ERIN Weeks in Treatment: 4 Constitutional Slightly hypertensive. . . . no acute distress. Dorothy Robbins ([id number]) [id number] Page 2 of 6 Respiratory Normal work of breathing on room air. Notes 08/09/2022: Her wound is healed. Electronic Signature(s) Signed: 08/09/2022 9:37:22 AM By: Fredirick Maudlin MD FACS Entered By: Fredirick Maudlin on 08/09/2022 09:37:22 -------------------------------------------------------------------------------- Physician Orders Details Patient Name: Date of Service: Dorothy Robbins, NA NCY S. 08/09/2022 9:15 A M Medical Record Number: [id number] Patient Account Number: [id number] Date of Birth/Sex: Treating RN: 1950/06/26 (72 y.o. F) Zochol, [employer] Primary Care Provider: CA RRO LL, [place] Other Clinician: Referring Provider: Treating Provider/Extender: Fredirick Maudlin CA RRO LL, ERIN Weeks in Treatment: 4 Verbal / Phone Orders: No Diagnosis Coding ICD-10 Coding Code Description [phone] Non-pressure chronic ulcer of other part of  right lower leg with fat layer exposed E11.9 Type 2 diabetes mellitus without complications [id number] Essential (primary) hypertension M48.062 Spinal stenosis, lumbar region with neurogenic claudication L40.59 Other psoriatic arthropathy Discharge From [facility] Services Discharge from [place] on healing!! it has been a pleasure caring for you!! Anesthetic (In clinic) Topical Lidocaine 5% applied to wound bed - Used in  Clinic Bathing/ Shower/ Hygiene May shower and wash wound with soap and water. Edema Control - Lymphedema / SCD / Other Avoid standing for long periods of time. Other Edema Control Orders/Instructions: - both legs Additional Orders / Instructions Follow Nutritious Diet - Please try and increase your daily protein intake to about 60-80gramms per day Electronic Signature(s) Signed: 08/09/2022 9:38:45 AM By: Fredirick Maudlin MD FACS Entered By: Fredirick Maudlin on 08/09/2022 09:37:32 -------------------------------------------------------------------------------- Problem List Details Patient Name: Date of Service: Dorothy Robbins, [name] 08/09/2022 9:15 A M Medical Record Number: [id number] Patient Account Number: [id number] Date of Birth/Sex: Treating RN: Nov 10, 1950 (72 y.o. F) Primary Care Provider: CA RRO Edwyna Shell, [place] Other Clinician: Referring Provider: Treating Provider/Extender: Fredirick Maudlin CA RRO LL, ERIN Weeks in Treatment: 4 Active Problems ICD-10 Encounter Code Description Active Date MDM Diagnosis L97.812 Non-pressure chronic ulcer of other part of right lower leg with fat layer 07/10/2022 No Yes Dorothy, Robbins ([id number]) [id number] Page 3 of 6 exposed E11.9 Type 2 diabetes mellitus without complications [id number] No Yes I10 Essential (primary) hypertension 07/10/2022 No Yes M48.062 Spinal stenosis, lumbar region with neurogenic claudication 07/10/2022 No Yes L40.59 Other psoriatic arthropathy 07/10/2022 No  Yes Inactive Problems Resolved Problems Electronic Signature(s) Signed: 08/09/2022 9:35:42 AM By: Fredirick Maudlin MD FACS Entered By: Fredirick Maudlin on 08/09/2022 09:35:42 -------------------------------------------------------------------------------- Progress Note Details Patient Name: Date of Service: Dorothy Robbins, [name] 08/09/2022 9:15 A M Medical Record Number: [id number] Patient Account Number: [id number] Date of Birth/Sex: Treating RN: 03-29-1951 (72 y.o. F) Primary Care Provider: CA RRO LL, ERIN Other Clinician: Referring Provider: Treating Provider/Extender: Fredirick Maudlin CA RRO LL, ERIN Weeks in Treatment: 4 Subjective Chief Complaint Information obtained from Patient Patient presents to the wound care center with open non-healing surgical wound(s) History of Present Illness (HPI) ADMISSION 07/10/2022 This is a 72 year old woman with type 2 diabetes, well-controlled on oral agents and psoriatic arthritis. She underwent a Mohs procedure on January 18 for squamous cell carcinoma on her leg. The wound became infected with Staph aureus. She was treated with topical mupirocin and doxycycline. She is no longer using either of these. She is due to start Goulds for her psoriatic arthritis, but her rheumatologist wants her wound to heal before initiating this treatment. She has been applying Vaseline and a Band-Aid, per her dermatologist recommendation. On her right anterior tibial surface, there is a circular wound with perimeter healing already present. There is a layer of rubbery slough. There is no erythema, induration, purulent drainage, or other signs of infection. 07/18/2022: Her wound is smaller by a centimeter. There has been some accumulation of slough. No concern for infection. 07/26/2022: Her wound continues to contract. It is extremely clean. 08/09/2022: Her wound is healed. Patient History Information obtained from Patient. Family History Unknown History. Social  History Never smoker, Marital Status - Married, Alcohol Use - Rarely, Drug Use - No History, Caffeine Use - Daily - coffee. Medical History Cardiovascular Patient has history of Hypertension Endocrine Patient has history of Type II Diabetes Musculoskeletal Dorothy, Robbins ([id number]) [id number] Page 4 of 6 Patient has history of Osteoarthritis - Hips,feet, back and knees Hospitalization/Surgery History - Mohs Surgery 06/07/22;back surgery;lip surgery. Medical A Surgical History Notes nd Musculoskeletal Hx: Psoriatic arthritis Oncologic R Lower Leg- Squamous cell carcinoma Objective Constitutional Slightly hypertensive. no acute distress. Vitals Time Taken: 9:20 AM, Height: 64 in, Weight: 185 lbs, BMI: 31.8, Temperature: 97.7 F, Pulse: 99 bpm, Respiratory Rate: 16 breaths/min, Blood Pressure: 147/76 mmHg. Respiratory Normal work  of breathing on room air. General Notes: 08/09/2022: Her wound is healed. Integumentary (Hair, Skin) Wound #1 status is Healed - Epithelialized. Original cause of wound was Surgical Injury. The date acquired was: 06/14/2022. The wound has been in treatment 4 weeks. The wound is located on the Right,Anterior Lower Leg. The wound measures 0cm length x 0cm width x 0cm depth; 0cm^2 area and 0cm^3 volume. There is Fat Layer (Subcutaneous Tissue) exposed. There is no tunneling or undermining noted. There is a medium amount of drainage noted. There is large (67- 100%) red, hyper - granulation within the wound bed. There is a small (1-33%) amount of necrotic tissue within the wound bed. The periwound skin appearance had no abnormalities noted for moisture. The periwound skin appearance had no abnormalities noted for color. The periwound skin appearance exhibited: Scarring. Periwound temperature was noted as No Abnormality. Assessment Active Problems ICD-10 Non-pressure chronic ulcer of other part of right lower leg with fat layer  exposed Type 2 diabetes mellitus without complications Essential (primary) hypertension Spinal stenosis, lumbar region with neurogenic claudication Other psoriatic arthropathy Plan Discharge From [facility] Services: Discharge from [facility] on healing!! it has been a pleasure caring for you!! Anesthetic: (In clinic) Topical Lidocaine 5% applied to wound bed - Used in Clinic Bathing/ Shower/ Hygiene: May shower and wash wound with soap and water. Edema Control - Lymphedema / SCD / Other: Avoid standing for long periods of time. Other Edema Control Orders/Instructions: - both legs Additional Orders / Instructions: Follow Nutritious Diet - Please try and increase your daily protein intake to about 60-80gramms per day 08/09/2022: Her wound is healed. She is cleared from a wound care perspective to begin receiving Skyrizi for her psoriatic arthritis. We will discharge her from the wound care center. She may follow-up as needed. Electronic Signature(s) Signed: 08/09/2022 9:37:59 AM By: Fredirick Maudlin MD FACS Entered By: Fredirick Maudlin on 08/09/2022 09:37:59 Dorothy Robbins ([id number]) [id number] Page 5 of 6 -------------------------------------------------------------------------------- HxROS Details Patient Name: Date of Service: Dorothy Robbins 08/09/2022 9:15 A M Medical Record Number: [id number] Patient Account Number: [id number] Date of Birth/Sex: Treating RN: 07/16/1950 (72 y.o. F) Primary Care Provider: CA RRO LL, ERIN Other Clinician: Referring Provider: Treating Provider/Extender: Fredirick Maudlin CA RRO LL, ERIN Weeks in Treatment: 4 Information Obtained From Patient Cardiovascular Medical History: Positive for: Hypertension Endocrine Medical History: Positive for: Type II Diabetes Time with diabetes: 10 years Treated with: Oral agents Blood sugar tested every day: Yes Tested : Every day Musculoskeletal Medical  History: Positive for: Osteoarthritis - Hips,feet, back and knees Past Medical History Notes: Hx: Psoriatic arthritis Oncologic Medical History: Past Medical History Notes: R Lower Leg- Squamous cell carcinoma Immunizations Pneumococcal Vaccine: Received Pneumococcal Vaccination: Yes Received Pneumococcal Vaccination On or After 60th Birthday: Yes Implantable Devices None Hospitalization / Surgery History Type of Hospitalization/Surgery Mohs Surgery 06/07/22;back surgery;lip surgery Family and Social History Unknown History: Yes; Never smoker; Marital Status - Married; Alcohol Use: Rarely; Drug Use: No History; Caffeine Use: Daily - coffee; Financial Concerns: No; Food, Clothing or Shelter Needs: No; Support System Lacking: No; Transportation Concerns: No Electronic Signature(s) Signed: 08/09/2022 9:38:45 AM By: Fredirick Maudlin MD FACS Entered By: Fredirick Maudlin on 08/09/2022 09:36:46 -------------------------------------------------------------------------------- SuperBill Details Patient Name: Date of Service: Dorothy Robbins, Robet Leu. 08/09/2022 Medical Record Number: [id number] Patient Account Number: [id number] Date of Birth/Sex: Treating RN: [birth year] (72 y.o. F) Zochol, Jamie Primary Care Provider: CA RRO Alberteen Spindle Other Clinician: Referring Provider: Treating  Provider/Extender: Fredirick Maudlin CA RRO LL, ERIN Weeks in Treatment: 4 Dorothy, Robbins ([id number]) [id number] Page 6 of 6 Diagnosis Coding ICD-10 Codes Code Description [phone] Non-pressure chronic ulcer of other part of right lower leg with fat layer exposed E11.9 Type 2 diabetes mellitus without complications [id number] Essential (primary) hypertension M48.062 Spinal stenosis, lumbar region with neurogenic claudication L40.59 Other psoriatic arthropathy Facility Procedures : CPT4 Code: [id number] Description: [id number] - WOUND CARE VISIT-LEV 3 EST PT Modifier: 25 Quantity: 1 Physician  Procedures : CPT4 Code Description Modifier [id number] - WC PHYS LEVEL 3 - EST PT ICD-10 Diagnosis Description [id number] Non-pressure chronic ulcer of other part of right lower leg with fat layer exposed E11.9 Type 2 diabetes mellitus without complications [id number]  Other psoriatic arthropathy I10 Essential (primary) hypertension Quantity: 1 Electronic Signature(s) Signed: 08/09/2022 9:38:24 AM By: Fredirick Maudlin MD FACS Entered By: Fredirick Maudlin on 08/09/2022 09:38:23

## 2022-08-13 DIAGNOSIS — H00015 Hordeolum externum left lower eyelid: Secondary | ICD-10-CM | POA: Diagnosis not present

## 2022-08-13 DIAGNOSIS — H109 Unspecified conjunctivitis: Secondary | ICD-10-CM | POA: Diagnosis not present

## 2022-08-15 ENCOUNTER — Telehealth: Payer: Self-pay | Admitting: *Deleted

## 2022-08-15 DIAGNOSIS — Z79899 Other long term (current) drug therapy: Secondary | ICD-10-CM

## 2022-08-15 NOTE — Telephone Encounter (Signed)
CBC and CMP.

## 2022-08-15 NOTE — Telephone Encounter (Signed)
Patient contacted the office and asked about having her labs done prior to her visit. The only labs under future orders are to check liver functions. Would you like patient to have any other labs prior to her next appointment ?

## 2022-08-15 NOTE — Addendum Note (Signed)
Addended by: Carole Binning on: 08/15/2022 04:02 PM   Modules accepted: Orders

## 2022-08-15 NOTE — Telephone Encounter (Signed)
Patient advised lab orders have been released.  

## 2022-08-16 DIAGNOSIS — Z79899 Other long term (current) drug therapy: Secondary | ICD-10-CM | POA: Diagnosis not present

## 2022-08-17 LAB — CBC WITH DIFFERENTIAL/PLATELET
Absolute Monocytes: 575 cells/uL (ref 200–950)
Basophils Absolute: 73 cells/uL (ref 0–200)
Basophils Relative: 0.9 %
Eosinophils Absolute: 300 cells/uL (ref 15–500)
Eosinophils Relative: 3.7 %
HCT: 42.9 % (ref 35.0–45.0)
Hemoglobin: 14.6 g/dL (ref 11.7–15.5)
Lymphs Abs: 2300 cells/uL (ref 850–3900)
MCH: 31.2 pg (ref 27.0–33.0)
MCHC: 34 g/dL (ref 32.0–36.0)
MCV: 91.7 fL (ref 80.0–100.0)
MPV: 11.2 fL (ref 7.5–12.5)
Monocytes Relative: 7.1 %
Neutro Abs: 4852 cells/uL (ref 1500–7800)
Neutrophils Relative %: 59.9 %
Platelets: 342 10*3/uL (ref 140–400)
RBC: 4.68 10*6/uL (ref 3.80–5.10)
RDW: 13 % (ref 11.0–15.0)
Total Lymphocyte: 28.4 %
WBC: 8.1 10*3/uL (ref 3.8–10.8)

## 2022-08-17 LAB — COMPLETE METABOLIC PANEL WITH GFR
AG Ratio: 1.4 (calc) (ref 1.0–2.5)
ALT: 102 U/L — ABNORMAL HIGH (ref 6–29)
AST: 73 U/L — ABNORMAL HIGH (ref 10–35)
Albumin: 4.3 g/dL (ref 3.6–5.1)
Alkaline phosphatase (APISO): 65 U/L (ref 37–153)
BUN: 11 mg/dL (ref 7–25)
CO2: 26 mmol/L (ref 20–32)
Calcium: 9.8 mg/dL (ref 8.6–10.4)
Chloride: 101 mmol/L (ref 98–110)
Creat: 0.74 mg/dL (ref 0.60–1.00)
Globulin: 3 g/dL (calc) (ref 1.9–3.7)
Glucose, Bld: 143 mg/dL — ABNORMAL HIGH (ref 65–99)
Potassium: 3.9 mmol/L (ref 3.5–5.3)
Sodium: 140 mmol/L (ref 135–146)
Total Bilirubin: 0.4 mg/dL (ref 0.2–1.2)
Total Protein: 7.3 g/dL (ref 6.1–8.1)
eGFR: 86 mL/min/{1.73_m2} (ref >=60)

## 2022-08-17 NOTE — Progress Notes (Signed)
CBC is normal, glucose is elevated probably not a fasting sample.  AST and ALT are elevated but mildly improved.  Please forward results to her PCP.  If patient would like to see a gastroenterologist we can make a referral for the elevated LFTs.

## 2022-08-23 NOTE — Progress Notes (Signed)
Office Visit Note  Patient: Dorothy Robbins             Date of Birth: 01-19-1951           MRN: [id number]             PCP: Lianne Moris, PA-C Referring: Lianne Moris, PA-C Visit Date: 09/06/2022 Occupation: @GUAROCC @  Subjective:  Pain in multiple joints and psoriasis  History of Present Illness: Dorothy Robbins is a 72 y.o. female with history of psoriatic arthritis, osteoarthritis and psoriasis.  She returns today after her last visit on June 28, 2022.  She was referred to wound care center for the lesion on her right lower extremity.  She had been going to the wound care on a regular basis and has been discharged.  The plan was to start her on Skyrizi but we held due to the wound on her right lower extremity.  She states her rash is getting worse.  She has a rash on her entire trunk, lower extremities and upper extremities.  She has been having pain in her joints involving right shoulder, bilateral hands, knees, feet and her lower back which she describes in the SI joints.  She was evaluated by the PCP for elevated LFTs and had ultrasound of her abdomen which showed normal liver size with diffuse fatty changes throughout the liver parenchyma consistent with fatty liver.    Activities of Daily Living:  Patient reports morning stiffness for 30 minutes.   Patient Reports nocturnal pain.  Difficulty dressing/grooming: Denies Difficulty climbing stairs: Denies Difficulty getting out of chair: Denies Difficulty using hands for taps, buttons, cutlery, and/or writing: Reports  Review of Systems  Constitutional:  Positive for fatigue.  HENT:  Positive for mouth dryness. Negative for mouth sores.   Eyes:  Negative for dryness.  Respiratory:  Negative for shortness of breath.   Cardiovascular:  Negative for chest pain and palpitations.  Gastrointestinal:  Positive for constipation. Negative for blood in stool and diarrhea.  Endocrine: Negative for increased urination.   Genitourinary:  Negative for involuntary urination.  Musculoskeletal:  Positive for joint pain, joint pain, joint swelling, myalgias, morning stiffness, muscle tenderness and myalgias. Negative for gait problem and muscle weakness.  Skin:  Positive for rash. Negative for color change, hair loss and sensitivity to sunlight.  Allergic/Immunologic: Negative for susceptible to infections.  Neurological:  Negative for dizziness and headaches.  Hematological:  Negative for swollen glands.  Psychiatric/Behavioral:  Positive for sleep disturbance. Negative for depressed mood. The patient is not nervous/anxious.     PMFS History:  Patient Active Problem List   Diagnosis Date Noted   Psoriatic arthritis 06/28/2022   Psoriasis 06/28/2022   Elevated LFTs 06/28/2022   Primary osteoarthritis of both knees 06/28/2022   Primary osteoarthritis of both feet 06/28/2022   Lumbar stenosis with neurogenic claudication 11/07/2018   Elevated liver enzymes 11/06/2016   Essential hypertension 05/08/2016   Diabetes 05/08/2016    Past Medical History:  Diagnosis Date   Diabetes 05/08/2016   Elevated liver enzymes 11/06/2016   Essential hypertension 05/08/2016   Squamous cell carcinoma, leg     Family History  Problem Relation Age of Onset   Diabetes Mother    Heart disease Mother    Thyroid disease Mother    Aneurysm Father    Hypertension Father    COPD Sister    Lung cancer Sister    Pancreatic cancer Sister    Past Surgical History:  Procedure Laterality  Date   BACK SURGERY     SKIN CANCER EXCISION  06/07/2022   leg   SURGERY OF LIP     Social History   Social History Narrative   Not on file    There is no immunization history on file for this patient.   Objective: Vital Signs: BP (!) 144/79 (BP Location: Left Arm, Patient Position: Sitting, Cuff Size: Normal)   Pulse 76   Resp 16   Ht 5\' 3"  (1.6 m)   Wt 190 lb 3.2 oz (86.3 kg)   BMI 33.69 kg/m    Physical Exam Vitals and  nursing note reviewed.  Constitutional:      Appearance: She is well-developed.  HENT:     Head: Normocephalic and atraumatic.  Eyes:     Conjunctiva/sclera: Conjunctivae normal.  Cardiovascular:     Rate and Rhythm: Normal rate and regular rhythm.     Heart sounds: Normal heart sounds.  Pulmonary:     Effort: Pulmonary effort is normal.     Breath sounds: Normal breath sounds.  Abdominal:     General: Bowel sounds are normal.     Palpations: Abdomen is soft.  Musculoskeletal:     Cervical back: Normal range of motion.  Lymphadenopathy:     Cervical: No cervical adenopathy.  Skin:    General: Skin is warm and dry.     Capillary Refill: Capillary refill takes less than 2 seconds.     Comments: Right lower extremity ulcer has healed with a scab formation.  Neurological:     Mental Status: She is alert and oriented to person, place, and time.  Psychiatric:        Behavior: Behavior normal.      Musculoskeletal Exam: Cervical spine was in good range of motion.  Thoracic and lumbar spine were in good range of motion.  Tenderness over bilateral SI joints was noted.  She had good range of motion of bilateral shoulder joints with some discomfort in the right shoulder joint.  Wrist joints, MCPs PIPs and DIPs were in good range of motion.  Bilateral CMC PIP and DIP thickening was noted.  Hip joints and knee joints in good range of motion without any warmth swelling or effusion.  There was no tenderness over ankles or MTPs.    CDAI Exam: CDAI Score: -- Patient Global: --; Provider Global: -- Swollen: --; Tender: -- Joint Exam 09/06/2022   No joint exam has been documented for this visit   There is currently no information documented on the homunculus. Go to the Rheumatology activity and complete the homunculus joint exam.  Investigation: No additional findings.  Imaging: No results found.  Recent Labs: Lab Results  Component Value Date   WBC 8.1 08/16/2022   HGB 14.6  08/16/2022   PLT 342 08/16/2022   NA 140 08/16/2022   K 3.9 08/16/2022   CL 101 08/16/2022   CO2 26 08/16/2022   GLUCOSE 143 (H) 08/16/2022   BUN 11 08/16/2022   CREATININE 0.74 08/16/2022   BILITOT 0.4 08/16/2022   ALKPHOS 52 11/04/2018   AST 73 (H) 08/16/2022   ALT 102 (H) 08/16/2022   PROT 7.3 08/16/2022   ALBUMIN 4.4 11/04/2018   CALCIUM 9.8 08/16/2022   GFRAA >60 11/07/2018   QFTBGOLDPLUS NEGATIVE 06/06/2022    Speciality Comments: No specialty comments available.  Procedures:  No procedures performed Allergies: Sulfa antibiotics   Assessment / Plan:     Visit Diagnoses: Psoriatic arthritis - History of recurrent  swelling, sacroiliitis, trochanteric bursitis.  She continues to have pain and discomfort in multiple joints with intermittent swelling.  However she had tenderness over bilateral SI joints and trochanteric bursa today.  No synovitis was noted.  Psoriasis - Extensive psoriasis over the trunk and extremities.  She is followed by Dr. Emily Filbert.  She uses topical agents which are not effective.  High risk medication use - We discussed the option of for [facility] after the ulcer healed.  Patient was evaluated at wound care and was treated for almost 2 months.  The wound on her right lower extremity is healed.  She will receive her first Skyrizi dose today.  Will check labs in a month and then every 3 months to monitor for drug toxicity.  TB Gold was negative on June 06, 2022.  History of ulcer of lower extremity - Ulceration was noted on the right lower extremity at the last visit and she was referred to wound clinic.  She has been discharged from the wound clinic.  The wound has healed well and she has a scab formation.  Elevated LFTs - Due to NSAID use per patient.  Patient brought the report of abdominal ultrasound which was consistent with fatty liver.  Fatty liver - September 05, 2022 ultrasound examination showed fatty liver.  Primary osteoarthritis of both hands-she  had bilateral PIP DIP thickening with no synovitis.  Chronic pain of both hips -she continues to have discomfort in her hips most likely due to trochanteric bursitis.  X-rays were unremarkable.  Primary osteoarthritis of both knees - Mild osteoarthritis and moderate chondromalacia patella was noted.  Primary osteoarthritis of both feet-she continues to have pain and discomfort in her bilateral feet.  Chronic SI joint pain - Osteoarthritic changes were noted.  She had bilateral SI joint tenderness and discomfort.  Lumbar stenosis with neurogenic claudication - S/p fusion by Dr. Conchita Paris.  Myalgia-she has intermittent myalgias.  Essential hypertension-blood pressure was elevated at 144/79.  Repeat blood pressure was also elevated.  She was advised to monitor blood pressure closely and follow-up with the PCP.  History of diabetes mellitus  Cancer, skin, squamous cell-right lower extremity  Family history of systemic lupus erythematosus-paternal aunt and grand niece  Orders: No orders of the defined types were placed in this encounter.  No orders of the defined types were placed in this encounter.    Follow-Up Instructions: Return in about 5 months (around 02/06/2023) for Psoriatic arthritis.   Pollyann Savoy, MD  Note - This record has been created using Animal nutritionist.  Chart creation errors have been sought, but may not always  have been located. Such creation errors do not reflect on  the standard of medical care.

## 2022-09-03 ENCOUNTER — Telehealth: Payer: Self-pay | Admitting: Rheumatology

## 2022-09-03 DIAGNOSIS — L4050 Arthropathic psoriasis, unspecified: Secondary | ICD-10-CM | POA: Diagnosis not present

## 2022-09-03 DIAGNOSIS — Z6833 Body mass index (BMI) 33.0-33.9, adult: Secondary | ICD-10-CM | POA: Diagnosis not present

## 2022-09-03 DIAGNOSIS — R748 Abnormal levels of other serum enzymes: Secondary | ICD-10-CM | POA: Diagnosis not present

## 2022-09-03 NOTE — Telephone Encounter (Signed)
Patient called the office stating she has an appointment on Thursday with Dr. Corliss Skains. Patient states she was supposed to have started taking Skyrizi but its been delayed multiple times due to elevated liver values and a skin cancer removal. Patient would like to know if this appointment needs to be kept. Please advised.

## 2022-09-04 NOTE — Telephone Encounter (Signed)
Please check if patient has seen a gastroenterologist for elevated LFTs.  We need to get an explanation why her LFTs are elevated from her gastroenterologist.  If she has healed from the skin cancer removal then she should be able to start a Skyrizi.

## 2022-09-04 NOTE — Telephone Encounter (Signed)
Returned call to patient and patient states she has seen a gastroenterologist in the past but it was several years ago. Patient states she saw her PCP yesterday and has been scheduled for an ultrasound of her lover, gallbladder and pancreas tomorrow. She will have PCP forward the results to our office for our records. Patient advised we need to get an explanation why her LFTs are elevated from her gastroenterologist and she may need to make an appointment with them. Patient advised if she has healed from the skin cancer removal then she should be able to start a Skyrizi. Patient has a follow up appointment on 09/06/2022.

## 2022-09-05 DIAGNOSIS — R748 Abnormal levels of other serum enzymes: Secondary | ICD-10-CM | POA: Diagnosis not present

## 2022-09-06 ENCOUNTER — Ambulatory Visit: Payer: Medicare Other | Attending: Rheumatology | Admitting: Rheumatology

## 2022-09-06 ENCOUNTER — Encounter: Payer: Self-pay | Admitting: Rheumatology

## 2022-09-06 VITALS — BP 144/79 | HR 76 | Resp 16 | Ht 63.0 in | Wt 190.2 lb

## 2022-09-06 DIAGNOSIS — Z8639 Personal history of other endocrine, nutritional and metabolic disease: Secondary | ICD-10-CM

## 2022-09-06 DIAGNOSIS — Z79899 Other long term (current) drug therapy: Secondary | ICD-10-CM

## 2022-09-06 DIAGNOSIS — Z872 Personal history of diseases of the skin and subcutaneous tissue: Secondary | ICD-10-CM

## 2022-09-06 DIAGNOSIS — L4050 Arthropathic psoriasis, unspecified: Secondary | ICD-10-CM

## 2022-09-06 DIAGNOSIS — I10 Essential (primary) hypertension: Secondary | ICD-10-CM

## 2022-09-06 DIAGNOSIS — M19042 Primary osteoarthritis, left hand: Secondary | ICD-10-CM

## 2022-09-06 DIAGNOSIS — Z7189 Other specified counseling: Secondary | ICD-10-CM

## 2022-09-06 DIAGNOSIS — M19041 Primary osteoarthritis, right hand: Secondary | ICD-10-CM

## 2022-09-06 DIAGNOSIS — C4492 Squamous cell carcinoma of skin, unspecified: Secondary | ICD-10-CM

## 2022-09-06 DIAGNOSIS — M533 Sacrococcygeal disorders, not elsewhere classified: Secondary | ICD-10-CM

## 2022-09-06 DIAGNOSIS — M791 Myalgia, unspecified site: Secondary | ICD-10-CM

## 2022-09-06 DIAGNOSIS — L409 Psoriasis, unspecified: Secondary | ICD-10-CM

## 2022-09-06 DIAGNOSIS — M19071 Primary osteoarthritis, right ankle and foot: Secondary | ICD-10-CM

## 2022-09-06 DIAGNOSIS — M25552 Pain in left hip: Secondary | ICD-10-CM

## 2022-09-06 DIAGNOSIS — G8929 Other chronic pain: Secondary | ICD-10-CM

## 2022-09-06 DIAGNOSIS — M19072 Primary osteoarthritis, left ankle and foot: Secondary | ICD-10-CM

## 2022-09-06 DIAGNOSIS — M25551 Pain in right hip: Secondary | ICD-10-CM

## 2022-09-06 DIAGNOSIS — M170 Bilateral primary osteoarthritis of knee: Secondary | ICD-10-CM

## 2022-09-06 DIAGNOSIS — R7989 Other specified abnormal findings of blood chemistry: Secondary | ICD-10-CM

## 2022-09-06 DIAGNOSIS — M48062 Spinal stenosis, lumbar region with neurogenic claudication: Secondary | ICD-10-CM

## 2022-09-06 DIAGNOSIS — Z8269 Family history of other diseases of the musculoskeletal system and connective tissue: Secondary | ICD-10-CM

## 2022-09-06 DIAGNOSIS — K760 Fatty (change of) liver, not elsewhere classified: Secondary | ICD-10-CM

## 2022-09-06 MED ORDER — SKYRIZI PEN 150 MG/ML ~~LOC~~ SOAJ: SUBCUTANEOUS | 0 refills | Status: DC

## 2022-09-06 NOTE — Patient Instructions (Signed)
Your next SKYRIZI dose is due on 10/04/22 then every 12 weeks thereafter (starting 12/27/22)  HOLD SKYRIZI if you have signs or symptoms of an infection. You can resume once you feel better or back to your baseline. HOLD SKYRIZI if you start antibiotics to treat an infection. HOLD SKYRIZI around the time of surgery/procedures. Your surgeon will be able to provide recommendations on when to hold BEFORE and when you are cleared to RESUME.  Pharmacy information: Your prescription will be shipped from [facility]. Their phone number is [phone] Please call to schedule shipment and confirm address. They will mail your medication to your home.  Labs are due in 1 month then every 3 months. Lab hours are from Monday to Thursday 8am-12:30pm and 1pm-5pm and Friday 8am-12pm. You do not need an appointment if you come for labs during these times.  How to manage an injection site reaction: Remember the 5 C's: COUNTER - leave on the counter at least 30 minutes but up to overnight to bring medication to room temperature. This may help prevent stinging COLD - place something cold (like an ice gel pack or cold water bottle) on the injection site just before cleansing with alcohol. This may help reduce pain CLARITIN - use Claritin (generic name is loratadine) for the first two weeks of treatment or the day of, the day before, and the day after injecting. This will help to minimize injection site reactions CORTISONE CREAM - apply if injection site is irritated and itching CALL ME - if injection site reaction is bigger than the size of your fist, looks infected, blisters, or if you develop hives      Standing Labs We placed an order today for your standing lab work.   Please have your standing labs drawn in 1 month and then every 3 months.  Please have your labs drawn 2 weeks prior to your appointment so that the provider can discuss your lab results at your appointment, if possible.  Please note  that you may see your imaging and lab results in MyChart before we have reviewed them. We will contact you once all results are reviewed. Please allow our office up to 72 hours to thoroughly review all of the results before contacting the office for clarification of your results.  WALK-IN LAB HOURS  Monday through Thursday from 8:00 am -12:30 pm and 1:00 pm-5:00 pm and Friday from 8:00 am-12:00 pm.  Patients with office visits requiring labs will be seen before walk-in labs.  You may encounter longer than normal wait times. Please allow additional time. Wait times may be shorter on  Monday and Thursday afternoons.  We do not book appointments for walk-in labs. We appreciate your patience and understanding with our staff.   Labs are drawn by [facility]. Please bring your co-pay at the time of your lab draw.  You may receive a bill from [facility] for your lab work.  Please note if you are on Hydroxychloroquine and and an order has been placed for a Hydroxychloroquine level,  you will need to have it drawn 4 hours or more after your last dose.  If you wish to have your labs drawn at another location, please call the office 24 hours in advance so we can fax the orders.  The office is located at [address], [place], [place] [address]   If you have any questions regarding directions or hours of operation,  please call [phone].   As a reminder, please drink plenty  of water prior to coming for your lab work. Thanks!   Vaccines You are taking a medication(s) that can suppress your immune system.  The following immunizations are recommended: Flu annually Covid-19  Td/Tdap (tetanus, diphtheria, pertussis) every 10 years Pneumonia (Prevnar 15 then Pneumovax 23 at least 1 year apart.  Alternatively, can take Prevnar 20 without needing additional dose) Shingrix: 2 doses from 4 weeks to 6 months apart  Please check with your PCP to make sure you are up to date.   If you have signs or  symptoms of an infection or start antibiotics: First, call your PCP for workup of your infection. Hold your medication through the infection, until you complete your antibiotics, and until symptoms resolve if you take the following: Injectable medication (Actemra, Benlysta, Cimzia, Cosentyx, Enbrel, Humira, Kevzara, Orencia, Remicade, Simponi, Stelara, Taltz, Tremfya) Methotrexate Leflunomide (Arava) Mycophenolate (Cellcept) Harriette Ohara, Olumiant, or Rinvoq

## 2022-09-06 NOTE — Progress Notes (Signed)
Pharmacy Note  Subjective:   Patient presents to clinic today to receive first dose of Skyrizi for psoriatic arthritis and psoriasis.  She does self-adminsiter Ozempic so feels comfortable with injections generally  Patient running a fever or have signs/symptoms of infection? No  Patient currently on antibiotics for the treatment of infection? No  Patient have any upcoming invasive procedures/surgeries? No  Objective: CMP     Component Value Date/Time   NA 140 08/16/2022 0828   K 3.9 08/16/2022 0828   CL 101 08/16/2022 0828   CO2 26 08/16/2022 0828   GLUCOSE 143 (H) 08/16/2022 0828   BUN 11 08/16/2022 0828   CREATININE 0.74 08/16/2022 0828   CALCIUM 9.8 08/16/2022 0828   PROT 7.3 08/16/2022 0828   ALBUMIN 4.4 11/04/2018 1044   AST 73 (H) 08/16/2022 0828   ALT 102 (H) 08/16/2022 0828   ALKPHOS 52 11/04/2018 1044   BILITOT 0.4 08/16/2022 0828   GFRNONAA >60 11/07/2018 1405   GFRAA >60 11/07/2018 1405    CBC    Component Value Date/Time   WBC 8.1 08/16/2022 0828   RBC 4.68 08/16/2022 0828   HGB 14.6 08/16/2022 0828   HCT 42.9 08/16/2022 0828   PLT 342 08/16/2022 0828   MCV 91.7 08/16/2022 0828   MCH 31.2 08/16/2022 0828   MCHC 34.0 08/16/2022 0828   RDW 13.0 08/16/2022 0828   LYMPHSABS 2,300 08/16/2022 0828   EOSABS 300 08/16/2022 0828   BASOSABS 73 08/16/2022 0828    Baseline Immunosuppressant Therapy Labs TB GOLD    Latest Ref Rng & Units 06/06/2022   11:35 AM  Quantiferon TB Gold  Quantiferon TB Gold Plus NEGATIVE NEGATIVE    Hepatitis Panel    Latest Ref Rng & Units 06/06/2022   11:35 AM  Hepatitis  Hep B Surface Ag NON-REACTIVE NON-REACTIVE   Hep B IgM NON-REACTIVE NON-REACTIVE   Hep C Ab NON-REACTIVE NON-REACTIVE    HIV No results found for: "HIV" Immunoglobulins    Latest Ref Rng & Units 06/06/2022   11:35 AM  Immunoglobulin Electrophoresis  IgA  70 - 320 mg/dL 161   IgG 096 - 0,454 mg/dL 0,981   IgM 50 - 191 mg/dL 478    SPEP    Latest  Ref Rng & Units 08/16/2022    8:28 AM  Serum Protein Electrophoresis  Total Protein 6.1 - 8.1 g/dL 7.3    Assessment/Plan:  Reviewed importance of holding SKYRIZI with signs/symptoms of an infections, if antibiotics are prescribed to treat an active infection, and with invasive procedures  Demonstrated proper injection technique with SKYRIZI demo device  Patient able to demonstrate proper injection technique using the teach back method.  Patient self injected in the right lower abdomen with:  Sample Medication: Skyrizi /mL autoinjector NDC: [address]-65 Lot: [id number] Expiration: 01/2023  Patient tolerated well.  Observed for 30 mins in office for adverse reaction and none noted. Patient denies itchiness and irritation. No swelling or redness noted.   Patient is to return in 1 month for labs and 6-8 weeks for follow-up appointment.  Standing orders for CBC/CMP placed.  TB gold will be monitored yearly.   SKYRIZI approved through patient assistance .   Rx sent to: [facility] for Humira/Rinvoq/Skyrizi: [phone].  Patient provided with pharmacy phone number. She has   Patient will continue Skyrizi  subcut at Week 0 (administered in clinic today), Week 4 (10/04/22), then every 12 weeks thereafter (starting on 12/27/22)  All questions encouraged and answered.  Instructed patient  to call with any further questions or concerns.  Chesley Mires, PharmD, MPH, BCPS, CPP Clinical Pharmacist (Rheumatology and Pulmonology)  09/06/2022 10:12 AM

## 2022-09-07 DIAGNOSIS — J0101 Acute recurrent maxillary sinusitis: Secondary | ICD-10-CM | POA: Diagnosis not present

## 2022-09-07 DIAGNOSIS — Z6833 Body mass index (BMI) 33.0-33.9, adult: Secondary | ICD-10-CM | POA: Diagnosis not present

## 2022-09-07 DIAGNOSIS — H109 Unspecified conjunctivitis: Secondary | ICD-10-CM | POA: Diagnosis not present

## 2022-10-04 DIAGNOSIS — L570 Actinic keratosis: Secondary | ICD-10-CM | POA: Diagnosis not present

## 2022-10-04 DIAGNOSIS — L409 Psoriasis, unspecified: Secondary | ICD-10-CM | POA: Diagnosis not present

## 2022-10-04 DIAGNOSIS — D485 Neoplasm of uncertain behavior of skin: Secondary | ICD-10-CM | POA: Diagnosis not present

## 2022-10-04 DIAGNOSIS — D225 Melanocytic nevi of trunk: Secondary | ICD-10-CM | POA: Diagnosis not present

## 2022-10-04 DIAGNOSIS — D0462 Carcinoma in situ of skin of left upper limb, including shoulder: Secondary | ICD-10-CM | POA: Diagnosis not present

## 2022-10-04 DIAGNOSIS — L578 Other skin changes due to chronic exposure to nonionizing radiation: Secondary | ICD-10-CM | POA: Diagnosis not present

## 2022-10-04 NOTE — Progress Notes (Unsigned)
Office Visit Note  Patient: Dorothy Robbins             Date of Birth: April 02, 1951           MRN: [id number]             PCP: Lianne Moris, PA-C Referring: Lianne Moris, PA-C Visit Date: 10/18/2022 Occupation: @GUAROCC @  Subjective:  Medication monitoring   History of Present Illness: Dorothy Robbins is a 72 y.o. female with history of psoriatic arthritis.  Patient was started on Skyrizi in the office on 09/06/2022.  She has been tolerating Skyrizi without any side effects or injection site reactions.  She had her second dose of Skyrizi administered on 10/04/2022.  She has not yet noticed any clinical improvement since initiating therapy.  She continues to have scattered patches of psoriasis and has been using topical agents as instructed.  She has also noticed a few new patches of psoriasis on her torso.  She has ongoing pain in her right hip joint,, left knee joint, and both feet.  Her pain is typically most severe at night in her feet and right hip.  She denies any Achilles tendinitis or plantar fasciitis.  She denies any SI joint pain.  She denies any recent or recurrent infections since initiating Skyrizi.  She states the ulcer on her right lower extremity has completely healed with no recurrence.   Activities of Daily Living:  Patient reports morning stiffness for 15 minutes.   Patient Reports nocturnal pain.  Difficulty dressing/grooming: Denies Difficulty climbing stairs: Denies Difficulty getting out of chair: Denies Difficulty using hands for taps, buttons, cutlery, and/or writing: Reports  Review of Systems  Constitutional:  Positive for fatigue.  HENT:  Positive for mouth dryness. Negative for mouth sores.   Eyes:  Negative for dryness.  Respiratory:  Negative for shortness of breath.   Cardiovascular:  Negative for chest pain and palpitations.  Gastrointestinal:  Positive for constipation. Negative for blood in stool and diarrhea.  Endocrine: Negative for increased  urination.  Genitourinary:  Negative for involuntary urination.  Musculoskeletal:  Positive for joint pain, joint pain, myalgias, morning stiffness and myalgias. Negative for gait problem, joint swelling, muscle weakness and muscle tenderness.  Skin:  Positive for rash. Negative for color change, hair loss and sensitivity to sunlight.  Allergic/Immunologic: Negative for susceptible to infections.  Neurological:  Negative for dizziness and headaches.  Hematological:  Negative for swollen glands.  Psychiatric/Behavioral:  Positive for sleep disturbance. Negative for depressed mood. The patient is not nervous/anxious.     PMFS History:  Patient Active Problem List   Diagnosis Date Noted   Psoriatic arthritis (HCC) 06/28/2022   Psoriasis 06/28/2022   Elevated LFTs 06/28/2022   Primary osteoarthritis of both knees 06/28/2022   Primary osteoarthritis of both feet 06/28/2022   Lumbar stenosis with neurogenic claudication 11/07/2018   Elevated liver enzymes 11/06/2016   Essential hypertension 05/08/2016   Diabetes (HCC) 05/08/2016    Past Medical History:  Diagnosis Date   Diabetes (HCC) 05/08/2016   Elevated liver enzymes 11/06/2016   Essential hypertension 05/08/2016   Squamous cell carcinoma, leg     Family History  Problem Relation Age of Onset   Diabetes Mother    Heart disease Mother    Thyroid disease Mother    Aneurysm Father    Hypertension Father    COPD Sister    Lung cancer Sister    Pancreatic cancer Sister    Past Surgical History:  Procedure  Laterality Date   BACK SURGERY     SKIN CANCER EXCISION  06/07/2022   leg   SURGERY OF LIP     Social History   Social History Narrative   Not on file    There is no immunization history on file for this patient.   Objective: Vital Signs: BP 136/73 (BP Location: Left Arm, Patient Position: Sitting, Cuff Size: Normal)   Pulse 89   Resp 14   Ht 5\' 3"  (1.6 m)   Wt 188 lb (85.3 kg)   BMI 33.30 kg/m    Physical  Exam Vitals and nursing note reviewed.  Constitutional:      Appearance: She is well-developed.  HENT:     Head: Normocephalic and atraumatic.  Eyes:     Conjunctiva/sclera: Conjunctivae normal.  Cardiovascular:     Rate and Rhythm: Normal rate and regular rhythm.     Heart sounds: Normal heart sounds.  Pulmonary:     Effort: Pulmonary effort is normal.     Breath sounds: Normal breath sounds.  Abdominal:     General: Bowel sounds are normal.     Palpations: Abdomen is soft.  Musculoskeletal:     Cervical back: Normal range of motion.  Lymphadenopathy:     Cervical: No cervical adenopathy.  Skin:    General: Skin is warm and dry.     Capillary Refill: Capillary refill takes less than 2 seconds.  Neurological:     Mental Status: She is alert and oriented to person, place, and time.  Psychiatric:        Behavior: Behavior normal.      Musculoskeletal Exam: C-spine has good range of motion.  No midline spinal tenderness.  No SI joint tenderness.  Shoulder joints, elbow joints, wrist joints, MCPs, PIPs, DIPs have good range of motion with no synovitis.  CMC, PIP, DIP thickening consistent with osteoarthritis of both hands.  Hip joints have good range of motion with some discomfort in the right hip.  Knee joints have good range of motion with a small effusion in the left knee.  Ankle joints have good range of motion with no tenderness or joint swelling.  No evidence of Achilles tendinitis or plantar fasciitis.  CDAI Exam: CDAI Score: -- Patient Global: --; Provider Global: -- Swollen: --; Tender: -- Joint Exam 10/18/2022   No joint exam has been documented for this visit   There is currently no information documented on the homunculus. Go to the Rheumatology activity and complete the homunculus joint exam.  Investigation: No additional findings.  Imaging: No results found.  Recent Labs: Lab Results  Component Value Date   WBC 7.0 10/08/2022   HGB 14.2 10/08/2022   PLT  348 10/08/2022   NA 138 10/08/2022   K 3.9 10/08/2022   CL 98 10/08/2022   CO2 30 10/08/2022   GLUCOSE 210 (H) 10/08/2022   BUN 10 10/08/2022   CREATININE 0.76 10/08/2022   BILITOT 0.5 10/08/2022   ALKPHOS 52 11/04/2018   AST 93 (H) 10/08/2022   ALT 131 (H) 10/08/2022   PROT 7.0 10/08/2022   ALBUMIN 4.4 11/04/2018   CALCIUM 9.4 10/08/2022   GFRAA >60 11/07/2018   QFTBGOLDPLUS NEGATIVE 06/06/2022    Speciality Comments: No specialty comments available.  Procedures:  No procedures performed Allergies: Sulfa antibiotics   Assessment / Plan:     Visit Diagnoses: Psoriatic arthritis (HCC) - History of recurrent swelling, sacroiliitis, trochanteric bursitis: She has no SI joint tenderness upon palpation today.  No evidence of Achilles tendinitis or plantar fasciitis.  She has had ongoing pain in the right hip and the left knee joint.  Mild inflammation was noted in the left knee today.  Patient was initiated on Skyrizi on 09/06/2022.  She has been tolerating Skyrizi without any side effects or injection site reactions.  She has not yet noticed any clinical improvement since initiating Skyrizi.  She is willing to give Cristy Folks more time and for [place] to reassess for the full efficacy at her follow-up visit.  She will follow-up in the office in 3 months or sooner if needed.  Psoriasis: Extensive psoriasis over the trunk and extremities.  She is followed by Dr. Emily Filbert.  Inadequate response to topical agents.  Patient was started on Skyrizi on 09/06/2022.  She has not yet noticed any clinical improvement since initiating Skyrizi.  She is actually noticed new patches of psoriasis since the initiation.  She is willing to give Cristy Folks more time and for [place] to reassess for the full efficacy at her follow-up visit.  She plans on continuing to use topical agents as needed.  High risk medication use - Initiated Skyrizi on 09/06/2022. CBC and CMP updated on 10/08/22.  Her next lab work will be due in August and  every 3 months to monitor for drug toxicity. TB gold negative on 06/06/22.  Discussed the importance of holding Skyrizi if she develops signs or symptoms of an infection and to resume once the infection has completely cleared.  Elevated LFTs - Due to NSAID use per patient.  Patient brought the report of abdominal ultrasound which was consistent with fatty liver.  Fatty liver: September 05, 2022 ultrasound examination showed fatty liver. AST and ALT remain elevated on 09/20/2022.  She has an upcoming appointment scheduled with GI on 12/10/2022 for further evaluation.  History of ulcer of lower extremity: Healed with no recurrence.  Primary osteoarthritis of both hands: PIP and DIP thickening consistent with osteoarthritis of both hands.  No synovitis or dactylitis noted.  Complete fist formation bilaterally.  Chronic pain of both hips: Patient presents today with ongoing pain in the right hip joint.  On examination she has good range of motion with some stiffness in the right hip.  Some tenderness over the right hip flexor.  No tenderness over the trochanteric bursa at this time.  Primary osteoarthritis of both knees - Mild osteoarthritis and moderate chondromalacia patella.  Good range of motion of both knee joints on examination today.  Small effusion noted in the left knee.  Primary osteoarthritis of both feet: She has PIP and DIP thickening consistent with osteoarthritis of both hands.  Both ankle joints have good range of motion with no tenderness or synovitis.  No evidence of Achilles tendinitis or plantar fasciitis.  She experiences ongoing pain in both feet especially at night.  Discussed the importance of wearing proper fitting shoes.  Chronic SI joint pain: No tenderness over the SI joints currently.  Lumbar stenosis with neurogenic claudication - S/p fusion by Dr. Conchita Paris.  Other medical conditions are listed as follows:  Essential hypertension: Blood pressure was 136/73 today in the  office.  History of diabetes mellitus  Cancer, skin, squamous cell-right lower extremity  Family history of systemic lupus erythematosus-paternal aunt and grand niece  Orders: No orders of the defined types were placed in this encounter.  No orders of the defined types were placed in this encounter.    Follow-Up Instructions: Return in about 3 months (around 01/18/2023) for  Psoriatic arthritis.   Gearldine Bienenstock, PA-C  Note - This record has been created using Dragon software.  Chart creation errors have been sought, but may not always  have been located. Such creation errors do not reflect on  the standard of medical care.

## 2022-10-05 ENCOUNTER — Telehealth: Payer: Self-pay | Admitting: Rheumatology

## 2022-10-05 ENCOUNTER — Other Ambulatory Visit: Payer: Self-pay | Admitting: *Deleted

## 2022-10-05 DIAGNOSIS — Z79899 Other long term (current) drug therapy: Secondary | ICD-10-CM

## 2022-10-05 DIAGNOSIS — L409 Psoriasis, unspecified: Secondary | ICD-10-CM

## 2022-10-05 DIAGNOSIS — L4050 Arthropathic psoriasis, unspecified: Secondary | ICD-10-CM

## 2022-10-05 NOTE — Telephone Encounter (Signed)
Patient left a voicemail stating she took her 2nd Skyrizi injection yesterday, 10/04/22 and requested a return call to let her know when she is due for labwork.

## 2022-10-05 NOTE — Telephone Encounter (Signed)
Spoke with patient and advised she is due to have her labs drawn now as it has been 1 month since she started the Learned.

## 2022-10-08 DIAGNOSIS — L4050 Arthropathic psoriasis, unspecified: Secondary | ICD-10-CM | POA: Diagnosis not present

## 2022-10-08 DIAGNOSIS — Z79899 Other long term (current) drug therapy: Secondary | ICD-10-CM | POA: Diagnosis not present

## 2022-10-08 DIAGNOSIS — L409 Psoriasis, unspecified: Secondary | ICD-10-CM | POA: Diagnosis not present

## 2022-10-08 LAB — COMPLETE METABOLIC PANEL WITH GFR
AG Ratio: 1.7 (calc) (ref 1.0–2.5)
ALT: 131 U/L — ABNORMAL HIGH (ref 6–29)
AST: 93 U/L — ABNORMAL HIGH (ref 10–35)
Albumin: 4.4 g/dL (ref 3.6–5.1)
Alkaline phosphatase (APISO): 65 U/L (ref 37–153)
BUN: 10 mg/dL (ref 7–25)
CO2: 30 mmol/L (ref 20–32)
Calcium: 9.4 mg/dL (ref 8.6–10.4)
Chloride: 98 mmol/L (ref 98–110)
Creat: 0.76 mg/dL (ref 0.60–1.00)
Globulin: 2.6 g/dL (calc) (ref 1.9–3.7)
Glucose, Bld: 210 mg/dL — ABNORMAL HIGH (ref 65–139)
Potassium: 3.9 mmol/L (ref 3.5–5.3)
Sodium: 138 mmol/L (ref 135–146)
Total Bilirubin: 0.5 mg/dL (ref 0.2–1.2)
Total Protein: 7 g/dL (ref 6.1–8.1)
eGFR: 84 mL/min/{1.73_m2} (ref >=60)

## 2022-10-08 LAB — CBC WITH DIFFERENTIAL/PLATELET
Absolute Monocytes: 448 cells/uL (ref 200–950)
Basophils Absolute: 49 cells/uL (ref 0–200)
Basophils Relative: 0.7 %
Eosinophils Absolute: 399 cells/uL (ref 15–500)
Eosinophils Relative: 5.7 %
HCT: 41.6 % (ref 35.0–45.0)
Hemoglobin: 14.2 g/dL (ref 11.7–15.5)
Lymphs Abs: 1946 cells/uL (ref 850–3900)
MCH: 30.9 pg (ref 27.0–33.0)
MCHC: 34.1 g/dL (ref 32.0–36.0)
MCV: 90.6 fL (ref 80.0–100.0)
MPV: 11.9 fL (ref 7.5–12.5)
Monocytes Relative: 6.4 %
Neutro Abs: 4158 cells/uL (ref 1500–7800)
Neutrophils Relative %: 59.4 %
Platelets: 348 10*3/uL (ref 140–400)
RBC: 4.59 10*6/uL (ref 3.80–5.10)
RDW: 12.9 % (ref 11.0–15.0)
Total Lymphocyte: 27.8 %
WBC: 7 10*3/uL (ref 3.8–10.8)

## 2022-10-09 NOTE — Progress Notes (Signed)
BC is normal.  Glucose is elevated.  Liver functions are still elevated and higher than before.  Please forward results to her PCP and gastroenterologist.  She should follow-up with gastroenterologist for the evaluation of LFTs.

## 2022-10-10 ENCOUNTER — Other Ambulatory Visit: Payer: Self-pay | Admitting: *Deleted

## 2022-10-10 DIAGNOSIS — R7989 Other specified abnormal findings of blood chemistry: Secondary | ICD-10-CM

## 2022-10-10 NOTE — Progress Notes (Signed)
Please refer patient to gastroenterology for the evaluation of elevated LFTs.

## 2022-10-11 ENCOUNTER — Encounter (INDEPENDENT_AMBULATORY_CARE_PROVIDER_SITE_OTHER): Payer: Self-pay | Admitting: *Deleted

## 2022-10-18 ENCOUNTER — Ambulatory Visit: Payer: Medicare Other | Attending: Physician Assistant | Admitting: Physician Assistant

## 2022-10-18 ENCOUNTER — Encounter: Payer: Self-pay | Admitting: Physician Assistant

## 2022-10-18 VITALS — BP 136/73 | HR 89 | Resp 14 | Ht 63.0 in | Wt 188.0 lb

## 2022-10-18 DIAGNOSIS — G8929 Other chronic pain: Secondary | ICD-10-CM

## 2022-10-18 DIAGNOSIS — L4050 Arthropathic psoriasis, unspecified: Secondary | ICD-10-CM

## 2022-10-18 DIAGNOSIS — K760 Fatty (change of) liver, not elsewhere classified: Secondary | ICD-10-CM

## 2022-10-18 DIAGNOSIS — R7989 Other specified abnormal findings of blood chemistry: Secondary | ICD-10-CM

## 2022-10-18 DIAGNOSIS — C4492 Squamous cell carcinoma of skin, unspecified: Secondary | ICD-10-CM

## 2022-10-18 DIAGNOSIS — Z8639 Personal history of other endocrine, nutritional and metabolic disease: Secondary | ICD-10-CM

## 2022-10-18 DIAGNOSIS — M19072 Primary osteoarthritis, left ankle and foot: Secondary | ICD-10-CM

## 2022-10-18 DIAGNOSIS — M25552 Pain in left hip: Secondary | ICD-10-CM

## 2022-10-18 DIAGNOSIS — Z8269 Family history of other diseases of the musculoskeletal system and connective tissue: Secondary | ICD-10-CM

## 2022-10-18 DIAGNOSIS — M48062 Spinal stenosis, lumbar region with neurogenic claudication: Secondary | ICD-10-CM

## 2022-10-18 DIAGNOSIS — L409 Psoriasis, unspecified: Secondary | ICD-10-CM | POA: Diagnosis not present

## 2022-10-18 DIAGNOSIS — M19042 Primary osteoarthritis, left hand: Secondary | ICD-10-CM

## 2022-10-18 DIAGNOSIS — M19041 Primary osteoarthritis, right hand: Secondary | ICD-10-CM

## 2022-10-18 DIAGNOSIS — M19071 Primary osteoarthritis, right ankle and foot: Secondary | ICD-10-CM

## 2022-10-18 DIAGNOSIS — M170 Bilateral primary osteoarthritis of knee: Secondary | ICD-10-CM

## 2022-10-18 DIAGNOSIS — Z79899 Other long term (current) drug therapy: Secondary | ICD-10-CM

## 2022-10-18 DIAGNOSIS — M533 Sacrococcygeal disorders, not elsewhere classified: Secondary | ICD-10-CM

## 2022-10-18 DIAGNOSIS — M25551 Pain in right hip: Secondary | ICD-10-CM

## 2022-10-18 DIAGNOSIS — I10 Essential (primary) hypertension: Secondary | ICD-10-CM

## 2022-10-18 DIAGNOSIS — Z872 Personal history of diseases of the skin and subcutaneous tissue: Secondary | ICD-10-CM

## 2022-10-22 DIAGNOSIS — E1165 Type 2 diabetes mellitus with hyperglycemia: Secondary | ICD-10-CM | POA: Diagnosis not present

## 2022-10-22 DIAGNOSIS — R748 Abnormal levels of other serum enzymes: Secondary | ICD-10-CM | POA: Diagnosis not present

## 2022-10-22 DIAGNOSIS — E7801 Familial hypercholesterolemia: Secondary | ICD-10-CM | POA: Diagnosis not present

## 2022-10-22 DIAGNOSIS — I10 Essential (primary) hypertension: Secondary | ICD-10-CM | POA: Diagnosis not present

## 2022-10-22 DIAGNOSIS — E7849 Other hyperlipidemia: Secondary | ICD-10-CM | POA: Diagnosis not present

## 2022-10-24 DIAGNOSIS — K080 Exfoliation of teeth due to systemic causes: Secondary | ICD-10-CM | POA: Diagnosis not present

## 2022-10-29 DIAGNOSIS — I10 Essential (primary) hypertension: Secondary | ICD-10-CM | POA: Diagnosis not present

## 2022-10-29 DIAGNOSIS — E1165 Type 2 diabetes mellitus with hyperglycemia: Secondary | ICD-10-CM | POA: Diagnosis not present

## 2022-10-29 DIAGNOSIS — L4050 Arthropathic psoriasis, unspecified: Secondary | ICD-10-CM | POA: Diagnosis not present

## 2022-10-29 DIAGNOSIS — E7849 Other hyperlipidemia: Secondary | ICD-10-CM | POA: Diagnosis not present

## 2022-10-31 DIAGNOSIS — D0462 Carcinoma in situ of skin of left upper limb, including shoulder: Secondary | ICD-10-CM | POA: Diagnosis not present

## 2022-11-06 ENCOUNTER — Ambulatory Visit (INDEPENDENT_AMBULATORY_CARE_PROVIDER_SITE_OTHER): Payer: Medicare Other | Admitting: Gastroenterology

## 2022-11-06 ENCOUNTER — Encounter (INDEPENDENT_AMBULATORY_CARE_PROVIDER_SITE_OTHER): Payer: Self-pay | Admitting: Gastroenterology

## 2022-11-06 VITALS — BP 126/69 | HR 78 | Temp 98.0°F | Ht 63.0 in | Wt 189.9 lb

## 2022-11-06 DIAGNOSIS — R7989 Other specified abnormal findings of blood chemistry: Secondary | ICD-10-CM | POA: Diagnosis not present

## 2022-11-06 NOTE — Patient Instructions (Signed)
I suspect your elevated LFTs are secondary to known fatty liver and possibly overlying drug induced Will check some further labs to rule out other causes, I will be in touch once I have these for further recommendations

## 2022-11-06 NOTE — Progress Notes (Addendum)
Referring Provider: Lianne Moris, PA-C Primary Care Physician:  Lianne Moris, PA-C Primary GI Physician: new  Chief Complaint  Patient presents with   elevated LFT    Referred for elevated LFT's.    HPI:   Dorothy Robbins is a 72 y.o. female with past medical history of DM, HTN, SCC, psoriatic arthritis   Patient presenting today as a new patient for elevated LFTs   CMP June with AST 50, ALT 107, CMP 10/08/22 with AST 93, ALT 131, previously with AST 73, ALT 102, notably aminos were even more elevated in January with AST 123, ALT 157, Alk phos and T bili WNL, Plt count at that time 348k   Recent Hep C and Hep B testing negative, IgG 1189, IgM 113  [place] of liver in April with diffuse fatty changes  Per chart review, appears patient has had Elevated LFTs on and off for years, diagnosed with fatty liver previously, though more elevated recently. Seen by our team in 2018 for the same.  Present: She states she started [place] in April then got her second dose in May. She will be doing every 3 months here on out. Rheum was concerned about elevated LFTs in setting of Skyrizi. No other medication changes. She does not drink alcohol. Denies any previous drug use or risky behaviors now or in the past. She switches between ibuprofen and tylenol for arthritic pain, when taking tylenol she may take two-four maybe a few times per week. She notes a skin cancer removal (MOHs surgery) to right lower leg, she developed staph infection and was on some antibiotics at that time, this was in January. She notes that LFTs have been consistently elevated for years (PCP does in house labs not in chart review). No red flag symptoms. Patient denies melena, abdominal pain, hematochezia, nausea, vomiting, diarrhea, constipation, dysphagia, odyonophagia, early satiety or weight loss.    NSAID use: as above  Social hx: as above  Fam hx: mother had fatty liver, sister had pancreatic cancer at age 108, no CRC   Last  Colonoscopy:unsure of timing of last, done at [facility]  Last Endoscopy: unsure of timing of last  Recommendations:    Past Medical History:  Diagnosis Date   Diabetes (HCC) 05/08/2016   Elevated liver enzymes 11/06/2016   Essential hypertension 05/08/2016   Squamous cell carcinoma, leg     Past Surgical History:  Procedure Laterality Date   BACK SURGERY     SKIN CANCER EXCISION  06/07/2022   leg   SURGERY OF LIP      Current Outpatient Medications  Medication Sig Dispense Refill   acetaminophen (TYLENOL) 500 MG tablet Take 1,000 mg by mouth every 8 (eight) hours as needed for mild pain or headache.     aspirin 81 MG chewable tablet Chew 81 mg by mouth daily.      calcipotriene (DOVONOX) 0.005 % ointment Apply topically.     Cyanocobalamin (VITAMIN B-12 PO) Take 500 mcg by mouth daily.     diphenhydrAMINE (BENADRYL) 50 MG tablet Take 50 mg by mouth daily.     gabapentin (NEURONTIN) 300 MG capsule Take 300 mg by mouth 3 (three) times daily.     hydrocortisone 2.5 % cream Apply topically daily.     lisinopril-hydrochlorothiazide (PRINZIDE,ZESTORETIC) 20-25 MG tablet Take 1 tablet by mouth at bedtime.      Magnesium 250 MG TABS Take 250 mg by mouth 3 (three) times a week.     metFORMIN (GLUCOPHAGE-XR) 500 MG  24 hr tablet Take 500 mg by mouth daily with breakfast.     Multiple Vitamin (MULTIVITAMIN) tablet Take 1 tablet by mouth daily.     OZEMPIC, 0.25 OR 0.5 MG/DOSE, 2 MG/3ML SOPN Inject 0.5 mg into the skin once a week.     Risankizumab-rzaa (SKYRIZI PEN) 150 MG/ML SOAJ Inject 150mg  into the skin at Week 0, Week 4, then every 12 weeks. 3 mL 0   TURMERIC PO Take 2,250 mg by mouth 3 (three) times a week.     Vitamin D, Cholecalciferol, 10 MCG (400 UNIT) TABS Take 125 mg by mouth daily.     No current facility-administered medications for this visit.    Allergies as of 11/06/2022 - Review Complete 11/06/2022  Allergen Reaction Noted   Sulfa antibiotics Rash 05/08/2016    Family  History  Problem Relation Age of Onset   Diabetes Mother    Heart disease Mother    Thyroid disease Mother    Aneurysm Father    Hypertension Father    COPD Sister    Lung cancer Sister    Pancreatic cancer Sister     Social History   Socioeconomic History   Marital status: Married    Spouse name: Not on file   Number of children: Not on file   Years of education: Not on file   Highest education level: Not on file  Occupational History   Not on file  Tobacco Use   Smoking status: Never    Passive exposure: Past   Smokeless tobacco: Never  Vaping Use   Vaping Use: Never used  Substance and Sexual Activity   Alcohol use: No   Drug use: No   Sexual activity: Not on file  Other Topics Concern   Not on file  Social History Narrative   Not on file   Social Determinants of Health   Financial Resource Strain: Not on file  Food Insecurity: Not on file  Transportation Needs: Not on file  Physical Activity: Not on file  Stress: Not on file  Social Connections: Not on file   Review of systems General: negative for malaise, night sweats, fever, chills, weight loss Neck: Negative for lumps, goiter, pain and significant neck swelling Resp: Negative for cough, wheezing, dyspnea at rest CV: Negative for chest pain, leg swelling, palpitations, orthopnea GI: denies melena, hematochezia, nausea, vomiting, diarrhea, constipation, dysphagia, odyonophagia, early satiety or unintentional weight loss.  MSK: Negative for joint pain or swelling, back pain, and muscle pain. Derm: Negative for itching or rash Psych: Denies depression, anxiety, memory loss, confusion. No homicidal or suicidal ideation.  Heme: Negative for prolonged bleeding, bruising easily, and swollen nodes. Endocrine: Negative for cold or heat intolerance, polyuria, polydipsia and goiter. Neuro: negative for tremor, gait imbalance, syncope and seizures. The remainder of the review of systems is  noncontributory.  Physical Exam: There were no vitals taken for this visit. General:   Alert and oriented. No distress noted. Pleasant and cooperative.  Head:  Normocephalic and atraumatic. Eyes:  Conjuctiva clear without scleral icterus. Mouth:  Oral mucosa pink and moist. Good dentition. No lesions. Heart: Normal rate and rhythm, s1 and s2 heart sounds present.  Lungs: Clear lung sounds in all lobes. Respirations equal and unlabored. Abdomen:  +BS, soft, non-tender and non-distended. No rebound or guarding. No HSM or masses noted. Derm: No palmar erythema or jaundice Msk:  Symmetrical without gross deformities. Normal posture. Extremities:  Without edema. Neurologic:  Alert and  oriented x4 Psych:  Alert and cooperative. Normal mood and affect.  Invalid input(s): "6 MONTHS"   ASSESSMENT: Dorothy Robbins is a 72 y.o. female presenting today for elevated LFTs  Patient with long history of elevated LFTs, fatty liver, though more elevated recently with hepatocellular pattern, Recent US with diffuse fatty changes. Started skyrizi in January and note she was on antibiotic therapies for staph infection just prior to that. Appears LFTs became more elevated around this time.  FIB 4 score is 1.66-fibrosis excluded. She has had IgG and IgM, as well as negative Hep B and C serologies. No history of IV drug use or risky behavior, does not drink alcohol, uses tylenol maybe a couple of times per week but not in excess. Pattern of LFTs suggestive of possible DILI, query DILI in setting of recent antibiotics (unsure which ones) and Skyrizi as Cristy Folks is known to raise ALT levels,  fortunately her LFTs have trended down with last earlier this month. Will check further serologies to rule out other underlying causes of LFTs. Further recommendations to follow pending these results.    PLAN:  Iron, Ceruloplasmin, A1A, ANA, AMA, ASMA  2. Further recommendations to follow   All questions were answered,  patient verbalized understanding and is in agreement with plan as outlined above.    Follow Up: TBD  Annelise Mccoy L. Rayden Scheper, MSN, APRN, AGNP-C Adult-Gerontology Nurse Practitioner [facility]  I have reviewed the note and agree with the APP's assessment as described in this progress note  Elevated liver enzymes likely related to NASH but also may have increased a little due to possible Dili.  Will benefit from implementing a Mediterranean diet and losing weight.  Will check other serologies.  She had elevated LFTs before starting Skyrizi but BPs continue to increase may need to avoid taking this medication.  I do not think the elevation is primarily related to [facility] but if the elevation duplicates the baseline elevated number, may need to avoid this medication.  Katrinka Blazing, MD Gastroenterology and Hepatology [facility]

## 2022-11-07 LAB — MITOCHONDRIAL ANTIBODIES: Mitochondrial Ab: 29.7 Units — ABNORMAL HIGH (ref 0.0–20.0)

## 2022-11-07 LAB — IRON,TIBC AND FERRITIN PANEL
Ferritin: 390 ng/mL — ABNORMAL HIGH (ref 15–150)
Iron Saturation: 37 % (ref 15–55)
Iron: 105 ug/dL (ref 27–139)
Total Iron Binding Capacity: 281 ug/dL (ref 250–450)
UIBC: 176 ug/dL (ref 118–369)

## 2022-11-07 LAB — ANA: Anti Nuclear Antibody (ANA): NEGATIVE

## 2022-11-07 LAB — ALPHA-1-ANTITRYPSIN: A-1 Antitrypsin: 157 mg/dL (ref 101–187)

## 2022-11-07 LAB — CERULOPLASMIN: Ceruloplasmin: 28.2 mg/dL (ref 19.0–39.0)

## 2022-11-07 LAB — ANTI-SMOOTH MUSCLE ANTIBODY, IGG: Smooth Muscle Ab: 15 Units (ref 0–19)

## 2022-11-12 ENCOUNTER — Other Ambulatory Visit (INDEPENDENT_AMBULATORY_CARE_PROVIDER_SITE_OTHER): Payer: Self-pay | Admitting: Gastroenterology

## 2022-11-12 DIAGNOSIS — Z1231 Encounter for screening mammogram for malignant neoplasm of breast: Secondary | ICD-10-CM | POA: Diagnosis not present

## 2022-11-12 DIAGNOSIS — R7989 Other specified abnormal findings of blood chemistry: Secondary | ICD-10-CM

## 2022-11-15 DIAGNOSIS — R7989 Other specified abnormal findings of blood chemistry: Secondary | ICD-10-CM | POA: Diagnosis not present

## 2022-12-04 LAB — HEMOCHROMATOSIS DNA-PCR(C282Y,H63D)

## 2022-12-05 NOTE — Progress Notes (Signed)
[facility] [address], [place] [address]   Clinic Day:  12/06/2022  Referring physician: Raquel James, NP  Patient Care Team: Lianne Moris, PA-C as PCP - General (Family Medicine)   ASSESSMENT & PLAN:   Assessment:  1.  Heterozygous hemochromatosis for H63D: - Patient was seen by GI service for elevated LFTs.  She had elevated LFTs since 2017. - Labs on 11/06/2022: Ferritin 390, percent saturation 37. - Patient started on Skyrizi for psoriatic arthritis, received 2 shots on 08/27/2022 on 09/28/2022. - Ultrasound abdomen (09/05/2022): Fatty liver - Hemochromatosis testing on 11/15/2022: Heterozygosity for H63D mutation. - 11/06/2016: Ferritin 136  2.  Social/family history: - Lives at home with her husband.  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  Non-smoker.  No chemical exposure. - Mother had fatty liver leading to nonalcoholic cirrhosis.  Sister had pancreatic cancer.  No family history of hemochromatosis.  Plan:  1.  Heterozygous hemochromatosis for H63D: - I think the mildly elevated ferritin of 390 is from fatty liver/psoriatic arthritis as ferritin is an acute phase reactant.  Saturations are within normal limits.  Her previous ferritin in 2018 was normal at 136. - Iron overload is extremely rare in people who are heterozygous for H63D mutation.  Ferritin levels are normally moderately increased and heterozygous individuals, above normal but usually below 1000 ng/mL. - Recommend follow-up in 6 months with a ferritin and iron panel. - If ferritin continues to be high, will consider doing MRI of the liver.  If the GI service wants to do further imaging of the liver, they may consider doing MRI.  If there is no sign of tissue iron deposition on MRI, observation with pediatric monitoring of ferritin and iron panel is recommended. - I have counseled her to avoid iron supplements, vitamin C supplements, avoid cooking in cast iron pots and  pans.   Orders Placed This Encounter  Procedures   Iron and TIBC ([facility] ONLY)    Standing Status:   Future    Standing Expiration Date:   12/06/2023   Ferritin    Standing Status:   Future    Standing Expiration Date:   12/06/2023      Mikeal Hawthorne R Teague,acting as a scribe for Doreatha Massed, MD.,have documented all relevant documentation on the behalf of Doreatha Massed, MD,as directed by  Doreatha Massed, MD while in the presence of Doreatha Massed, MD.   I, Doreatha Massed MD, have reviewed the above documentation for accuracy and completeness, and I agree with the above.   Doreatha Massed, MD   7/18/20244:55 PM  CHIEF COMPLAINT/PURPOSE OF CONSULT:   Diagnosis: elevated ferritin  Current Therapy: Observation  HISTORY OF PRESENT ILLNESS:   Dorothy Robbins is a 72 y.o. female presenting to clinic today for evaluation of elevated ferritin and hemachromatosis carrier at the request of Carlan, Jeral Pinch, NP.  Her last Iron, TIBC, and Ferritin Panel from 6/18 found ferritin elevated at 390. She also had elevated mitochondrial antibodies at 29.7 on 6/18. She was tested for Hemochromatosis on 6/27 and was found to be a carrier of the c.187C>G (p.His63Asp) gene.   Today, she states that she is doing well overall. Her appetite level is at 100%. Her energy level is at 50%.  PAST MEDICAL HISTORY:   Past Medical History: Past Medical History:  Diagnosis Date   Diabetes (HCC) 05/08/2016   Elevated liver enzymes 11/06/2016   Essential hypertension 05/08/2016   Squamous cell  carcinoma, leg     Surgical History: Past Surgical History:  Procedure Laterality Date   BACK SURGERY     SKIN CANCER EXCISION  06/07/2022   leg                   6/24- left forearm   SURGERY OF LIP      Social History: Social History   Socioeconomic History   Marital status: Married    Spouse name: Not on file   Number of children: Not on file   Years of  education: Not on file   Highest education level: Not on file  Occupational History   Not on file  Tobacco Use   Smoking status: Never    Passive exposure: Past   Smokeless tobacco: Never  Vaping Use   Vaping status: Never Used  Substance and Sexual Activity   Alcohol use: No   Drug use: No   Sexual activity: Not on file  Other Topics Concern   Not on file  Social History Narrative   Not on file   Social Determinants of Health   Financial Resource Strain: Not on file  Food Insecurity: No Food Insecurity (12/06/2022)   Hunger Vital Sign    Worried About Running Out of Food in the Last Year: Never true    Ran Out of Food in the Last Year: Never true  Transportation Needs: No Transportation Needs (12/06/2022)   PRAPARE - Administrator, Civil Service (Medical): No    Lack of Transportation (Non-Medical): No  Physical Activity: Not on file  Stress: Not on file  Social Connections: Not on file  Intimate Partner Violence: Not At Risk (12/06/2022)   Humiliation, Afraid, Rape, and Kick questionnaire    Fear of Current or Ex-Partner: No    Emotionally Abused: No    Physically Abused: No    Sexually Abused: No    Family History: Family History  Problem Relation Age of Onset   Diabetes Mother    Heart disease Mother    Thyroid disease Mother    Aneurysm Father    Hypertension Father    COPD Sister    Lung cancer Sister    Pancreatic cancer Sister     Current Medications:  Current Outpatient Medications:    aspirin 81 MG chewable tablet, Chew 81 mg by mouth daily. , Disp: , Rfl:    Cyanocobalamin (VITAMIN B-12 PO), Take 500 mcg by mouth daily., Disp: , Rfl:    diphenhydrAMINE (BENADRYL) 50 MG tablet, Take 50 mg by mouth daily., Disp: , Rfl:    gabapentin (NEURONTIN) 300 MG capsule, Take 300 mg by mouth 3 (three) times daily., Disp: , Rfl:    glipiZIDE (GLUCOTROL XL) 10 MG 24 hr tablet, Take 10 mg by mouth daily with breakfast., Disp: , Rfl:     lisinopril-hydrochlorothiazide (PRINZIDE,ZESTORETIC) 20-25 MG tablet, Take 1 tablet by mouth at bedtime. , Disp: , Rfl:    Magnesium 250 MG TABS, Take 250 mg by mouth 3 (three) times a week., Disp: , Rfl:    metFORMIN (GLUCOPHAGE-XR) 500 MG 24 hr tablet, Take 500 mg by mouth daily with breakfast., Disp: , Rfl:    Multiple Vitamin (MULTIVITAMIN) tablet, Take 1 tablet by mouth daily., Disp: , Rfl:    Risankizumab-rzaa (SKYRIZI PEN) 150 MG/ML SOAJ, Inject 150mg  into the skin at Week 0, Week 4, then every 12 weeks., Disp: 3 mL, Rfl: 0   Vitamin D, Cholecalciferol, 10 MCG (400 UNIT) TABS,  Take 125 mg by mouth daily., Disp: , Rfl:    acetaminophen (TYLENOL) 500 MG tablet, Take 1,000 mg by mouth every 8 (eight) hours as needed for mild pain or headache. (Patient not taking: Reported on 12/06/2022), Disp: , Rfl:    calcipotriene (DOVONOX) 0.005 % ointment, Apply topically. (Patient not taking: Reported on 12/06/2022), Disp: , Rfl:    hydrocortisone 2.5 % cream, Apply topically daily. (Patient not taking: Reported on 12/06/2022), Disp: , Rfl:    OZEMPIC, 0.25 OR 0.5 MG/DOSE, 2 MG/3ML SOPN, Inject 0.5 mg into the skin once a week. (Patient not taking: Reported on 12/06/2022), Disp: , Rfl:    Allergies: Allergies  Allergen Reactions   Sulfa Antibiotics Rash    REVIEW OF SYSTEMS:   Review of Systems  Constitutional:  Negative for chills, fatigue and fever.  HENT:   Negative for lump/mass, mouth sores, nosebleeds, sore throat and trouble swallowing.   Eyes:  Negative for eye problems.  Respiratory:  Positive for cough. Negative for shortness of breath.   Cardiovascular:  Positive for palpitations. Negative for chest pain and leg swelling.  Gastrointestinal:  Positive for constipation. Negative for abdominal pain, diarrhea, nausea and vomiting.  Genitourinary:  Negative for bladder incontinence, difficulty urinating, dysuria, frequency, hematuria and nocturia.   Musculoskeletal:  Positive for arthralgias.  Negative for back pain, flank pain, myalgias and neck pain.  Skin:  Negative for itching and rash.  Neurological:  Positive for numbness. Negative for dizziness and headaches.  Hematological:  Does not bruise/bleed easily.  Psychiatric/Behavioral:  Positive for sleep disturbance. Negative for depression and suicidal ideas. The patient is not nervous/anxious.   All other systems reviewed and are negative.    VITALS:   Blood pressure 139/71, pulse 99, temperature 98.2 F (36.8 C), temperature source Oral, resp. rate 16, height 5' 3.5" (1.613 m), weight 188 lb 14.4 oz (85.7 kg), SpO2 99%.  Wt Readings from Last 3 Encounters:  12/06/22 188 lb 14.4 oz (85.7 kg)  11/06/22 189 lb 14.4 oz (86.1 kg)  10/18/22 188 lb (85.3 kg)    Body mass index is 32.94 kg/m.   PHYSICAL EXAM:   Physical Exam Vitals and nursing note reviewed. Exam conducted with a chaperone present.  Constitutional:      Appearance: Normal appearance.  Cardiovascular:     Rate and Rhythm: Normal rate and regular rhythm.     Pulses: Normal pulses.     Heart sounds: Normal heart sounds.  Pulmonary:     Effort: Pulmonary effort is normal.     Breath sounds: Normal breath sounds.  Abdominal:     Palpations: Abdomen is soft. There is no hepatomegaly, splenomegaly or mass.     Tenderness: There is no abdominal tenderness.  Musculoskeletal:     Right lower leg: No edema.     Left lower leg: No edema.  Lymphadenopathy:     Cervical: No cervical adenopathy.     Right cervical: No superficial, deep or posterior cervical adenopathy.    Left cervical: No superficial, deep or posterior cervical adenopathy.     Upper Body:     Right upper body: No supraclavicular or axillary adenopathy.     Left upper body: No supraclavicular or axillary adenopathy.  Neurological:     General: No focal deficit present.     Mental Status: She is alert and oriented to person, place, and time.  Psychiatric:        Mood and Affect: Mood  normal.  Behavior: Behavior normal.     LABS:      Latest Ref Rng & Units 10/08/2022    7:40 AM 08/16/2022    8:28 AM 06/06/2022   11:35 AM  CBC  WBC 3.8 - 10.8 Thousand/uL 7.0  8.1  8.4   Hemoglobin 11.7 - 15.5 g/dL 65.7  84.6  96.2   Hematocrit 35.0 - 45.0 % 41.6  42.9  46.0   Platelets 140 - 400 Thousand/uL 348  342  361       Latest Ref Rng & Units 10/08/2022    7:40 AM 08/16/2022    8:28 AM 06/06/2022   11:35 AM  CMP  Glucose 65 - 139 mg/dL 952  841  324   BUN 7 - 25 mg/dL 10  11  13    Creatinine 0.60 - 1.00 mg/dL 4.01  0.27  2.53   Sodium 135 - 146 mmol/L 138  140  143   Potassium 3.5 - 5.3 mmol/L 3.9  3.9  3.9   Chloride 98 - 110 mmol/L 98  101  97   CO2 20 - 32 mmol/L 30  26  24    Calcium 8.6 - 10.4 mg/dL 9.4  9.8  66.4   Total Protein 6.1 - 8.1 g/dL 7.0  7.3  8.2    7.9   Total Bilirubin 0.2 - 1.2 mg/dL 0.5  0.4  0.3   AST 10 - 35 U/L 93  73  123   ALT 6 - 29 U/L 131  102  157      No results found for: "CEA1", "CEA" / No results found for: "CEA1", "CEA" No results found for: "PSA1" No results found for: "CAN199" No results found for: "CAN125"  Lab Results  Component Value Date   ALBUMINELP 4.7 06/06/2022   A1GS 0.3 06/06/2022   A2GS 0.9 06/06/2022   BETS 0.5 06/06/2022   BETA2SER 0.5 06/06/2022   GAMS 1.1 06/06/2022   SPEI  06/06/2022     Comment:     Normal Serum Protein Electrophoresis Pattern. No abnormal protein bands (M-protein) detected.    Lab Results  Component Value Date   TIBC 281 11/06/2022   FERRITIN 390 (H) 11/06/2022   FERRITIN 136 11/06/2016   IRONPCTSAT 37 11/06/2022   No results found for: "LDH"   STUDIES:   No results found.

## 2022-12-06 ENCOUNTER — Inpatient Hospital Stay: Payer: Medicare Other | Attending: Hematology | Admitting: Hematology

## 2022-12-06 ENCOUNTER — Encounter: Payer: Self-pay | Admitting: Hematology

## 2022-12-06 VITALS — BP 139/71 | HR 99 | Temp 98.2°F | Resp 16 | Ht 63.5 in | Wt 188.9 lb

## 2022-12-06 DIAGNOSIS — Z79899 Other long term (current) drug therapy: Secondary | ICD-10-CM | POA: Insufficient documentation

## 2022-12-06 DIAGNOSIS — R7989 Other specified abnormal findings of blood chemistry: Secondary | ICD-10-CM | POA: Diagnosis not present

## 2022-12-06 DIAGNOSIS — Z800 Family history of malignant neoplasm of digestive organs: Secondary | ICD-10-CM | POA: Insufficient documentation

## 2022-12-06 DIAGNOSIS — Z801 Family history of malignant neoplasm of trachea, bronchus and lung: Secondary | ICD-10-CM | POA: Diagnosis not present

## 2022-12-06 DIAGNOSIS — Z148 Genetic carrier of other disease: Secondary | ICD-10-CM | POA: Insufficient documentation

## 2022-12-06 NOTE — Patient Instructions (Addendum)
You were seen and examined today by Dr. Ellin Saba. Dr. Ellin Saba is a hematologist, meaning that he specializes in blood abnormalities. Dr. Ellin Saba discussed your past medical history, family history of cancers/blood conditions, and the events that led to you being here today.  You were referred to Dr. Ellin Saba due to being a hemochromatosis carrier.  He advised you to avoid using Vitamin C supplements, iron supplements, and cooking in cast iron pots and pans. But your ferritin is only slightly high, and this could be caused by a variety of reasons. It could be related to the fatty liver, or any inflammation in the body. But he does not believe your most recent elevated ferritin is any cause for concern.   Follow-up as scheduled.

## 2022-12-10 ENCOUNTER — Ambulatory Visit (INDEPENDENT_AMBULATORY_CARE_PROVIDER_SITE_OTHER): Payer: Medicare Other | Admitting: Gastroenterology

## 2022-12-27 ENCOUNTER — Telehealth: Payer: Self-pay | Admitting: *Deleted

## 2022-12-27 DIAGNOSIS — Z79899 Other long term (current) drug therapy: Secondary | ICD-10-CM

## 2022-12-27 NOTE — Telephone Encounter (Signed)
Patient contacted the office stating she is due to take her Skyrizi. Patient states she has been diagnosed with Covid. Patient advised she will want to hold her medication for 2 weeks after symptoms have resolved. Patient expressed understanding. Patient inquired about labs and states she would like to have them done at [facility] in [place]. Patient advised to call the day before she plans on going so we can release the orders.

## 2023-01-02 NOTE — Progress Notes (Unsigned)
Office Visit Note  Patient: Dorothy Robbins             Date of Birth: 1951-05-21           MRN: [id number]             PCP: Lianne Moris, PA-C Referring: Lianne Moris, PA-C Visit Date: 01/16/2023 Occupation: @GUAROCC @  Subjective:  Medication monitoring   History of Present Illness: Dorothy Robbins is a 72 y.o. female with history of psoriatic arthritis and osteoarthritis.  Patient initiated Skyrizi on 09/06/2022.   CBC and CMP updated on 10/08/22. Orders for CBC and CMP released today.  TB gold negative on 06/06/22.  Discussed the importance of holding skyrizi if she develops signs or symptoms of an infection and to resume once the infection has completely cleared.    Activities of Daily Living:  Patient reports morning stiffness for *** {minute/hour:19697}.   Patient {ACTIONS;DENIES/REPORTS:21021675::"Denies"} nocturnal pain.  Difficulty dressing/grooming: {ACTIONS;DENIES/REPORTS:21021675::"Denies"} Difficulty climbing stairs: {ACTIONS;DENIES/REPORTS:21021675::"Denies"} Difficulty getting out of chair: {ACTIONS;DENIES/REPORTS:21021675::"Denies"} Difficulty using hands for taps, buttons, cutlery, and/or writing: {ACTIONS;DENIES/REPORTS:21021675::"Denies"}  No Rheumatology ROS completed.   PMFS History:  Patient Active Problem List   Diagnosis Date Noted   Elevated ferritin 12/06/2022   Hemochromatosis carrier 12/06/2022   Psoriatic arthritis (HCC) 06/28/2022   Psoriasis 06/28/2022   Elevated LFTs 06/28/2022   Primary osteoarthritis of both knees 06/28/2022   Primary osteoarthritis of both feet 06/28/2022   Lumbar stenosis with neurogenic claudication 11/07/2018   Elevated liver enzymes 11/06/2016   Essential hypertension 05/08/2016   Diabetes (HCC) 05/08/2016    Past Medical History:  Diagnosis Date   Diabetes (HCC) 05/08/2016   Elevated liver enzymes 11/06/2016   Essential hypertension 05/08/2016   Squamous cell carcinoma, leg     Family History  Problem  Relation Age of Onset   Diabetes Mother    Heart disease Mother    Thyroid disease Mother    Aneurysm Father    Hypertension Father    COPD Sister    Lung cancer Sister    Pancreatic cancer Sister    Past Surgical History:  Procedure Laterality Date   BACK SURGERY     SKIN CANCER EXCISION  06/07/2022   leg                   6/24- left forearm   SURGERY OF LIP     Social History   Social History Narrative   Not on file    There is no immunization history on file for this patient.   Objective: Vital Signs: There were no vitals taken for this visit.   Physical Exam Vitals and nursing note reviewed.  Constitutional:      Appearance: She is well-developed.  HENT:     Head: Normocephalic and atraumatic.  Eyes:     Conjunctiva/sclera: Conjunctivae normal.  Cardiovascular:     Rate and Rhythm: Normal rate and regular rhythm.     Heart sounds: Normal heart sounds.  Pulmonary:     Effort: Pulmonary effort is normal.     Breath sounds: Normal breath sounds.  Abdominal:     General: Bowel sounds are normal.     Palpations: Abdomen is soft.  Musculoskeletal:     Cervical back: Normal range of motion.  Lymphadenopathy:     Cervical: No cervical adenopathy.  Skin:    General: Skin is warm and dry.     Capillary Refill: Capillary refill takes less than 2 seconds.  Neurological:  Mental Status: She is alert and oriented to person, place, and time.  Psychiatric:        Behavior: Behavior normal.      Musculoskeletal Exam: ***  CDAI Exam: CDAI Score: -- Patient Global: --; Provider Global: -- Swollen: --; Tender: -- Joint Exam 01/16/2023   No joint exam has been documented for this visit   There is currently no information documented on the homunculus. Go to the Rheumatology activity and complete the homunculus joint exam.  Investigation: No additional findings.  Imaging: No results found.  Recent Labs: Lab Results  Component Value Date   WBC 7.0  10/08/2022   HGB 14.2 10/08/2022   PLT 348 10/08/2022   NA 138 10/08/2022   K 3.9 10/08/2022   CL 98 10/08/2022   CO2 30 10/08/2022   GLUCOSE 210 (H) 10/08/2022   BUN 10 10/08/2022   CREATININE 0.76 10/08/2022   BILITOT 0.5 10/08/2022   ALKPHOS 52 11/04/2018   AST 93 (H) 10/08/2022   ALT 131 (H) 10/08/2022   PROT 7.0 10/08/2022   ALBUMIN 4.4 11/04/2018   CALCIUM 9.4 10/08/2022   GFRAA >60 11/07/2018   QFTBGOLDPLUS NEGATIVE 06/06/2022    Speciality Comments: No specialty comments available.  Procedures:  No procedures performed Allergies: Sulfa antibiotics   Assessment / Plan:     Visit Diagnoses: Psoriatic arthritis (HCC)  Psoriasis  High risk medication use  Elevated LFTs  Fatty liver  History of ulcer of lower extremity  Primary osteoarthritis of both hands  Chronic pain of both hips  Primary osteoarthritis of both knees  Primary osteoarthritis of both feet  Chronic SI joint pain  Lumbar stenosis with neurogenic claudication  Essential hypertension  History of diabetes mellitus  Cancer, skin, squamous cell-right lower extremity  Family history of systemic lupus erythematosus-paternal aunt and grand niece  Orders: No orders of the defined types were placed in this encounter.  No orders of the defined types were placed in this encounter.   Face-to-face time spent with patient was *** minutes. Greater than 50% of time was spent in counseling and coordination of care.  Follow-Up Instructions: No follow-ups on file.   Gearldine Bienenstock, PA-C  Note - This record has been created using Dragon software.  Chart creation errors have been sought, but may not always  have been located. Such creation errors do not reflect on  the standard of medical care.

## 2023-01-09 DIAGNOSIS — L409 Psoriasis, unspecified: Secondary | ICD-10-CM | POA: Diagnosis not present

## 2023-01-16 ENCOUNTER — Encounter: Payer: Self-pay | Admitting: Physician Assistant

## 2023-01-16 ENCOUNTER — Ambulatory Visit: Payer: Medicare Other | Attending: Physician Assistant | Admitting: Physician Assistant

## 2023-01-16 VITALS — BP 137/83 | HR 81 | Resp 14 | Ht 63.5 in | Wt 188.2 lb

## 2023-01-16 DIAGNOSIS — R7989 Other specified abnormal findings of blood chemistry: Secondary | ICD-10-CM

## 2023-01-16 DIAGNOSIS — Z872 Personal history of diseases of the skin and subcutaneous tissue: Secondary | ICD-10-CM

## 2023-01-16 DIAGNOSIS — M25552 Pain in left hip: Secondary | ICD-10-CM

## 2023-01-16 DIAGNOSIS — M533 Sacrococcygeal disorders, not elsewhere classified: Secondary | ICD-10-CM

## 2023-01-16 DIAGNOSIS — K760 Fatty (change of) liver, not elsewhere classified: Secondary | ICD-10-CM

## 2023-01-16 DIAGNOSIS — Z8639 Personal history of other endocrine, nutritional and metabolic disease: Secondary | ICD-10-CM

## 2023-01-16 DIAGNOSIS — L409 Psoriasis, unspecified: Secondary | ICD-10-CM | POA: Diagnosis not present

## 2023-01-16 DIAGNOSIS — Z79899 Other long term (current) drug therapy: Secondary | ICD-10-CM | POA: Diagnosis not present

## 2023-01-16 DIAGNOSIS — C4492 Squamous cell carcinoma of skin, unspecified: Secondary | ICD-10-CM

## 2023-01-16 DIAGNOSIS — M170 Bilateral primary osteoarthritis of knee: Secondary | ICD-10-CM

## 2023-01-16 DIAGNOSIS — Z8269 Family history of other diseases of the musculoskeletal system and connective tissue: Secondary | ICD-10-CM

## 2023-01-16 DIAGNOSIS — M19041 Primary osteoarthritis, right hand: Secondary | ICD-10-CM

## 2023-01-16 DIAGNOSIS — M19042 Primary osteoarthritis, left hand: Secondary | ICD-10-CM

## 2023-01-16 DIAGNOSIS — M48062 Spinal stenosis, lumbar region with neurogenic claudication: Secondary | ICD-10-CM

## 2023-01-16 DIAGNOSIS — G8929 Other chronic pain: Secondary | ICD-10-CM

## 2023-01-16 DIAGNOSIS — L4050 Arthropathic psoriasis, unspecified: Secondary | ICD-10-CM

## 2023-01-16 DIAGNOSIS — M19071 Primary osteoarthritis, right ankle and foot: Secondary | ICD-10-CM

## 2023-01-16 DIAGNOSIS — M25551 Pain in right hip: Secondary | ICD-10-CM

## 2023-01-16 DIAGNOSIS — I10 Essential (primary) hypertension: Secondary | ICD-10-CM

## 2023-01-16 DIAGNOSIS — M19072 Primary osteoarthritis, left ankle and foot: Secondary | ICD-10-CM

## 2023-01-16 LAB — CBC WITH DIFFERENTIAL/PLATELET
Absolute Monocytes: 318 cells/uL (ref 200–950)
Basophils Absolute: 32 cells/uL (ref 0–200)
Basophils Relative: 0.6 %
Eosinophils Absolute: 408 cells/uL (ref 15–500)
Eosinophils Relative: 7.7 %
HCT: 43.2 % (ref 35.0–45.0)
Hemoglobin: 14.8 g/dL (ref 11.7–15.5)
Lymphs Abs: 1749 cells/uL (ref 850–3900)
MCH: 31.2 pg (ref 27.0–33.0)
MCHC: 34.3 g/dL (ref 32.0–36.0)
MCV: 90.9 fL (ref 80.0–100.0)
MPV: 11.7 fL (ref 7.5–12.5)
Monocytes Relative: 6 %
Neutro Abs: 2793 cells/uL (ref 1500–7800)
Neutrophils Relative %: 52.7 %
Platelets: 301 10*3/uL (ref 140–400)
RBC: 4.75 10*6/uL (ref 3.80–5.10)
RDW: 12.8 % (ref 11.0–15.0)
Total Lymphocyte: 33 %
WBC: 5.3 10*3/uL (ref 3.8–10.8)

## 2023-01-16 LAB — COMPLETE METABOLIC PANEL WITH GFR
AG Ratio: 1.4 (calc) (ref 1.0–2.5)
ALT: 118 U/L — ABNORMAL HIGH (ref 6–29)
AST: 72 U/L — ABNORMAL HIGH (ref 10–35)
Albumin: 4.3 g/dL (ref 3.6–5.1)
Alkaline phosphatase (APISO): 65 U/L (ref 37–153)
BUN: 11 mg/dL (ref 7–25)
CO2: 30 mmol/L (ref 20–32)
Calcium: 9.9 mg/dL (ref 8.6–10.4)
Chloride: 97 mmol/L — ABNORMAL LOW (ref 98–110)
Creat: 0.88 mg/dL (ref 0.60–1.00)
Globulin: 3 g/dL (calc) (ref 1.9–3.7)
Glucose, Bld: 192 mg/dL — ABNORMAL HIGH (ref 65–99)
Potassium: 3.9 mmol/L (ref 3.5–5.3)
Sodium: 137 mmol/L (ref 135–146)
Total Bilirubin: 0.4 mg/dL (ref 0.2–1.2)
Total Protein: 7.3 g/dL (ref 6.1–8.1)
eGFR: 70 mL/min/{1.73_m2} (ref >=60)

## 2023-01-16 MED ORDER — SKYRIZI PEN 150 MG/ML ~~LOC~~ SOAJ
SUBCUTANEOUS | 0 refills | Status: DC
Start: 2023-01-16 — End: 2024-03-05

## 2023-01-16 NOTE — Progress Notes (Signed)
CBC WNL Glucose is elevated-192.  AST and ALT remain elevated but have improved slightly.   Please forward results to GI as requested.

## 2023-02-04 DIAGNOSIS — L409 Psoriasis, unspecified: Secondary | ICD-10-CM | POA: Diagnosis not present

## 2023-02-04 DIAGNOSIS — Z6833 Body mass index (BMI) 33.0-33.9, adult: Secondary | ICD-10-CM | POA: Diagnosis not present

## 2023-02-04 DIAGNOSIS — R21 Rash and other nonspecific skin eruption: Secondary | ICD-10-CM | POA: Diagnosis not present

## 2023-02-05 ENCOUNTER — Encounter: Payer: Self-pay | Admitting: Rheumatology

## 2023-02-05 NOTE — Telephone Encounter (Signed)
It is very difficult to discern if some of these patches are due to psoriasis.  Ok to complete prednisone prescribed by PCP.  Hydroxyzine should help with the itching. If her symptoms persist worsen I would recommend continuing to reach out to dermatology to see if they have any cancellations so she can be seen sooner.

## 2023-02-05 NOTE — Telephone Encounter (Signed)
Please call to clarify if she is having a flare of psoriasis or another rash?

## 2023-02-08 DIAGNOSIS — R21 Rash and other nonspecific skin eruption: Secondary | ICD-10-CM | POA: Diagnosis not present

## 2023-02-11 ENCOUNTER — Ambulatory Visit (INDEPENDENT_AMBULATORY_CARE_PROVIDER_SITE_OTHER): Payer: Medicare Other | Admitting: Gastroenterology

## 2023-02-12 ENCOUNTER — Ambulatory Visit: Payer: Medicare Other | Admitting: Rheumatology

## 2023-02-12 DIAGNOSIS — L308 Other specified dermatitis: Secondary | ICD-10-CM | POA: Diagnosis not present

## 2023-02-13 ENCOUNTER — Other Ambulatory Visit (INDEPENDENT_AMBULATORY_CARE_PROVIDER_SITE_OTHER): Payer: Self-pay | Admitting: *Deleted

## 2023-02-13 ENCOUNTER — Other Ambulatory Visit (INDEPENDENT_AMBULATORY_CARE_PROVIDER_SITE_OTHER): Payer: Self-pay | Admitting: Gastroenterology

## 2023-02-13 DIAGNOSIS — R7989 Other specified abnormal findings of blood chemistry: Secondary | ICD-10-CM

## 2023-02-14 LAB — HEPATIC FUNCTION PANEL
ALT: 139 IU/L — ABNORMAL HIGH (ref 0–32)
AST: 70 IU/L — ABNORMAL HIGH (ref 0–40)
Albumin: 4.2 g/dL (ref 3.8–4.8)
Alkaline Phosphatase: 78 IU/L (ref 44–121)
Bilirubin Total: 0.4 mg/dL (ref 0.0–1.2)
Bilirubin, Direct: 0.12 mg/dL (ref 0.00–0.40)
Total Protein: 7.2 g/dL (ref 6.0–8.5)

## 2023-03-04 DIAGNOSIS — L239 Allergic contact dermatitis, unspecified cause: Secondary | ICD-10-CM | POA: Diagnosis not present

## 2023-03-08 DIAGNOSIS — L308 Other specified dermatitis: Secondary | ICD-10-CM | POA: Diagnosis not present

## 2023-03-08 DIAGNOSIS — L2089 Other atopic dermatitis: Secondary | ICD-10-CM | POA: Diagnosis not present

## 2023-03-13 ENCOUNTER — Encounter (INDEPENDENT_AMBULATORY_CARE_PROVIDER_SITE_OTHER): Payer: Self-pay | Admitting: Gastroenterology

## 2023-03-19 NOTE — Telephone Encounter (Signed)
Ok to continue as combination therapy.

## 2023-03-20 DIAGNOSIS — L409 Psoriasis, unspecified: Secondary | ICD-10-CM | POA: Diagnosis not present

## 2023-03-20 DIAGNOSIS — L299 Pruritus, unspecified: Secondary | ICD-10-CM | POA: Diagnosis not present

## 2023-03-20 DIAGNOSIS — Z91018 Allergy to other foods: Secondary | ICD-10-CM | POA: Diagnosis not present

## 2023-03-20 DIAGNOSIS — R21 Rash and other nonspecific skin eruption: Secondary | ICD-10-CM | POA: Diagnosis not present

## 2023-04-10 NOTE — Progress Notes (Unsigned)
Office Visit Note  Patient: Dorothy Robbins             Date of Birth: 07/01/1950           MRN: [id number]             PCP: Lianne Moris, PA-C Referring: Lianne Moris, PA-C Visit Date: 04/24/2023 Occupation: @GUAROCC @  Subjective:    History of Present Illness: DEPRISE NOVELO is a 72 y.o. female with history of psoriatic arthritis and osteoarthritis.  Patient remains on Skyrizi 150 mg sq injections every 12 weeks.  Initiated [employer] on 09/06/2022.   CBC and CMP updated on 01/16/23. Orders for CBC and CMP released today.  TB gold negative on 06/06/22.  Discussed the importance of holding skyrizi if she develops signs or symptoms of an infection and to resume once the infection has completely cleared.  Activities of Daily Living:  Patient reports morning stiffness for *** {minute/hour:19697}.   Patient {ACTIONS;DENIES/REPORTS:21021675::"Denies"} nocturnal pain.  Difficulty dressing/grooming: {ACTIONS;DENIES/REPORTS:21021675::"Denies"} Difficulty climbing stairs: {ACTIONS;DENIES/REPORTS:21021675::"Denies"} Difficulty getting out of chair: {ACTIONS;DENIES/REPORTS:21021675::"Denies"} Difficulty using hands for taps, buttons, cutlery, and/or writing: {ACTIONS;DENIES/REPORTS:21021675::"Denies"}  No Rheumatology ROS completed.   PMFS History:  Patient Active Problem List   Diagnosis Date Noted   Elevated ferritin 12/06/2022   Hemochromatosis carrier 12/06/2022   Psoriatic arthritis (HCC) 06/28/2022   Psoriasis 06/28/2022   Elevated LFTs 06/28/2022   Primary osteoarthritis of both knees 06/28/2022   Primary osteoarthritis of both feet 06/28/2022   Lumbar stenosis with neurogenic claudication 11/07/2018   Elevated liver enzymes 11/06/2016   Essential hypertension 05/08/2016   Diabetes (HCC) 05/08/2016    Past Medical History:  Diagnosis Date   Diabetes (HCC) 05/08/2016   Elevated liver enzymes 11/06/2016   Essential hypertension 05/08/2016   Squamous cell carcinoma, leg      Family History  Problem Relation Age of Onset   Diabetes Mother    Heart disease Mother    Thyroid disease Mother    Aneurysm Father    Hypertension Father    COPD Sister    Lung cancer Sister    Pancreatic cancer Sister    Past Surgical History:  Procedure Laterality Date   BACK SURGERY     SKIN CANCER EXCISION  06/07/2022   leg       ,            6/24- left forearm   SURGERY OF LIP     Social History   Social History Narrative   Not on file    There is no immunization history on file for this patient.   Objective: Vital Signs: There were no vitals taken for this visit.   Physical Exam Vitals and nursing note reviewed.  Constitutional:      Appearance: She is well-developed.  HENT:     Head: Normocephalic and atraumatic.  Eyes:     Conjunctiva/sclera: Conjunctivae normal.  Cardiovascular:     Rate and Rhythm: Normal rate and regular rhythm.     Heart sounds: Normal heart sounds.  Pulmonary:     Effort: Pulmonary effort is normal.     Breath sounds: Normal breath sounds.  Abdominal:     General: Bowel sounds are normal.     Palpations: Abdomen is soft.  Musculoskeletal:     Cervical back: Normal range of motion.  Lymphadenopathy:     Cervical: No cervical adenopathy.  Skin:    General: Skin is warm and dry.     Capillary Refill: Capillary refill takes  less than 2 seconds.  Neurological:     Mental Status: She is alert and oriented to person, place, and time.  Psychiatric:        Behavior: Behavior normal.      Musculoskeletal Exam: ***  CDAI Exam: CDAI Score: -- Patient Global: --; Provider Global: -- Swollen: --; Tender: -- Joint Exam 04/24/2023   No joint exam has been documented for this visit   There is currently no information documented on the homunculus. Go to the Rheumatology activity and complete the homunculus joint exam.  Investigation: No additional findings.  Imaging: No results found.  Recent Labs: Lab Results  Component  Value Date   WBC 5.3 01/16/2023   HGB 14.8 01/16/2023   PLT 301 01/16/2023   NA 137 01/16/2023   K 3.9 01/16/2023   CL 97 (L) 01/16/2023   CO2 30 01/16/2023   GLUCOSE 192 (H) 01/16/2023   BUN 11 01/16/2023   CREATININE 0.88 01/16/2023   BILITOT 0.4 02/13/2023   ALKPHOS 78 02/13/2023   AST 70 (H) 02/13/2023   ALT 139 (H) 02/13/2023   PROT 7.2 02/13/2023   ALBUMIN 4.2 02/13/2023   CALCIUM 9.9 01/16/2023   GFRAA >60 11/07/2018   QFTBGOLDPLUS NEGATIVE 06/06/2022    Speciality Comments: No specialty comments available.  Procedures:  No procedures performed Allergies: Sulfa antibiotics   Assessment / Plan:     Visit Diagnoses: Psoriatic arthritis (HCC)  Psoriasis  High risk medication use  Elevated LFTs  Fatty liver  History of ulcer of lower extremity  Primary osteoarthritis of both hands  Chronic pain of both hips  Primary osteoarthritis of both knees  Primary osteoarthritis of both feet  Chronic SI joint pain  Lumbar stenosis with neurogenic claudication  Essential hypertension  History of diabetes mellitus  Cancer, skin, squamous cell-right lower extremity  Family history of systemic lupus erythematosus-paternal aunt and grand niece  Hereditary hemochromatosis (HCC)  Orders: No orders of the defined types were placed in this encounter.  No orders of the defined types were placed in this encounter.   Face-to-face time spent with patient was *** minutes. Greater than 50% of time was spent in counseling and coordination of care.  Follow-Up Instructions: No follow-ups on file.   Gearldine Bienenstock, PA-C  Note - This record has been created using Dragon software.  Chart creation errors have been sought, but may not always  have been located. Such creation errors do not reflect on  the standard of medical care.

## 2023-04-22 ENCOUNTER — Ambulatory Visit (INDEPENDENT_AMBULATORY_CARE_PROVIDER_SITE_OTHER): Payer: Medicare Other | Admitting: Gastroenterology

## 2023-04-24 ENCOUNTER — Ambulatory Visit: Payer: Medicare Other | Admitting: Physician Assistant

## 2023-04-24 DIAGNOSIS — Z8269 Family history of other diseases of the musculoskeletal system and connective tissue: Secondary | ICD-10-CM

## 2023-04-24 DIAGNOSIS — K760 Fatty (change of) liver, not elsewhere classified: Secondary | ICD-10-CM

## 2023-04-24 DIAGNOSIS — M170 Bilateral primary osteoarthritis of knee: Secondary | ICD-10-CM

## 2023-04-24 DIAGNOSIS — Z872 Personal history of diseases of the skin and subcutaneous tissue: Secondary | ICD-10-CM

## 2023-04-24 DIAGNOSIS — G8929 Other chronic pain: Secondary | ICD-10-CM

## 2023-04-24 DIAGNOSIS — R7989 Other specified abnormal findings of blood chemistry: Secondary | ICD-10-CM

## 2023-04-24 DIAGNOSIS — L4050 Arthropathic psoriasis, unspecified: Secondary | ICD-10-CM

## 2023-04-24 DIAGNOSIS — Z79899 Other long term (current) drug therapy: Secondary | ICD-10-CM

## 2023-04-24 DIAGNOSIS — M19071 Primary osteoarthritis, right ankle and foot: Secondary | ICD-10-CM

## 2023-04-24 DIAGNOSIS — M48062 Spinal stenosis, lumbar region with neurogenic claudication: Secondary | ICD-10-CM

## 2023-04-24 DIAGNOSIS — C4492 Squamous cell carcinoma of skin, unspecified: Secondary | ICD-10-CM

## 2023-04-24 DIAGNOSIS — M19041 Primary osteoarthritis, right hand: Secondary | ICD-10-CM

## 2023-04-24 DIAGNOSIS — L409 Psoriasis, unspecified: Secondary | ICD-10-CM

## 2023-04-24 DIAGNOSIS — I10 Essential (primary) hypertension: Secondary | ICD-10-CM

## 2023-04-24 DIAGNOSIS — Z8639 Personal history of other endocrine, nutritional and metabolic disease: Secondary | ICD-10-CM

## 2023-04-25 ENCOUNTER — Encounter (INDEPENDENT_AMBULATORY_CARE_PROVIDER_SITE_OTHER): Payer: Self-pay | Admitting: Gastroenterology

## 2023-04-25 DIAGNOSIS — E041 Nontoxic single thyroid nodule: Secondary | ICD-10-CM | POA: Diagnosis not present

## 2023-04-25 DIAGNOSIS — E7849 Other hyperlipidemia: Secondary | ICD-10-CM | POA: Diagnosis not present

## 2023-04-25 DIAGNOSIS — E7801 Familial hypercholesterolemia: Secondary | ICD-10-CM | POA: Diagnosis not present

## 2023-04-25 DIAGNOSIS — R748 Abnormal levels of other serum enzymes: Secondary | ICD-10-CM | POA: Diagnosis not present

## 2023-04-25 DIAGNOSIS — E782 Mixed hyperlipidemia: Secondary | ICD-10-CM | POA: Diagnosis not present

## 2023-04-25 DIAGNOSIS — E7800 Pure hypercholesterolemia, unspecified: Secondary | ICD-10-CM | POA: Diagnosis not present

## 2023-04-25 DIAGNOSIS — E1165 Type 2 diabetes mellitus with hyperglycemia: Secondary | ICD-10-CM | POA: Diagnosis not present

## 2023-04-25 DIAGNOSIS — I10 Essential (primary) hypertension: Secondary | ICD-10-CM | POA: Diagnosis not present

## 2023-05-01 DIAGNOSIS — E1165 Type 2 diabetes mellitus with hyperglycemia: Secondary | ICD-10-CM | POA: Diagnosis not present

## 2023-05-01 DIAGNOSIS — Z0001 Encounter for general adult medical examination with abnormal findings: Secondary | ICD-10-CM | POA: Diagnosis not present

## 2023-05-01 DIAGNOSIS — E7849 Other hyperlipidemia: Secondary | ICD-10-CM | POA: Diagnosis not present

## 2023-05-01 DIAGNOSIS — L4050 Arthropathic psoriasis, unspecified: Secondary | ICD-10-CM | POA: Diagnosis not present

## 2023-05-01 DIAGNOSIS — I10 Essential (primary) hypertension: Secondary | ICD-10-CM | POA: Diagnosis not present

## 2023-05-06 ENCOUNTER — Encounter: Payer: Self-pay | Admitting: Internal Medicine

## 2023-05-06 ENCOUNTER — Ambulatory Visit: Payer: Medicare Other | Admitting: Internal Medicine

## 2023-05-06 ENCOUNTER — Other Ambulatory Visit: Payer: Self-pay

## 2023-05-06 VITALS — BP 132/64 | HR 105 | Temp 98.0°F | Ht 62.4 in | Wt 180.6 lb

## 2023-05-06 DIAGNOSIS — J3089 Other allergic rhinitis: Secondary | ICD-10-CM

## 2023-05-06 DIAGNOSIS — R21 Rash and other nonspecific skin eruption: Secondary | ICD-10-CM

## 2023-05-06 DIAGNOSIS — Z91018 Allergy to other foods: Secondary | ICD-10-CM | POA: Diagnosis not present

## 2023-05-06 MED ORDER — FEXOFENADINE HCL 180 MG PO TABS: 180.0000 mg | ORAL_TABLET | Freq: Every day | ORAL | 5 refills | Status: DC

## 2023-05-06 MED ORDER — EPINEPHRINE 0.3 MG/0.3ML IJ SOAJ: 0.3000 mg | INTRAMUSCULAR | 1 refills | Status: DC | PRN

## 2023-05-06 NOTE — Progress Notes (Signed)
NEW PATIENT  Date of Service/Encounter:  05/06/23  Consult requested by: Lianne Moris, PA-C   Subjective:   Dorothy Robbins (DOB: [birth year]) is a 72 y.o. female who presents to the clinic on 05/06/2023 with a chief complaint of New Patient (Initial Visit) (Allergic reaction, Rash every where  ) .    History obtained from: chart review and patient.  Rashes:  Ongoing for past 1-1.5 years.   Has seen Dermatology and tried multiple different topical therapy and now on Skyrizi for past 9 months for psoriasis without improvement.  Did do biopsy without clear results per patient.  Rash is diffusely located on arms, legs, stomach, chest, back and now some on face.   Was tested for alpha gal 2-3 months ago due to indigestion/stomach pain/rashes.  It was positive and she stopped mammalian meat and underwent acupuncture without any improvement of the rash.  GI symptoms did get better. Never has had anaphylaxis.  Has not seen academic Dermatology Underwent patch testing that was negative.    Rhinitis:  Ongoing for many years.  Symptoms include: nasal congestion, rhinorrhea, post nasal drainage, and sneezing  Occurs seasonally-Fall Potential triggers: not sure  Treatments tried:  PRN benadryl/hydroxyzine Zyrtec/Allegra  Previous allergy testing: no History of sinus surgery: no Nonallergic triggers: none    Reviewed:  02/11/2023: alpha gal IgE 1.58.  Seen by [facility] for persistent rash,alpha gal.  Referred to Allergy to evaluate for testing for rashes.   02/05/2023: discussed with Dr Corliss Skains Rheumatology for rashes and itching.  Had a positive alpha gal.  Discussed to follow up with Dermatology.  07/18/2022: seen by Tarry Kos for Rheumatology for Psoriatic Arthritis on [place].    11/06/2022: seen by Jeanmarie Hubert NP in GI for elevated LFTs, [place] with diffuse fatty liver.  Some concern for DILI.    Past Medical History: Past Medical History:  Diagnosis Date   Diabetes (HCC)  05/08/2016   Elevated liver enzymes 11/06/2016   Essential hypertension 05/08/2016   Squamous cell carcinoma, leg    Past Surgical History: Past Surgical History:  Procedure Laterality Date   BACK SURGERY     SKIN CANCER EXCISION  06/07/2022   leg       ,            6/24- left forearm   SURGERY OF LIP      Family History: Family History  Problem Relation Age of Onset   Diabetes Mother    Heart disease Mother    Thyroid disease Mother    Aneurysm Father    Hypertension Father    COPD Sister    Lung cancer Sister    Pancreatic cancer Sister     Social History:  Flooring in bedroom: Engineer, civil (consulting) Pets: none Tobacco use/exposure: none 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 If you have any questions, ask your nurse or doctor.          acetaminophen 500 MG tablet Commonly known as: TYLENOL Take 1,000 mg by mouth every 8 (eight) hours as needed for mild pain or headache.   aspirin 81 MG chewable tablet Chew 81 mg by mouth daily.   calcipotriene 0.005 % ointment Commonly known as: DOVONOX Apply topically as needed.   diphenhydrAMINE 50 MG  tablet Commonly known as: BENADRYL Take 50 mg by mouth daily.   gabapentin 300 MG capsule Commonly known as: NEURONTIN Take 300 mg by mouth 3 (three) times daily.   glipiZIDE 10 MG 24 hr tablet Commonly known as: GLUCOTROL XL Take 10 mg by mouth daily with breakfast.   hydrocortisone 2.5 % cream Apply topically as needed.   lisinopril-hydrochlorothiazide 20-25 MG tablet Commonly known as: ZESTORETIC Take 1 tablet by mouth at bedtime.   Magnesium 250 MG Tabs Take 250 mg by mouth 3 (three) times a week.   metFORMIN 500 MG 24 hr tablet Commonly known as: GLUCOPHAGE-XR Take 250 mg by mouth 2 (two) times a week.   multivitamin tablet Take 1 tablet by mouth daily.    OZEMPIC (1 MG/DOSE) [place] Inject into the skin once a week.   Ozempic (0.25 or 0.5 MG/DOSE) 2 MG/3ML Sopn Generic drug: Semaglutide(0.25 or 0.5MG /DOS) Inject 0.5 mg into the skin once a week.   Skyrizi Pen 150 MG/ML pen Generic drug: risankizumab-rzaa Inject 150mg  into the skin every 12 weeks.   TURMERIC PO Take 2,250 mg by mouth daily.   VITAMIN B-12 PO Take 500 mcg by mouth daily.   Vitamin D (Cholecalciferol) 10 MCG (400 UNIT) Tabs Take 125 mg by mouth daily.         REVIEW OF SYSTEMS: Pertinent positives and negatives discussed in HPI.   Objective:   Physical Exam: BP 132/64   Pulse (!) 105   Temp 98 F (36.7 C) (Temporal)   Ht 5' 2.4" (1.585 m)   Wt 180 lb 9.6 oz (81.9 kg)   SpO2 97%   BMI 32.61 kg/m  Body mass index is 32.61 kg/m. GEN: alert, well developed HEENT: clear conjunctiva, nose with + mild inferior turbinate hypertrophy, pink nasal mucosa, slight clear rhinorrhea, no cobblestoning HEART: regular rate and rhythm, no murmur LUNGS: clear to auscultation bilaterally, no coughing, unlabored respiration ABDOMEN: soft, non distended  SKIN: pink patches diffusely on arms, legs, back, chest, stomach; some with flaking/scaling  Assessment:   1. Other allergic rhinitis   2. Rash and other nonspecific skin eruption   3. Allergy to alpha-gal     Plan/Recommendations:  Alpha Gal Syndrome:  - please strictly avoid all mammalian meat.  - Initial rxn: rashes, GI symptoms. Avoidance of mammalian meat improved GI symptoms but the rashes have persisted.  We discussed these rashes are not from alpha gal (not hives and not improved with mammalian meat avoidance).  - for SKIN only reaction, okay to take Benadryl 25mg  tablet every 6 hours as needed - for SKIN + ANY additional symptoms, OR IF concern for LIFE THREATENING reaction = Epipen Autoinjector EpiPen 0.3 mg. - If using Epinephrine autoinjector, call 911 or go to the ER.   Other Allergic Rhinitis: - Due to  turbinate hypertrophy, seasonal symptoms and unresponsive to over the counter meds, will perform skin testing to identify aeroallergen triggers.   - Use nasal saline rinses before nose sprays such as with Neilmed Sinus Rinse.  Use distilled water.   - Use Allegra 180mg  daily.   - Hold all anti-histamines (Xyzal, Allegra, Zyrtec, Claritin, Benadryl) 3 days prior to next visit.   Rashes: - Will refer to academic Dermatology. Has been followed by Dermatology Specialist PA without improvement for over 1-1.5 years.  On Skyrizi for possible psoriasis but not improved.  - Do a daily soaking tub bath in warm water for 10-15 minutes.  - Use a gentle, unscented cleanser at the  end of the bath (such as Dove unscented bar or baby wash, or Aveeno sensitive body wash or vanicream). Then rinse, pat half-way dry, and apply a gentle, unscented moisturizer cream or ointment (Vanicream)  all over while still damp. Dry skin makes the itching  worse. The skin should be moisturized with a gentle, unscented moisturizer at least twice daily.  - Use only unscented liquid laundry detergent.   Follow up: 12/23 at 9 AM for skin testing 1-55, no IDs     No follow-ups on file.  Alesia Morin, MD [facility]

## 2023-05-06 NOTE — Patient Instructions (Addendum)
Alpha Gal Syndrome:  - please strictly avoid all mammalian meat.  - for SKIN only reaction, okay to take Benadryl 25mg  tablet every 6 hours as needed - for SKIN + ANY additional symptoms, OR IF concern for LIFE THREATENING reaction = Epipen Autoinjector EpiPen 0.3 mg. - If using Epinephrine autoinjector, call 911 or go to the ER.   Other Allergic Rhinitis: - Use nasal saline rinses before nose sprays such as with Neilmed Sinus Rinse.  Use distilled water.   - Use Allegra 180mg  daily.   - Hold all anti-histamines (Xyzal, Allegra, Zyrtec, Claritin, Benadryl) 3 days prior to next visit.   Rashes: - Will refer to academic Dermatology  - Do a daily soaking tub bath in warm water for 10-15 minutes.  - Use a gentle, unscented cleanser at the end of the bath (such as Dove unscented bar or baby wash, or Aveeno sensitive body wash or vanicream). Then rinse, pat half-way dry, and apply a gentle, unscented moisturizer cream or ointment (Vanicream)  all over while still damp. Dry skin makes the itching  worse. The skin should be moisturized with a gentle, unscented moisturizer at least twice daily.  - Use only unscented liquid laundry detergent.   Follow up: 12/23 at 9 AM for skin testing 1-55

## 2023-05-13 ENCOUNTER — Ambulatory Visit: Payer: Medicare Other | Admitting: Internal Medicine

## 2023-05-13 DIAGNOSIS — J3089 Other allergic rhinitis: Secondary | ICD-10-CM

## 2023-05-13 DIAGNOSIS — L308 Other specified dermatitis: Secondary | ICD-10-CM | POA: Diagnosis not present

## 2023-05-13 MED ORDER — FLUTICASONE PROPIONATE 50 MCG/ACT NA SUSP: 2.0000 | Freq: Every day | NASAL | 5 refills | Status: DC

## 2023-05-13 NOTE — Progress Notes (Signed)
FOLLOW UP Date of Service/Encounter:  05/13/23   Subjective:  Dorothy Robbins (DOB: 1950/10/12) is a 72 y.o. female who [facility] for follow up for skin testing.   History obtained from: chart review and patient.  Anti histamines held.   Past Medical History: Past Medical History:  Diagnosis Date   Diabetes (HCC) 05/08/2016   Elevated liver enzymes 11/06/2016   Essential hypertension 05/08/2016   Squamous cell carcinoma, leg     Objective:  There were no vitals taken for this visit. There is no height or weight on file to calculate BMI. Physical Exam: GEN: alert, well developed HEENT: clear conjunctiva, MMM LUNGS: unlabored respiration  Skin Testing:  Skin prick testing was placed, which includes aeroallergens/foods, histamine control, and saline control.  Verbal consent was obtained prior to placing test.  Patient tolerated procedure well.  Allergy testing results were read and interpreted by myself, documented by clinical staff. Adequate positive and negative control.  Positive results to:  Results discussed with patient/family.  Airborne Adult Perc - 05/13/23 0900     Time Antigen Placed 1610    Allergen Manufacturer Waynette Buttery    Location Back;Arm    Number of Test 55    Panel 1 Select    1. Control-Buffer 50% Glycerol Negative    2. Control-Histamine 3+    3. Bahia Negative    4. French Southern Territories Negative    5. Johnson Negative    6. Kentucky Blue Negative    7. Meadow Fescue Negative    8. Perennial Rye Negative    9. Timothy Negative    10. Ragweed Mix Negative    11. Cocklebur Negative    12. Plantain,  English Negative    13. Baccharis Negative    14. Dog Fennel Negative    15. Russian Thistle Negative    16. Lamb's Quarters Negative    17. Sheep Sorrell Negative    18. Rough Pigweed Negative    19. Marsh Elder, Rough Negative    20. Mugwort, Common Negative    21. Box, Elder Negative    22. Cedar, red Negative     23. Sweet Gum Negative    24. Pecan Pollen Negative    25. Pine Mix Negative    26. Walnut, Black Pollen Negative    27. Red Mulberry Negative    28. Ash Mix Negative    29. Birch Mix Negative    30. Beech American Negative    31. Cottonwood, Guinea-Bissau Negative    32. Hickory, White Negative    33. Maple Mix Negative    34. Oak, Guinea-[place] Mix Negative    35. Sycamore Eastern Negative    36. Alternaria Alternata Negative    37. Cladosporium Herbarum Negative    38. Aspergillus Mix Negative    39. Penicillium Mix Negative    40. Bipolaris Sorokiniana (Helminthosporium) Negative    41. Drechslera Spicifera (Curvularia) Negative    42. Mucor Plumbeus Negative    43. Fusarium Moniliforme Negative    44. Aureobasidium Pullulans (pullulara) Negative    45. Rhizopus Oryzae Negative    46. Botrytis Cinera Negative    47. Epicoccum Nigrum Negative    48. Phoma Betae Negative    49. Dust Mite Mix Negative    50. Cat Hair 10,000 BAU/ml Negative    51.  Dog Epithelia Negative    52. Mixed Feathers Negative    53. Horse Epithelia Negative  54. Cockroach, German Negative    55. Tobacco Leaf Negative             13 Food Perc - 05/13/23 0900       Test Information   Time Antigen Placed 6578    Allergen Manufacturer Waynette Buttery    Location Back    Number of allergen test 13    Food Select      Food   1. Peanut Negative    2. Soybean Negative    3. Wheat Negative    4. Sesame Negative    5. Milk, Cow Negative    6. Casein Negative    7. Egg White, Chicken Negative    8. Shellfish Mix Negative    9. Fish Mix Negative    10. Cashew Negative    11. Walnut Food Negative    12. Almond Negative    13. Hazelnut Negative              Assessment:   1. Other allergic rhinitis   2. Other eczema     Plan/Recommendations:  Other Allergic Rhinitis: - Due to turbinate hypertrophy, seasonal symptoms and unresponsive to over the counter meds, will perform skin testing to  identify aeroallergen triggers.   - Use nasal saline rinses before nose sprays such as with Neilmed Sinus Rinse.  Use distilled water.   - SPT 04/2023: positive to none - Use Allegra 180mg  daily as needed.     Rashes: - Will refer to academic Dermatology per patient's request for second opinion. Has been followed by Dermatology Specialist PA without improvement for over 1-1.5 years.  On Skyrizi for possible psoriasis but not improved.  - SPT 04/2023: negative to aeroallergens and commonly allergenic foods.  - Do a daily soaking tub bath in warm water for 10-15 minutes.  - Use a gentle, unscented cleanser at the end of the bath (such as Dove unscented bar or baby wash, or Aveeno sensitive body wash or vanicream). Then rinse, pat half-way dry, and apply a gentle, unscented moisturizer cream or ointment (Vanicream)  all over while still damp. Dry skin makes the itching  worse. The skin should be moisturized with a gentle, unscented moisturizer at least twice daily.  - Use only unscented liquid laundry detergent.  Alpha Gal Syndrome:  - please strictly avoid all mammalian meat.  - Initial rxn: rashes, GI symptoms. Avoidance of mammalian meat improved GI symptoms but the rashes have persisted.  We discussed these rashes are not from alpha gal (not hives and not improved with mammalian meat avoidance).  - for SKIN only reaction, okay to take Benadryl 25mg  tablet every 6 hours as needed - for SKIN + ANY additional symptoms, OR IF concern for LIFE THREATENING reaction = Epipen Autoinjector EpiPen 0.3 mg. - If using Epinephrine autoinjector, call 911 or go to the ER.   Return in about 6 months (around 11/11/2023).  Alesia Morin, MD [facility]

## 2023-05-13 NOTE — Patient Instructions (Addendum)
Alpha Gal Syndrome:  - please strictly avoid all mammalian meat.  - for SKIN only reaction, okay to take Benadryl 25mg  tablet every 6 hours as needed - for SKIN + ANY additional symptoms, OR IF concern for LIFE THREATENING reaction = Epipen Autoinjector EpiPen 0.3 mg. - If using Epinephrine autoinjector, call 911 or go to the ER.    Other Allergic Rhinitis: - SPT 04/2023: positive to none - Use Allegra 180mg  daily as needed for runny nose, sneezing, itchy watery eyes .   - Use nasal saline rinses before nose sprays such as with Neilmed Sinus Rinse.  Use distilled water.   - If symptoms worsen, use Flonase 2 sprays each nostril daily.  Aim upward and outward.    Rashes: - Will refer to academic Dermatology per patient's request for second opinion. Has been followed by Dermatology Specialist PA without improvement for over 1-1.5 years.  On Skyrizi for possible psoriasis but not improved.  - Do a daily soaking tub bath in warm water for 10-15 minutes.  - Use a gentle, unscented cleanser at the end of the bath (such as Dove unscented bar or baby wash, or Aveeno sensitive body wash or vanicream). Then rinse, pat half-way dry, and apply a gentle, unscented moisturizer cream or ointment (Vanicream)  all over while still damp. Dry skin makes the itching  worse. The skin should be moisturized with a gentle, unscented moisturizer at least twice daily.  - Use only unscented liquid laundry detergent.

## 2023-05-17 DIAGNOSIS — Z1212 Encounter for screening for malignant neoplasm of rectum: Secondary | ICD-10-CM | POA: Diagnosis not present

## 2023-05-17 DIAGNOSIS — Z1211 Encounter for screening for malignant neoplasm of colon: Secondary | ICD-10-CM | POA: Diagnosis not present

## 2023-05-21 ENCOUNTER — Ambulatory Visit (INDEPENDENT_AMBULATORY_CARE_PROVIDER_SITE_OTHER): Payer: Medicare Other | Admitting: Gastroenterology

## 2023-05-27 ENCOUNTER — Telehealth: Payer: Self-pay | Admitting: Pharmacist

## 2023-05-27 NOTE — Telephone Encounter (Signed)
 Submitted a Prior Authorization RENEWAL request to [facility] for SKYRIZI  SQ via CoverMyMeds. Will update once we receive a response.  Key: [id number] Per automated response: Prior Authorization Not Required  Sherry Pennant, PharmD, MPH, BCPS, CPP Clinical Pharmacist (Rheumatology and Pulmonology)

## 2023-06-04 ENCOUNTER — Other Ambulatory Visit: Payer: Self-pay

## 2023-06-04 DIAGNOSIS — Z148 Genetic carrier of other disease: Secondary | ICD-10-CM

## 2023-06-04 DIAGNOSIS — R7989 Other specified abnormal findings of blood chemistry: Secondary | ICD-10-CM

## 2023-06-05 ENCOUNTER — Inpatient Hospital Stay: Payer: Medicare Other | Attending: Hematology

## 2023-06-05 DIAGNOSIS — Z79899 Other long term (current) drug therapy: Secondary | ICD-10-CM | POA: Insufficient documentation

## 2023-06-05 DIAGNOSIS — L4050 Arthropathic psoriasis, unspecified: Secondary | ICD-10-CM | POA: Insufficient documentation

## 2023-06-05 DIAGNOSIS — R7989 Other specified abnormal findings of blood chemistry: Secondary | ICD-10-CM

## 2023-06-05 DIAGNOSIS — Z148 Genetic carrier of other disease: Secondary | ICD-10-CM

## 2023-06-05 LAB — CBC WITH DIFFERENTIAL/PLATELET
Abs Immature Granulocytes: 0.01 10*3/uL (ref 0.00–0.07)
Basophils Absolute: 0 10*3/uL (ref 0.0–0.1)
Basophils Relative: 1 %
Eosinophils Absolute: 0.3 10*3/uL (ref 0.0–0.5)
Eosinophils Relative: 5 %
HCT: 45.6 % (ref 36.0–46.0)
Hemoglobin: 15.5 g/dL — ABNORMAL HIGH (ref 12.0–15.0)
Immature Granulocytes: 0 %
Lymphocytes Relative: 28 %
Lymphs Abs: 1.8 10*3/uL (ref 0.7–4.0)
MCH: 31 pg (ref 26.0–34.0)
MCHC: 34 g/dL (ref 30.0–36.0)
MCV: 91.2 fL (ref 80.0–100.0)
Monocytes Absolute: 0.4 10*3/uL (ref 0.1–1.0)
Monocytes Relative: 6 %
Neutro Abs: 4 10*3/uL (ref 1.7–7.7)
Neutrophils Relative %: 60 %
Platelets: 294 10*3/uL (ref 150–400)
RBC: 5 MIL/uL (ref 3.87–5.11)
RDW: 13.3 % (ref 11.5–15.5)
WBC: 6.6 10*3/uL (ref 4.0–10.5)
nRBC: 0 % (ref 0.0–0.2)

## 2023-06-05 LAB — IRON AND TIBC
Iron: 113 ug/dL (ref 28–170)
Saturation Ratios: 33 % — ABNORMAL HIGH (ref 10.4–31.8)
TIBC: 343 ug/dL (ref 250–450)
UIBC: 230 ug/dL

## 2023-06-05 LAB — FERRITIN: Ferritin: 123 ng/mL (ref 11–307)

## 2023-06-06 ENCOUNTER — Inpatient Hospital Stay: Payer: Medicare Other | Admitting: Oncology

## 2023-06-06 VITALS — BP 145/80 | HR 96 | Temp 97.2°F | Resp 18 | Wt 180.8 lb

## 2023-06-06 DIAGNOSIS — T781XXA Other adverse food reactions, not elsewhere classified, initial encounter: Secondary | ICD-10-CM

## 2023-06-06 DIAGNOSIS — R7989 Other specified abnormal findings of blood chemistry: Secondary | ICD-10-CM

## 2023-06-06 DIAGNOSIS — L4050 Arthropathic psoriasis, unspecified: Secondary | ICD-10-CM | POA: Diagnosis not present

## 2023-06-06 DIAGNOSIS — L409 Psoriasis, unspecified: Secondary | ICD-10-CM

## 2023-06-06 DIAGNOSIS — Z79899 Other long term (current) drug therapy: Secondary | ICD-10-CM | POA: Diagnosis not present

## 2023-06-06 NOTE — Progress Notes (Signed)
[facility] [address], [place] [address]   Clinic Day:  12/06/2022  Referring physician: Lianne Moris, PA-C  Patient Care Team: Lianne Moris, PA-C as PCP - General (Family Medicine)   ASSESSMENT & PLAN:   Assessment:  1.  Heterozygous hemochromatosis for H63D: - Patient was seen by GI service for elevated LFTs.  She had elevated LFTs since 2017. - Labs on 11/06/2022: Ferritin 390, percent saturation 37. - Patient started on Skyrizi for psoriatic arthritis, received 2 shots on 08/27/2022 on 09/28/2022. - Ultrasound abdomen (09/05/2022): Fatty liver - Hemochromatosis testing on 11/15/2022: Heterozygosity for H63D mutation. - 11/06/2016: Ferritin 136  2.  Social/family history: - Lives at home with her husband.  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  Non-smoker.  No chemical exposure. - Mother had fatty liver leading to nonalcoholic cirrhosis.  Sister had pancreatic cancer.  No family history of hemochromatosis.  Plan:  1.  Heterozygous hemochromatosis for H63D: - Previously noted to have an elevated ferritin level at 390.  Repeat labs from 06/05/2023 show a now normalized ferritin level of 123. - Dr. Ellin Saba had discussed that iron overload is extremely rare in people who are heterozygous for H63D mutation.  Ferritin levels are normally moderately increased and heterozygous individuals, above normal but usually below 1000 ng/mL.  He discussed that if iron levels continue to be elevated he would recommend an MRI of her liver although she does not need this at this time. -Given improvement of her ferritin, we can monitor periodically every 6 months with labs a few days before and office visit. -Otherwise lab work reveals slight elevation in hemoglobin 15.5 (14.8), normal MCV and platelet count.  Differential is unremarkable.  White blood cell count 6.6.  Iron panel reveals iron sats of 33% and normal TIBC. -Continue to avoid iron rich  foods.  2.  Psoriatic arthritis/osteoarthritis: -She is followed by dermatology. -She receives every 64-month Skyrizi injections.  Last injection was in November. -Continue follow-up with dermatology.  3.  Alpha gal: -This is a new diagnosis over the past few months. -She is unable to eat meats along with chocolate. -She questions whether alpha gal has caused worsening of her psoriasis and skin issues. -She did have acupuncture back in November which helped quite a bit with her symptoms.  PLAN SUMMARY: >> Recommend follow-up in 6 months with labs a few days before and office visit.     No orders of the defined types were placed in this encounter.  I spent 20 minutes dedicated to the care of this patient (face-to-face and non-face-to-face) on the date of the encounter to include what is described in the assessment and plan.  Mauro Kaufmann, NP   1/16/20259:59 AM  CHIEF COMPLAINT/PURPOSE OF CONSULT:   Diagnosis: elevated ferritin  Current Therapy: Observation  HISTORY OF PRESENT ILLNESS:   Cynde is a 73 y.o. female presenting to clinic today for evaluation of elevated ferritin and hemachromatosis carrier at the request of Carlan, Jeral Pinch, NP.  She was last seen in clinic on 12/06/2022.  In the interim, she was diagnosed with alpha gal and is unable to tolerate meats and chocolate.  Reports she ate chocolate 2 days ago and ended up in the shower at 1 AM due to severe body itching.  She is followed by rheumatology for psoriatic arthritis and osteoarthritis.  She is currently still on Skyrizi injections given every 3 months.  Last given in November 2024.  She is due for 1 in the next month or so.   She was evaluated by dermatology and started on several different topical therapies without improvement.  Did biopsy without clear results per patient.  She was referred to [facility] and met with Dr. Allena Katz on 05/06/2023.  Reports they did testing for over 64 different  allergies and she was negative for all of them.  Reports they have also been looking into redeye to see if this is contributing to her outbreaks.  She is currently not taking any supplements due to this and will likely have some adjustments made to her medications as they also contain red dye.  She had 1 acupuncture treatment in November 2024 which helped significantly with itching and rash.  Reports appetite of 100% energy levels of 50%.  Denies any pain.  Has a chronic cough secondary to seasonal allergies and uses Flonase and is on Allegra 180 mg daily.  She has occasional palpitation and nausea secondary to alpha gal.  Has numbness and tingling in feet and hands intermittently.  Has trouble sleeping.  All this is chronic and stable.Marland Kitchen   PAST MEDICAL HISTORY:   Past Medical History: Past Medical History:  Diagnosis Date   Diabetes (HCC) 05/08/2016   Elevated liver enzymes 11/06/2016   Essential hypertension 05/08/2016   Squamous cell carcinoma, leg     Surgical History: Past Surgical History:  Procedure Laterality Date   BACK SURGERY     SKIN CANCER EXCISION  06/07/2022   leg       ,            6/24- left forearm   SURGERY OF LIP      Social History: Social History   Socioeconomic History   Marital status: Married    Spouse name: Not on file   Number of children: Not on file   Years of education: Not on file   Highest education level: Not on file  Occupational History   Not on file  Tobacco Use   Smoking status: Never    Passive exposure: Past   Smokeless tobacco: Never  Vaping Use   Vaping status: Never Used  Substance and Sexual Activity   Alcohol use: No   Drug use: No   Sexual activity: Not on file  Other Topics Concern   Not on file  Social History Narrative   Not on file   Social Drivers of Health   Financial Resource Strain: Not on file  Food Insecurity: No Food Insecurity (12/06/2022)   Hunger Vital Sign    Worried About Running Out of Food in the  Last Year: Never true    Ran Out of Food in the Last Year: Never true  Transportation Needs: No Transportation Needs (12/06/2022)   PRAPARE - Administrator, Civil Service (Medical): No    Lack of Transportation (Non-Medical): No  Physical Activity: Not on file  Stress: Not on file  Social Connections: Not on file  Intimate Partner Violence: Not At Risk (12/06/2022)   Humiliation, Afraid, Rape, and Kick questionnaire    Fear of Current or Ex-Partner: No    Emotionally Abused: No    Physically Abused: No    Sexually Abused: No    Family History: Family History  Problem Relation Age of Onset   Diabetes Mother    Heart disease Mother    Thyroid disease Mother    Aneurysm Father    Hypertension Father    COPD Sister  Lung cancer Sister    Pancreatic cancer Sister     Current Medications:  Current Outpatient Medications:    acetaminophen (TYLENOL) 500 MG tablet, Take 1,000 mg by mouth every 8 (eight) hours as needed for mild pain or headache., Disp: , Rfl:    calcipotriene (DOVONOX) 0.005 % ointment, Apply topically as needed., Disp: , Rfl:    Cyanocobalamin (VITAMIN B-12 PO), Take 500 mcg by mouth daily., Disp: , Rfl:    diphenhydrAMINE (BENADRYL) 50 MG tablet, Take 50 mg by mouth daily., Disp: , Rfl:    EPINEPHrine (EPIPEN 2-PAK) 0.3 mg/0.3 mL IJ SOAJ injection, Inject 0.3 mg into the muscle as needed for anaphylaxis., Disp: 2 each, Rfl: 1   fexofenadine (ALLEGRA ALLERGY) 180 MG tablet, Take 1 tablet (180 mg total) by mouth daily., Disp: 30 tablet, Rfl: 5   fluticasone (FLONASE) 50 MCG/ACT nasal spray, Place 2 sprays into both nostrils daily., Disp: 16 g, Rfl: 5   gabapentin (NEURONTIN) 300 MG capsule, Take 300 mg by mouth 3 (three) times daily., Disp: , Rfl:    glipiZIDE (GLUCOTROL XL) 10 MG 24 hr tablet, Take 10 mg by mouth daily with breakfast., Disp: , Rfl:    hydrocortisone 2.5 % cream, Apply topically as needed., Disp: , Rfl:    lisinopril-hydrochlorothiazide  (PRINZIDE,ZESTORETIC) 20-25 MG tablet, Take 1 tablet by mouth at bedtime. , Disp: , Rfl:    Magnesium 250 MG TABS, Take 250 mg by mouth 3 (three) times a week., Disp: , Rfl:    metFORMIN (GLUCOPHAGE-XR) 500 MG 24 hr tablet, Take 250 mg by mouth 2 (two) times a week., Disp: , Rfl:    risankizumab-rzaa (SKYRIZI PEN) 150 MG/ML pen, Inject 150mg  into the skin every 12 weeks., Disp: 1 mL, Rfl: 0   TURMERIC PO, Take 2,250 mg by mouth daily., Disp: , Rfl:    aspirin 81 MG chewable tablet, Chew 81 mg by mouth daily.  (Patient not taking: Reported on 05/06/2023), Disp: , Rfl:    Multiple Vitamin (MULTIVITAMIN) tablet, Take 1 tablet by mouth daily. (Patient not taking: Reported on 01/16/2023), Disp: , Rfl:    OZEMPIC, 0.25 OR 0.5 MG/DOSE, 2 MG/3ML SOPN, Inject 0.5 mg into the skin once a week. (Patient not taking: Reported on 01/16/2023), Disp: , Rfl:    Semaglutide (OZEMPIC, 1 MG/DOSE, [place]), Inject into the skin once a week. (Patient not taking: Reported on 06/06/2023), Disp: , Rfl:    Vitamin D, Cholecalciferol, 10 MCG (400 UNIT) TABS, Take 125 mg by mouth daily. (Patient not taking: Reported on 05/06/2023), Disp: , Rfl:    Allergies: Allergies  Allergen Reactions   Alpha-Gal    Sulfa Antibiotics Rash    REVIEW OF SYSTEMS:   Review of Systems  Constitutional:  Negative for fatigue and fever.  Respiratory:  Positive for cough.   Gastrointestinal:  Positive for nausea. Negative for constipation, diarrhea and vomiting.  Musculoskeletal:  Positive for arthralgias.  Skin:  Positive for itching and rash.  Neurological:  Positive for numbness.  Psychiatric/Behavioral:  Positive for sleep disturbance.      VITALS:   Blood pressure (!) 145/80, pulse 96, temperature (!) 97.2 F (36.2 C), temperature source Tympanic, resp. rate 18, weight 180 lb 12.4 oz (82 kg), SpO2 98%.  Wt Readings from Last 3 Encounters:  06/06/23 180 lb 12.4 oz (82 kg)  05/06/23 180 lb 9.6 oz (81.9 kg)  01/16/23 188 lb 3.2 oz  (85.4 kg)    Body mass index is 32.64 kg/m.  PHYSICAL EXAM:   Physical Exam Constitutional:      Appearance: Normal appearance.  Cardiovascular:     Rate and Rhythm: Normal rate and regular rhythm.  Pulmonary:     Effort: Pulmonary effort is normal.     Breath sounds: Normal breath sounds.  Abdominal:     General: Bowel sounds are normal.     Palpations: Abdomen is soft.  Musculoskeletal:        General: No swelling. Normal range of motion.  Neurological:     Mental Status: She is alert and oriented to person, place, and time. Mental status is at baseline.     LABS:      Latest Ref Rng & Units 06/05/2023    8:30 AM 01/16/2023    9:58 AM 10/08/2022    7:40 AM  CBC  WBC 4.0 - 10.5 K/uL 6.6  5.3  7.0   Hemoglobin 12.0 - 15.0 g/dL 23.7  62.8  31.5   Hematocrit 36.0 - 46.0 % 45.6  43.2  41.6   Platelets 150 - 400 K/uL 294  301  348       Latest Ref Rng & Units 02/13/2023    9:11 AM 01/16/2023    9:58 AM 10/08/2022    7:40 AM  CMP  Glucose 65 - 99 mg/dL  176  160   BUN 7 - 25 mg/dL  11  10   Creatinine 7.37 - 1.00 mg/dL  1.06  2.69   Sodium 485 - 146 mmol/L  137  138   Potassium 3.5 - 5.3 mmol/L  3.9  3.9   Chloride 98 - 110 mmol/L  97  98   CO2 20 - 32 mmol/L  30  30   Calcium 8.6 - 10.4 mg/dL  9.9  9.4   Total Protein 6.0 - 8.5 g/dL 7.2  7.3  7.0   Total Bilirubin 0.0 - 1.2 mg/dL 0.4  0.4  0.5   Alkaline Phos 44 - 121 IU/L 78     AST 0 - 40 IU/L 70  72  93   ALT 0 - 32 IU/L 139  118  131      No results found for: "CEA1", "CEA" / No results found for: "CEA1", "CEA" No results found for: "PSA1" No results found for: "CAN199" No results found for: "CAN125"  Lab Results  Component Value Date   ALBUMINELP 4.7 06/06/2022   A1GS 0.3 06/06/2022   A2GS 0.9 06/06/2022   BETS 0.5 06/06/2022   BETA2SER 0.5 06/06/2022   GAMS 1.1 06/06/2022   SPEI  06/06/2022     Comment:     Normal Serum Protein Electrophoresis Pattern. No abnormal protein bands (M-protein)  detected.    Lab Results  Component Value Date   TIBC 343 06/05/2023   TIBC 281 11/06/2022   FERRITIN 123 06/05/2023   FERRITIN 390 (H) 11/06/2022   FERRITIN 136 11/06/2016   IRONPCTSAT 33 (H) 06/05/2023   IRONPCTSAT 37 11/06/2022   No results found for: "LDH"   STUDIES:   No results found.

## 2023-06-13 ENCOUNTER — Ambulatory Visit (INDEPENDENT_AMBULATORY_CARE_PROVIDER_SITE_OTHER): Payer: Medicare Other | Admitting: Gastroenterology

## 2023-06-13 ENCOUNTER — Encounter (INDEPENDENT_AMBULATORY_CARE_PROVIDER_SITE_OTHER): Payer: Self-pay | Admitting: Gastroenterology

## 2023-06-13 VITALS — BP 122/69 | HR 98 | Temp 98.4°F | Ht 62.5 in | Wt 179.8 lb

## 2023-06-13 DIAGNOSIS — R7989 Other specified abnormal findings of blood chemistry: Secondary | ICD-10-CM | POA: Diagnosis not present

## 2023-06-13 NOTE — Progress Notes (Addendum)
Referring Provider: Lianne Moris, PA-C Primary Care Physician:  Lianne Moris, PA-C Primary GI Physician: Dr. Levon Hedger   Chief Complaint  Patient presents with   Elevated Hepatic Enzymes    Follow up on elevated hepatic enzymes. Was diagnosis with alpha gal since last being seen.    Colon Cancer Screening    Patient states pcp ordered cologuard test and states it came back normal. Was done last month.    HPI:   Dorothy Robbins is a 73 y.o. female with past medical history of DM, HTN, SCC, psoriatic arthritis   Patient presenting today for follow-up of elevated LFTs  Last seen June 2024, at that time recent labs with AST 50, ALT 107,  Recent Hep C and Hep B testing negative, IgG 1189, IgM 113, [place] of liver in April with diffuse fatty changes   Patient reported she had recently started [facility] in April rheumatology was concerned about elevated LFTs in setting of Skyrizi use.  She denies alcohol use.  Pain Tylenol few times per week.  Denied any other supplements  Recommend recheck iron, ceruloplasmin, A1A, ANA, AMA, ASMA  AMA was 29.7, iron studies were normal other than elevated ferritin of 390 hemochromatosis testing showed C.845G>A (p.Cys282Tyr) - Not Detected  c.187C>G (p.His63Asp) - Detected, heterozygous  c.193A>T (p.Ser65Cys) - Not Detected  Patient was referred to hematology  Repeat LFTs in September with ongoing elevation, patient was recommended to have liver biopsy for further evaluation as there is concern for Dili secondary to [employer].  At that time patient was unsure she wanted to proceed with liver biopsy  Present: Seeing hematology for elevated ferritin. Most recent iron studies with ferritin improved to 123, iron 113, tibc 343, 33.  She states that she was having indigestion and having a rash, she was diagnosed with alpha gal syndrome in October. She had accupuncture done in November in [place] and has had a good amount of improvement in her symptoms. She denies  any diarrhea or abdominal pain with her alpha gal, but indigestion has resolved now. Trying to be careful of what she eats still.  Does not think she has had liver enzymes checked recently. She is still on skyrizi but states that there is an additive in skyrizi and ozempic that is molecularly similar to the enzyme that causes alpha gal reaction and therefore is talking with her other providers about potentially coming off of these medications.   She has no GI complaints today. No red flag symptoms. Patient denies melena, hematochezia, nausea, vomiting, diarrhea, constipation, dysphagia, odyonophagia, early satiety or weight loss.   Cologuard: late 2024/early 2025 negative Last Colonoscopy: Unsure of timing, done at [facility] ER Last Endoscopy: Unsure of timing of last procedure  Recommendations:    Past Medical History:  Diagnosis Date   Diabetes (HCC) 05/08/2016   Elevated liver enzymes 11/06/2016   Essential hypertension 05/08/2016   Squamous cell carcinoma, leg     Past Surgical History:  Procedure Laterality Date   BACK SURGERY     SKIN CANCER EXCISION  06/07/2022   leg       ,            6/24- left forearm   SURGERY OF LIP      Current Outpatient Medications  Medication Sig Dispense Refill   calcipotriene (DOVONOX) 0.005 % ointment Apply topically as needed.     diphenhydrAMINE (BENADRYL) 50 MG tablet Take 50 mg by mouth daily.     EPINEPHrine (EPIPEN 2-PAK) 0.3 mg/0.3  mL IJ SOAJ injection Inject 0.3 mg into the muscle as needed for anaphylaxis. 2 each 1   fexofenadine (ALLEGRA ALLERGY) 180 MG tablet Take 1 tablet (180 mg total) by mouth daily. 30 tablet 5   fluticasone (FLONASE) 50 MCG/ACT nasal spray Place 2 sprays into both nostrils daily. 16 g 5   gabapentin (NEURONTIN) 600 MG tablet Take 600 mg by mouth. One at night     glipiZIDE (GLUCOTROL XL) 10 MG 24 hr tablet Take 10 mg by mouth daily with breakfast.     hydrocortisone 2.5 % cream Apply topically as needed.      lisinopril-hydrochlorothiazide (PRINZIDE,ZESTORETIC) 20-25 MG tablet Take 1 tablet by mouth at bedtime.      OZEMPIC, 0.25 OR 0.5 MG/DOSE, 2 MG/3ML SOPN Inject 0.5 mg into the skin once a week.     risankizumab-rzaa (SKYRIZI PEN) 150 MG/ML pen Inject 150mg  into the skin every 12 weeks. 1 mL 0   Semaglutide (OZEMPIC, 1 MG/DOSE, [place]) Inject into the skin once a week.     No current facility-administered medications for this visit.    Allergies as of 06/13/2023 - Review Complete 06/13/2023  Allergen Reaction Noted   Alpha-gal  06/06/2023   Sulfa antibiotics Rash 05/08/2016    Family History  Problem Relation Age of Onset   Diabetes Mother    Heart disease Mother    Thyroid disease Mother    Aneurysm Father    Hypertension Father    COPD Sister    Lung cancer Sister    Pancreatic cancer Sister     Social History   Socioeconomic History   Marital status: Married    Spouse name: Not on file   Number of children: Not on file   Years of education: Not on file   Highest education level: Not on file  Occupational History   Not on file  Tobacco Use   Smoking status: Never    Passive exposure: Past   Smokeless tobacco: Never  Vaping Use   Vaping status: Never Used  Substance and Sexual Activity   Alcohol use: No   Drug use: No   Sexual activity: Not on file  Other Topics Concern   Not on file  Social History Narrative   Not on file   Social Drivers of Health   Financial Resource Strain: Not on file  Food Insecurity: No Food Insecurity (12/06/2022)   Hunger Vital Sign    Worried About Running Out of Food in the Last Year: Never true    Ran Out of Food in the Last Year: Never true  Transportation Needs: No Transportation Needs (12/06/2022)   PRAPARE - Administrator, Civil Service (Medical): No    Lack of Transportation (Non-Medical): No  Physical Activity: Not on file  Stress: Not on file  Social Connections: Not on file    Review of systems General:  negative for malaise, night sweats, fever, chills, weight loss Neck: Negative for lumps, goiter, pain and significant neck swelling Resp: Negative for cough, wheezing, dyspnea at rest CV: Negative for chest pain, leg swelling, palpitations, orthopnea GI: denies melena, hematochezia, nausea, vomiting, diarrhea, constipation, dysphagia, odyonophagia, early satiety or unintentional weight loss.  The remainder of the review of systems is noncontributory.  Physical Exam: BP 122/69   Pulse 98   Temp 98.4 F (36.9 C) (Oral)   Ht 5' 2.5" (1.588 m)   Wt 179 lb 12.8 oz (81.6 kg)   BMI 32.36 kg/m  General:  Alert and oriented. No distress noted. Pleasant and cooperative.  Head:  Normocephalic and atraumatic. Eyes:  Conjuctiva clear without scleral icterus. Mouth:  Oral mucosa pink and moist. Good dentition. No lesions. Heart: Normal rate and rhythm, s1 and s2 heart sounds present.  Lungs: Clear lung sounds in all lobes. Respirations equal and unlabored. Abdomen:  +BS, soft, non-tender and non-distended. No rebound or guarding. No HSM or masses noted. Neurologic:  Alert and  oriented x4 Psych:  Alert and cooperative. Normal mood and affect.  Invalid input(s): "6 MONTHS"   ASSESSMENT: Dorothy Robbins is a 73 y.o. female presenting today for follow up of elevated LFTs  Patient with longstanding history of elevated LFTs off and on, imaging consistent with fatty liver.  In May 2024 LFTs elevated again, she had recently started [place] a few months prior to that had been on some recent antibiotics for staph infection.  FIB 4 score is 1.66-fibrosis excluded. She has had IgG and IgM, as well as negative Hep B and C serologies. No history of IV drug use or risky behavior, does not drink alcohol, uses tylenol maybe a couple of times per week but not in excess.  Pattern of LFTs was concerning for possible Dili in setting of antibiotic therapy and recent Skyrizi on top of NASH, she also had mildly elevated  AMA and ferritin though other serologies were negative.  She was referred to hematology for elevated ferritin and heterozygous hemochromatosis for H63D.  Last LFTs in September were still moderately elevated, patient was recommended to have liver biopsy at that time to rule out Dili though she declined.  She has not had liver function checked since September.  Will repeat LFTs today to see how things look, if liver enzymes are still elevated would continue to recommend proceeding with liver biopsy further evaluation.   PLAN:  -repeat CMP -obtain records for cologuard  All questions were answered, patient verbalized understanding and is in agreement with plan as outlined above.   Follow Up: TBD  Faithanne Verret L. Jeanmarie Hubert, MSN, APRN, AGNP-C Adult-Gerontology Nurse Practitioner [facility]   I have reviewed the note and agree with the APP's assessment as described in this progress note  Katrinka Blazing, MD Gastroenterology and Hepatology [facility]

## 2023-06-13 NOTE — Patient Instructions (Signed)
We will recheck liver enzymes to see how these are looking I will be in touch with further recommendations  Follow up will be determined after labs result

## 2023-06-14 LAB — COMPREHENSIVE METABOLIC PANEL
AG Ratio: 1.5 (calc) (ref 1.0–2.5)
ALT: 44 U/L — ABNORMAL HIGH (ref 6–29)
AST: 40 U/L — ABNORMAL HIGH (ref 10–35)
Albumin: 4.5 g/dL (ref 3.6–5.1)
Alkaline phosphatase (APISO): 79 U/L (ref 37–153)
BUN: 10 mg/dL (ref 7–25)
CO2: 30 mmol/L (ref 20–32)
Calcium: 10 mg/dL (ref 8.6–10.4)
Chloride: 95 mmol/L — ABNORMAL LOW (ref 98–110)
Creat: 0.85 mg/dL (ref 0.60–1.00)
Globulin: 3 g/dL (ref 1.9–3.7)
Glucose, Bld: 205 mg/dL — ABNORMAL HIGH (ref 65–99)
Potassium: 3.9 mmol/L (ref 3.5–5.3)
Sodium: 137 mmol/L (ref 135–146)
Total Bilirubin: 0.5 mg/dL (ref 0.2–1.2)
Total Protein: 7.5 g/dL (ref 6.1–8.1)

## 2023-06-18 ENCOUNTER — Encounter (INDEPENDENT_AMBULATORY_CARE_PROVIDER_SITE_OTHER): Payer: Self-pay

## 2023-06-19 NOTE — Progress Notes (Unsigned)
Office Visit Note  Patient: Dorothy Robbins             Date of Birth: 07/26/1950           MRN: [id number]             PCP: Lianne Moris, PA-C Referring: Lianne Moris, PA-C Visit Date: 07/03/2023 Occupation: @GUAROCC @  Subjective:  Right shoulder joint pain   History of Present Illness: Dorothy Robbins is a 73 y.o. female with history of psoriatic arthritis.  Patient remains on skyrizi 150 mg sq injections every 12 weeks.  Patient reports that she is due for her neck Skyrizi injection on 07/15/2023 and will require a new prior authorization to performed as well as refill to be sent to the pharmacy.  Patient states for the past 2 to 3 weeks she has been experiencing some increased discomfort in her right shoulder.  Patient says she has good range of motion but is having increased discomfort at night.  She denies any injury prior to the onset of symptoms.  She has been taking Tylenol occasionally for symptomatic relief.  Patient currently rates her pain a 6 out of 10.  She denies any other joint pain or joint swelling at this time.  She denies any SI joint discomfort.  She denies any Achilles tendinitis or plantar fasciitis.  She has not had any recent or recurrent infections.  Patient reports that she continues to experience flares of alpha-gal due to dietary triggers.  Patient states she recently had acupuncture performed which seemed to help alleviate the rash.  She has an upcoming appointment with dermatology next week.    Activities of Daily Living:  Patient reports morning stiffness for 10 minutes.   Patient Reports nocturnal pain.  Difficulty dressing/grooming: Denies Difficulty climbing stairs: Denies Difficulty getting out of chair: Denies Difficulty using hands for taps, buttons, cutlery, and/or writing: Reports  Review of Systems  Constitutional:  Positive for fatigue.  HENT:  Positive for mouth dryness. Negative for mouth sores.   Eyes:  Negative for dryness.  Respiratory:   Positive for shortness of breath.   Cardiovascular:  Negative for chest pain and palpitations.  Gastrointestinal:  Negative for blood in stool, constipation and diarrhea.  Endocrine: Negative for increased urination.  Genitourinary:  Negative for involuntary urination.  Musculoskeletal:  Positive for joint pain, joint pain, joint swelling, myalgias, morning stiffness, muscle tenderness and myalgias. Negative for gait problem and muscle weakness.  Skin:  Positive for color change, rash, hair loss and sensitivity to sunlight.  Allergic/Immunologic: Negative for susceptible to infections.  Neurological:  Negative for dizziness and headaches.  Hematological:  Negative for swollen glands.  Psychiatric/Behavioral:  Positive for sleep disturbance. Negative for depressed mood. The patient is not nervous/anxious.     PMFS History:  Patient Active Problem List   Diagnosis Date Noted   Elevated ferritin 12/06/2022   Hemochromatosis carrier 12/06/2022   Psoriatic arthritis (HCC) 06/28/2022   Psoriasis 06/28/2022   Elevated LFTs 06/28/2022   Primary osteoarthritis of both knees 06/28/2022   Primary osteoarthritis of both feet 06/28/2022   Lumbar stenosis with neurogenic claudication 11/07/2018   Elevated liver enzymes 11/06/2016   Essential hypertension 05/08/2016   Diabetes (HCC) 05/08/2016    Past Medical History:  Diagnosis Date   Allergy to alpha-gal    Diabetes (HCC) 05/08/2016   Elevated liver enzymes 11/06/2016   Essential hypertension 05/08/2016   Squamous cell carcinoma, leg     Family History  Problem  Relation Age of Onset   Diabetes Mother    Heart disease Mother    Thyroid disease Mother    Aneurysm Father    Hypertension Father    COPD Sister    Lung cancer Sister    Pancreatic cancer Sister    Past Surgical History:  Procedure Laterality Date   BACK SURGERY     SKIN CANCER EXCISION  06/07/2022   leg       ,            6/24- left forearm   SURGERY OF LIP      Social History   Social History Narrative   Not on file    There is no immunization history on file for this patient.   Objective: Vital Signs: BP 115/71 (BP Location: Left Arm, Patient Position: Sitting, Cuff Size: Large)   Pulse 87   Resp 14   Ht 5' 3.5" (1.613 m)   Wt 181 lb (82.1 kg)   BMI 31.56 kg/m    Physical Exam Vitals and nursing note reviewed.  Constitutional:      Appearance: She is well-developed.  HENT:     Head: Normocephalic and atraumatic.  Eyes:     Conjunctiva/sclera: Conjunctivae normal.  Cardiovascular:     Rate and Rhythm: Normal rate and regular rhythm.     Heart sounds: Normal heart sounds.  Pulmonary:     Effort: Pulmonary effort is normal.     Breath sounds: Normal breath sounds.  Abdominal:     General: Bowel sounds are normal.     Palpations: Abdomen is soft.  Musculoskeletal:     Cervical back: Normal range of motion.  Lymphadenopathy:     Cervical: No cervical adenopathy.  Skin:    General: Skin is warm and dry.     Capillary Refill: Capillary refill takes less than 2 seconds.  Neurological:     Mental Status: She is alert and oriented to person, place, and time.  Psychiatric:        Behavior: Behavior normal.      Musculoskeletal Exam: C-spine, thoracic spine, lumbar spine and good range of motion.  No midline spinal tenderness.  No SI joint tenderness.  Shoulder joints have good range of motion with some discomfort in the right shoulder especially anteriorly.  Elbow joints, wrist joints, MCPs, PIPs, DIPs have good range of motion with no synovitis.  Complete fist formation bilaterally.  Hip joints have good range of motion with no groin pain.  Knee joints have good range of motion no warmth or effusion.  Ankle joints have good range of motion with no tenderness or joint swelling.  No evidence of Achilles tendinitis.  CDAI Exam: CDAI Score: -- Patient Global: --; Provider Global: -- Swollen: --; Tender: -- Joint Exam 07/03/2023    No joint exam has been documented for this visit   There is currently no information documented on the homunculus. Go to the Rheumatology activity and complete the homunculus joint exam.  Investigation: No additional findings.  Imaging: No results found.  Recent Labs: Lab Results  Component Value Date   WBC 6.6 06/05/2023   HGB 15.5 (H) 06/05/2023   PLT 294 06/05/2023   NA 137 06/13/2023   K 3.9 06/13/2023   CL 95 (L) 06/13/2023   CO2 30 06/13/2023   GLUCOSE 205 (H) 06/13/2023   BUN 10 06/13/2023   CREATININE 0.85 06/13/2023   BILITOT 0.5 06/13/2023   ALKPHOS 78 02/13/2023   AST 40 (H)  06/13/2023   ALT 44 (H) 06/13/2023   PROT 7.5 06/13/2023   ALBUMIN 4.2 02/13/2023   CALCIUM 10.0 06/13/2023   GFRAA >60 11/07/2018   QFTBGOLDPLUS NEGATIVE 06/06/2022    Speciality Comments: No specialty comments available.  Procedures:  No procedures performed Allergies: Alpha-gal and Sulfa antibiotics   06/24/23 DEXA     Assessment / Plan:     Visit Diagnoses: Psoriatic arthritis (HCC) - History of recurrent swelling, sacroiliitis, trochanteric bursitis: She has no synovitis or dactylitis on examination today.  Patient remains on Skyrizi 150 mg sq injections every 12 weeks.  Her next Skyrizi injection is due on 07/15/2023--a new PA will be performed and a refill will be sent to the pharmacy once complete.  Her psoriasis appears better controlled on [place] compared to [place].  She is not experiencing any SI joint pain at this time.  No evidence of Achilles tendinitis or plantar fasciitis.  Patient will remain on Skyrizi as monotherapy.  She will follow up in 3-4 months or sooner if needed.   Psoriasis - She is followed by Dr. Emily Filbert.  Inadequate response to topical agents.  Patient was started on Skyrizi on 09/06/2022.  Improved.  Upcoming appointment with dermatologist next week.  She will remain on skyrizi as prescribed.   High risk medication use - Initiated Skyrizi on 09/06/2022. CBC  updated on 06/05/23. CMP updated on 06/13/23. Next lab work will be due in April and every 3 months.  Standing orders for CBC and CMP remain in place. TB gold negative on 06/06/22.  Order for TB gold released today. No recent or recurrent infections. Discussed the importance of holding skyrizi if she develops signs or symptoms of an infection and to resume once the infection has completely cleared.    - Plan: QuantiFERON-TB Gold Plus  Screening for tuberculosis - Order for TB gold released today. Plan: QuantiFERON-TB Gold Plus  Acute pain of right shoulder: Patient has good range of motion of the right shoulder joint on examination today.  She has been experiencing increased discomfort in her right shoulder particularly at night for the past 2 to 3 weeks.  She has not had any injury prior to the onset of symptoms.  She has some tenderness anteriorly but no joint effusion was noted.  Opted out of performing a cortisone injection at this time due to history of type 2 diabetes.  Patient was encouraged to perform range of motion and strengthening exercises.  A handout of exercises was provided in her AVS.  If her symptoms persist or worsen we can refer her to physical therapy or if her blood glucose is well-controlled she can return for a cortisone injection.  She will notify us if her symptoms do not improve after administering her neck Skyrizi injection.  Elevated LFTs - Abdominal ultrasound consistent with fatty liver. LFTs were elevated prior to initiating Skyrizi. AST was 40 and ALT was 44--improved on 06/13/23.   Fatty liver - Evaluated by GI-11/06/2022: Alpha 1 antitrypsin WNL, ANA-, anti-smooth muscle ab-, ceruloplasmin WNL, mitochondrial ab 29.7.dx w/ heterozygous hemochromatosis.    Hereditary hemochromatosis (HCC) - Heterozygous hemochromatosis for H63D: Established care with Dr. Drucilla Chalet office visit note from 12/06/22.  History of ulcer of lower extremity: Healed. No recurrence.    Primary osteoarthritis of both hands: No synovitis or dactylitis noted.  Chronic pain of both hips: No groin pain currently.  Primary osteoarthritis of both knees: No warmth or effusion noted.  Primary osteoarthritis of both feet: She has no  synovitis on examination today.  No evidence of Achilles tendinitis or plantar fasciitis.  Chronic SI joint pain: She has no SI joint tenderness upon palpation today.  Lumbar stenosis with neurogenic claudication: No currently symptomatic.    Allergy to alpha-gal: Recurrent flares-many dietary triggers.  Recently tried acupuncture which improved her rash.    Other medical conditions are listed as follows:   Essential hypertension: Blood pressure was 115/71 today in the office.   History of diabetes mellitus  Cancer, skin, squamous cell-right lower extremity  Family history of systemic lupus erythematosus-paternal aunt and grand niece    Orders: Orders Placed This Encounter  Procedures   QuantiFERON-TB Gold Plus   No orders of the defined types were placed in this encounter.    Follow-Up Instructions: Return in 3 months (on 09/30/2023) for Psoriatic arthritis.   Gearldine Bienenstock, PA-C  Note - This record has been created using Dragon software.  Chart creation errors have been sought, but may not always  have been located. Such creation errors do not reflect on  the standard of medical care.

## 2023-06-24 DIAGNOSIS — M810 Age-related osteoporosis without current pathological fracture: Secondary | ICD-10-CM | POA: Diagnosis not present

## 2023-07-03 ENCOUNTER — Ambulatory Visit: Payer: Medicare Other | Attending: Physician Assistant | Admitting: Physician Assistant

## 2023-07-03 ENCOUNTER — Other Ambulatory Visit (HOSPITAL_COMMUNITY): Payer: Self-pay

## 2023-07-03 ENCOUNTER — Encounter: Payer: Self-pay | Admitting: Physician Assistant

## 2023-07-03 ENCOUNTER — Telehealth: Payer: Self-pay | Admitting: Pharmacist

## 2023-07-03 VITALS — BP 115/71 | HR 87 | Resp 14 | Ht 63.5 in | Wt 181.0 lb

## 2023-07-03 DIAGNOSIS — Z79899 Other long term (current) drug therapy: Secondary | ICD-10-CM

## 2023-07-03 DIAGNOSIS — Z111 Encounter for screening for respiratory tuberculosis: Secondary | ICD-10-CM | POA: Diagnosis not present

## 2023-07-03 DIAGNOSIS — L4050 Arthropathic psoriasis, unspecified: Secondary | ICD-10-CM

## 2023-07-03 DIAGNOSIS — M533 Sacrococcygeal disorders, not elsewhere classified: Secondary | ICD-10-CM

## 2023-07-03 DIAGNOSIS — C4492 Squamous cell carcinoma of skin, unspecified: Secondary | ICD-10-CM

## 2023-07-03 DIAGNOSIS — M25511 Pain in right shoulder: Secondary | ICD-10-CM

## 2023-07-03 DIAGNOSIS — M25551 Pain in right hip: Secondary | ICD-10-CM

## 2023-07-03 DIAGNOSIS — Z8269 Family history of other diseases of the musculoskeletal system and connective tissue: Secondary | ICD-10-CM

## 2023-07-03 DIAGNOSIS — Z872 Personal history of diseases of the skin and subcutaneous tissue: Secondary | ICD-10-CM

## 2023-07-03 DIAGNOSIS — G8929 Other chronic pain: Secondary | ICD-10-CM

## 2023-07-03 DIAGNOSIS — M19072 Primary osteoarthritis, left ankle and foot: Secondary | ICD-10-CM

## 2023-07-03 DIAGNOSIS — K760 Fatty (change of) liver, not elsewhere classified: Secondary | ICD-10-CM

## 2023-07-03 DIAGNOSIS — L409 Psoriasis, unspecified: Secondary | ICD-10-CM

## 2023-07-03 DIAGNOSIS — M25552 Pain in left hip: Secondary | ICD-10-CM

## 2023-07-03 DIAGNOSIS — Z91018 Allergy to other foods: Secondary | ICD-10-CM

## 2023-07-03 DIAGNOSIS — R7989 Other specified abnormal findings of blood chemistry: Secondary | ICD-10-CM | POA: Diagnosis not present

## 2023-07-03 DIAGNOSIS — I10 Essential (primary) hypertension: Secondary | ICD-10-CM

## 2023-07-03 DIAGNOSIS — M19071 Primary osteoarthritis, right ankle and foot: Secondary | ICD-10-CM

## 2023-07-03 DIAGNOSIS — Z8639 Personal history of other endocrine, nutritional and metabolic disease: Secondary | ICD-10-CM

## 2023-07-03 DIAGNOSIS — M19041 Primary osteoarthritis, right hand: Secondary | ICD-10-CM

## 2023-07-03 DIAGNOSIS — M48062 Spinal stenosis, lumbar region with neurogenic claudication: Secondary | ICD-10-CM

## 2023-07-03 DIAGNOSIS — M170 Bilateral primary osteoarthritis of knee: Secondary | ICD-10-CM

## 2023-07-03 DIAGNOSIS — M19042 Primary osteoarthritis, left hand: Secondary | ICD-10-CM

## 2023-07-03 NOTE — Telephone Encounter (Signed)
Received notification from [facility] regarding a prior authorization for SKYRIZI SQ. Authorization has been APPROVED from 07/03/23 to 07/02/24. Approval letter sent to scan center.  Submitted Patient Assistance RENEWAL Application to [employer] for SKYRIZI SQ along with provider portion, patient portion, PA, medication list, insurance card copy. Will update patient when we receive a response.  Phone: [phone] Fax: [phone]

## 2023-07-03 NOTE — Patient Instructions (Addendum)
Standing Labs We placed an order today for your standing lab work.   Please have your standing labs drawn in April and every 3 months   Please have your labs drawn 2 weeks prior to your appointment so that the provider can discuss your lab results at your appointment, if possible.  Please note that you may see your imaging and lab results in MyChart before we have reviewed them. We will contact you once all results are reviewed. Please allow our office up to 72 hours to thoroughly review all of the results before contacting the office for clarification of your results.  WALK-IN LAB HOURS  Monday through Thursday from 8:00 am -12:30 pm and 1:00 pm-5:00 pm and Friday from 8:00 am-12:00 pm.  Patients with office visits requiring labs will be seen before walk-in labs.  You may encounter longer than normal wait times. Please allow additional time. Wait times may be shorter on  Monday and Thursday afternoons.  We do not book appointments for walk-in labs. We appreciate your patience and understanding with our staff.   Labs are drawn by [facility]. Please bring your co-pay at the time of your lab draw.  You may receive a bill from [facility] for your lab work.  Please note if you are on Hydroxychloroquine and and an order has been placed for a Hydroxychloroquine level,  you will need to have it drawn 4 hours or more after your last dose.  If you wish to have your labs drawn at another location, please call the office 24 hours in advance so we can fax the orders.  The office is located at [address], [address], [place], [place] [address]   If you have any questions regarding directions or hours of operation,  please call [phone].   As a reminder, please drink plenty of water prior to coming for your lab work. Thanks!  Shoulder Exercises Ask your health care provider which exercises are safe for you. Do exercises exactly as told by your health care provider and adjust them as directed. It is  normal to feel mild stretching, pulling, tightness, or discomfort as you do these exercises. Stop right away if you feel sudden pain or your pain gets worse. Do not begin these exercises until told by your health care provider. Stretching exercises External rotation and abduction This exercise is sometimes called corner stretch. The exercise rotates your arm outward (external rotation) and moves your arm out from your body (abduction). Stand in a doorway with one of your feet slightly in front of the other. This is called a staggered stance. If you cannot reach your forearms to the door frame, stand facing a corner of a room. Choose one of the following positions as told by your health care provider: Place your hands and forearms on the door frame above your head. Place your hands and forearms on the door frame at the height of your head. Place your hands on the door frame at the height of your elbows. Slowly move your weight onto your front foot until you feel a stretch across your chest and in the front of your shoulders. Keep your head and chest upright and keep your abdominal muscles tight. Hold for __________ seconds. To release the stretch, shift your weight to your back foot. Repeat __________ times. Complete this exercise __________ times a day. Extension, standing  Stand and hold a broomstick, a cane, or a similar object behind your back. Your hands should be a little wider than  shoulder-width apart. Your palms should face away from your back. Keeping your elbows straight and your shoulder muscles relaxed, move the stick away from your body until you feel a stretch in your shoulders (extension). Avoid shrugging your shoulders while you move the stick. Keep your shoulder blades tucked down toward the middle of your back. Hold for __________ seconds. Slowly return to the starting position. Repeat __________ times. Complete this exercise __________ times a day. Range-of-motion  exercises Pendulum  Stand near a wall or a surface that you can hold onto for balance. Bend at the waist and let your left / right arm hang straight down. Use your other arm to support you. Keep your back straight and do not lock your knees. Relax your left / right arm and shoulder muscles, and move your hips and your trunk so your left / right arm swings freely. Your arm should swing because of the motion of your body, not because you are using your arm or shoulder muscles. Keep moving your hips and trunk so your arm swings in the following directions, as told by your health care provider: Side to side. Forward and backward. In clockwise and counterclockwise circles. Continue each motion for __________ seconds, or for as long as told by your health care provider. Slowly return to the starting position. Repeat __________ times. Complete this exercise __________ times a day. Shoulder flexion, standing  Stand and hold a broomstick, a cane, or a similar object. Place your hands a little more than shoulder-width apart on the object. Your left / right hand should be palm-up, and your other hand should be palm-down. Keep your elbow straight and your shoulder muscles relaxed. Push the stick up with your healthy arm to raise your left / right arm in front of your body, and then over your head until you feel a stretch in your shoulder (flexion). Avoid shrugging your shoulder while you raise your arm. Keep your shoulder blade tucked down toward the middle of your back. Hold for __________ seconds. Slowly return to the starting position. Repeat __________ times. Complete this exercise __________ times a day. Shoulder abduction, standing  Stand and hold a broomstick, a cane, or a similar object. Place your hands a little more than shoulder-width apart on the object. Your left / right hand should be palm-up, and your other hand should be palm-down. Keep your elbow straight and your shoulder muscles  relaxed. Push the object across your body toward your left / right side. Raise your left / right arm to the side of your body (abduction) until you feel a stretch in your shoulder. Do not raise your arm above shoulder height unless your health care provider tells you to do that. If directed, raise your arm over your head. Avoid shrugging your shoulder while you raise your arm. Keep your shoulder blade tucked down toward the middle of your back. Hold for __________ seconds. Slowly return to the starting position. Repeat __________ times. Complete this exercise __________ times a day. Internal rotation  Place your left / right hand behind your back, palm-up. Use your other hand to dangle an exercise band, a broomstick, or a similar object over your shoulder. Grasp the band with your left / right hand so you are holding on to both ends. Gently pull up on the band until you feel a stretch in the front of your left / right shoulder. The movement of your arm toward the center of your body is called internal rotation. Avoid shrugging your  shoulder while you raise your arm. Keep your shoulder blade tucked down toward the middle of your back. Hold for __________ seconds. Release the stretch by letting go of the band and lowering your hands. Repeat __________ times. Complete this exercise __________ times a day. Strengthening exercises External rotation  Sit in a stable chair without armrests. Secure an exercise band to a stable object at elbow height on your left / right side. Place a soft object, such as a folded towel or a small pillow, between your left / right upper arm and your body to move your elbow about 4 inches (10 cm) away from your side. Hold the end of the exercise band so it is tight and there is no slack. Keeping your elbow pressed against the soft object, slowly move your forearm out, away from your abdomen (external rotation). Keep your body steady so only your forearm moves. Hold for  __________ seconds. Slowly return to the starting position. Repeat __________ times. Complete this exercise __________ times a day. Shoulder abduction  Sit in a stable chair without armrests, or stand up. Hold a __________ lb / kg weight in your left / right hand, or hold an exercise band with both hands. Start with your arms straight down and your left / right palm facing in, toward your body. Slowly lift your left / right hand out to your side (abduction). Do not lift your hand above shoulder height unless your health care provider tells you that this is safe. Keep your arms straight. Avoid shrugging your shoulder while you do this movement. Keep your shoulder blade tucked down toward the middle of your back. Hold for __________ seconds. Slowly lower your arm, and return to the starting position. Repeat __________ times. Complete this exercise __________ times a day. Shoulder extension  Sit in a stable chair without armrests, or stand up. Secure an exercise band to a stable object in front of you so it is at shoulder height. Hold one end of the exercise band in each hand. Straighten your elbows and lift your hands up to shoulder height. Squeeze your shoulder blades together as you pull your hands down to the sides of your thighs (extension). Stop when your hands are straight down by your sides. Do not let your hands go behind your body. Hold for __________ seconds. Slowly return to the starting position. Repeat __________ times. Complete this exercise __________ times a day. Shoulder row  Sit in a stable chair without armrests, or stand up. Secure an exercise band to a stable object in front of you so it is at chest height. Hold one end of the exercise band in each hand. Position your palms so that your thumbs are facing the ceiling (neutral position). Bend each of your elbows to a 90-degree angle (right angle) and keep your upper arms at your sides. Step back or move the chair back  until the band is tight and there is no slack. Slowly pull your elbows back behind you. Hold for __________ seconds. Slowly return to the starting position. Repeat __________ times. Complete this exercise __________ times a day. Shoulder press-ups  Sit in a stable chair that has armrests. Sit upright, with your feet flat on the floor. Put your hands on the armrests so your elbows are bent and your fingers are pointing forward. Your hands should be about even with the sides of your body. Push down on the armrests and use your arms to lift yourself off the chair. Straighten your elbows  and lift yourself up as much as you comfortably can. Move your shoulder blades down, and avoid letting your shoulders move up toward your ears. Keep your feet on the ground. As you get stronger, your feet should support less of your body weight as you lift yourself up. Hold for __________ seconds. Slowly lower yourself back into the chair. Repeat __________ times. Complete this exercise __________ times a day. Wall push-ups  Stand so you are facing a stable wall. Your feet should be about one arm-length away from the wall. Lean forward and place your palms on the wall at shoulder height. Keep your feet flat on the floor as you bend your elbows and lean forward toward the wall. Hold for __________ seconds. Straighten your elbows to push yourself back to the starting position. Repeat __________ times. Complete this exercise __________ times a day. This information is not intended to replace advice given to you by your health care provider. Make sure you discuss any questions you have with your health care provider. Document Revised: 06/27/2021 Document Reviewed: 06/27/2021 Elsevier Patient Education  2024 [employer].

## 2023-07-03 NOTE — Telephone Encounter (Signed)
Patient Skyrizi SQ PAP renewal completed today by patient. Prescriber form signed by Sherron Ales, PA-C.  Submitted a Prior Authorization request to [facility] for SKYRIZI SQ via CoverMyMeds. Will update once we receive a response.  Key: [id number]  Chesley Mires, PharmD, MPH, BCPS, CPP Clinical Pharmacist (Rheumatology and Pulmonology)

## 2023-07-06 LAB — QUANTIFERON-TB GOLD PLUS
Mitogen-NIL: >10 [IU]/mL
NIL: 0.06 [IU]/mL
QuantiFERON-TB Gold Plus: NEGATIVE
TB1-NIL: 0 [IU]/mL
TB2-NIL: 0 [IU]/mL

## 2023-07-07 NOTE — Progress Notes (Signed)
 TB gold negative

## 2023-07-11 NOTE — Telephone Encounter (Signed)
Called [employer] for update on Skyrizi SQ pt assistance application  Phone: [phone]  Per rep they have received application and does not appear that any information is incomplete or missing. However application is still being reviewed so determination has not been met. Will need to provide Skyrizi SQ sample to pt  Chesley Mires, PharmD, MPH, BCPS, CPP Clinical Pharmacist (Rheumatology and Pulmonology)

## 2023-07-12 DIAGNOSIS — L2089 Other atopic dermatitis: Secondary | ICD-10-CM | POA: Diagnosis not present

## 2023-07-12 DIAGNOSIS — L308 Other specified dermatitis: Secondary | ICD-10-CM | POA: Diagnosis not present

## 2023-07-12 NOTE — Telephone Encounter (Signed)
Medication Samples have been provided to the patient.  Drug name: Skyrizi       Strength: 150 mg        Qty: 1  LOT: [id number]  Exp.Date: 08/2024  Dosing instructions: Inject one pen into skin every 12 weeks.

## 2023-07-18 DIAGNOSIS — Z91014 Allergy to mammalian meats: Secondary | ICD-10-CM | POA: Diagnosis not present

## 2023-07-18 DIAGNOSIS — Z6833 Body mass index (BMI) 33.0-33.9, adult: Secondary | ICD-10-CM | POA: Diagnosis not present

## 2023-07-18 DIAGNOSIS — L4050 Arthropathic psoriasis, unspecified: Secondary | ICD-10-CM | POA: Diagnosis not present

## 2023-07-18 NOTE — Telephone Encounter (Signed)
 Received a fax from  [employer] regarding an approval for SKYRIZI SQ patient assistance from 07/18/2023 to 05/20/2024. Approval letter sent to scan center.  Phone: [phone] Fax: [phone]  Patient notified via MYChart.  Chesley Mires, PharmD, MPH, BCPS, CPP Clinical Pharmacist (Rheumatology and Pulmonology)

## 2023-07-31 ENCOUNTER — Encounter (INDEPENDENT_AMBULATORY_CARE_PROVIDER_SITE_OTHER): Payer: Self-pay

## 2023-07-31 ENCOUNTER — Other Ambulatory Visit (INDEPENDENT_AMBULATORY_CARE_PROVIDER_SITE_OTHER): Payer: Self-pay

## 2023-07-31 DIAGNOSIS — R748 Abnormal levels of other serum enzymes: Secondary | ICD-10-CM

## 2023-07-31 DIAGNOSIS — I10 Essential (primary) hypertension: Secondary | ICD-10-CM

## 2023-07-31 DIAGNOSIS — Z148 Genetic carrier of other disease: Secondary | ICD-10-CM

## 2023-08-09 DIAGNOSIS — R748 Abnormal levels of other serum enzymes: Secondary | ICD-10-CM | POA: Diagnosis not present

## 2023-08-09 DIAGNOSIS — I10 Essential (primary) hypertension: Secondary | ICD-10-CM | POA: Diagnosis not present

## 2023-08-09 DIAGNOSIS — Z148 Genetic carrier of other disease: Secondary | ICD-10-CM | POA: Diagnosis not present

## 2023-08-10 LAB — COMPLETE METABOLIC PANEL WITH GFR
AG Ratio: 1.4 (calc) (ref 1.0–2.5)
ALT: 25 U/L (ref 6–29)
AST: 20 U/L (ref 10–35)
Albumin: 4.3 g/dL (ref 3.6–5.1)
Alkaline phosphatase (APISO): 63 U/L (ref 37–153)
BUN: 11 mg/dL (ref 7–25)
CO2: 33 mmol/L — ABNORMAL HIGH (ref 20–32)
Calcium: 9.9 mg/dL (ref 8.6–10.4)
Chloride: 96 mmol/L — ABNORMAL LOW (ref 98–110)
Creat: 0.84 mg/dL (ref 0.60–1.00)
Globulin: 3.1 g/dL (ref 1.9–3.7)
Glucose, Bld: 101 mg/dL — ABNORMAL HIGH (ref 65–99)
Potassium: 3.6 mmol/L (ref 3.5–5.3)
Sodium: 138 mmol/L (ref 135–146)
Total Bilirubin: 0.4 mg/dL (ref 0.2–1.2)
Total Protein: 7.4 g/dL (ref 6.1–8.1)

## 2023-08-12 ENCOUNTER — Encounter (INDEPENDENT_AMBULATORY_CARE_PROVIDER_SITE_OTHER): Payer: Self-pay

## 2023-08-14 DIAGNOSIS — K080 Exfoliation of teeth due to systemic causes: Secondary | ICD-10-CM | POA: Diagnosis not present

## 2023-09-27 NOTE — Progress Notes (Signed)
 Office Visit Note  Patient: Dorothy Robbins             Date of Birth: 13-Sep-1950           MRN: [id number]             PCP: Fredick Jarred, PA-C Referring: Fredick Jarred, PA-C Visit Date: 10/09/2023 Occupation: @GUAROCC @  Subjective:  Medication monitoring  History of Present Illness: Dorothy Robbins is a 73 y.o. female with history of psoriatic arthritis.  Patient remains on skyrizi  150 mg sq injections every 12 weeks.  She is tolerating Skyrizi  without any side effects and has not had any recent gaps in therapy.  Patient reports that she is due for her next dose of Skyrizi  on Saturday.  Patient states she has had some increased discomfort in her right shoulder which she attributes to being more active with yard work.  She is also had some increased soreness in her lower back.  She is been taking Tylenol  as needed for pain relief.  She denies any joint swelling at this time.  She denies any Achilles tendinitis or plantar fasciitis.  She denies any recent or recurrent infections. Patient reports that she recently underwent acupuncture to help alleviate her symptoms from alpha gal.  Patient states that her skin reaction has improved and she is now doing maintenance acupuncture once a week    Activities of Daily Living:  Patient reports morning stiffness for 15-20 minutes.   Patient Reports nocturnal pain.  Difficulty dressing/grooming: Denies Difficulty climbing stairs: Denies Difficulty getting out of chair: Denies Difficulty using hands for taps, buttons, cutlery, and/or writing: Denies  Review of Systems  Constitutional:  Positive for fatigue.  HENT:  Positive for mouth dryness. Negative for mouth sores.   Eyes:  Positive for dryness.  Respiratory:  Negative for shortness of breath.   Cardiovascular:  Negative for chest pain and palpitations.  Gastrointestinal:  Positive for constipation. Negative for blood in stool and diarrhea.  Endocrine: Negative for increased urination.   Genitourinary:  Negative for involuntary urination.  Musculoskeletal:  Positive for joint pain, joint pain, myalgias, morning stiffness and myalgias. Negative for gait problem, joint swelling, muscle weakness and muscle tenderness.  Skin:  Positive for hair loss and sensitivity to sunlight. Negative for color change and rash.  Allergic/Immunologic: Negative for susceptible to infections.  Neurological:  Negative for dizziness and headaches.  Hematological:  Negative for swollen glands.  Psychiatric/Behavioral:  Positive for sleep disturbance. Negative for depressed mood. The patient is not nervous/anxious.     PMFS History:  Patient Active Problem List   Diagnosis Date Noted   Elevated ferritin 12/06/2022   Hemochromatosis carrier 12/06/2022   Psoriatic arthritis (HCC) 06/28/2022   Psoriasis 06/28/2022   Elevated LFTs 06/28/2022   Primary osteoarthritis of both knees 06/28/2022   Primary osteoarthritis of both feet 06/28/2022   Lumbar stenosis with neurogenic claudication 11/07/2018   Elevated liver enzymes 11/06/2016   Essential hypertension 05/08/2016   Diabetes (HCC) 05/08/2016    Past Medical History:  Diagnosis Date   Allergy  to alpha-gal    Diabetes (HCC) 05/08/2016   Elevated liver enzymes 11/06/2016   Essential hypertension 05/08/2016   Squamous cell carcinoma, leg     Family History  Problem Relation Age of Onset   Diabetes Mother    Heart disease Mother    Thyroid  disease Mother    Aneurysm Father    Hypertension Father    COPD Sister    Lung cancer  Sister    Pancreatic cancer Sister    Past Surgical History:  Procedure Laterality Date   BACK SURGERY     SKIN CANCER EXCISION  06/07/2022   leg       ,            6/24- left forearm   SURGERY OF LIP     Social History   Social History Narrative   Not on file    There is no immunization history on file for this patient.   Objective: Vital Signs: BP 138/86 (BP Location: Left Arm, Patient Position:  Sitting, Cuff Size: Normal)   Pulse 87   Resp 15   Ht 5' 3.5" (1.613 m)   Wt 179 lb 12.8 oz (81.6 kg)   BMI 31.35 kg/m    Physical Exam Vitals and nursing note reviewed.  Constitutional:      Appearance: She is well-developed.  HENT:     Head: Normocephalic and atraumatic.  Eyes:     Conjunctiva/sclera: Conjunctivae normal.  Cardiovascular:     Rate and Rhythm: Normal rate and regular rhythm.     Heart sounds: Normal heart sounds.  Pulmonary:     Effort: Pulmonary effort is normal.     Breath sounds: Normal breath sounds.  Abdominal:     General: Bowel sounds are normal.     Palpations: Abdomen is soft.  Musculoskeletal:     Cervical back: Normal range of motion.  Lymphadenopathy:     Cervical: No cervical adenopathy.  Skin:    General: Skin is warm and dry.     Capillary Refill: Capillary refill takes less than 2 seconds.  Neurological:     Mental Status: She is alert and oriented to person, place, and time.  Psychiatric:        Behavior: Behavior normal.      Musculoskeletal Exam: C-spine, thoracic spine, lumbar spine good range of motion.  Some discomfort with range of motion of the right shoulder.  Left shoulder has full range of motion.  Elbow joints, wrist joints, MCPs, PIPs, DIPs have good range of motion with no synovitis.  Complete fist formation bilaterally.  PIP and DIP thickening consistent with osteoarthritis of both hands.  Hip joints have good range of motion with no groin pain.  Knee joints have good range of motion with no warmth or effusion.  Ankle joints have good range of motion with no tenderness or joint swelling.  No evidence of Achilles tendinitis or plantar fasciitis.  Bunion formation over bilateral first MTP joints noted.  PIP and DIP thickening consistent with osteoarthritis of both feet.   CDAI Exam: CDAI Score: -- Patient Global: --; Provider Global: -- Swollen: --; Tender: -- Joint Exam 10/09/2023   No joint exam has been documented for  this visit   There is currently no information documented on the homunculus. Go to the Rheumatology activity and complete the homunculus joint exam.  Investigation: No additional findings.  Imaging: No results found.  Recent Labs: Lab Results  Component Value Date   WBC 6.6 06/05/2023   HGB 15.5 (H) 06/05/2023   PLT 294 06/05/2023   NA 138 08/09/2023   K 3.6 08/09/2023   CL 96 (L) 08/09/2023   CO2 33 (H) 08/09/2023   GLUCOSE 101 (H) 08/09/2023   BUN 11 08/09/2023   CREATININE 0.84 08/09/2023   BILITOT 0.4 08/09/2023   ALKPHOS 78 02/13/2023   AST 20 08/09/2023   ALT 25 08/09/2023   PROT 7.4 08/09/2023  ALBUMIN  4.2 02/13/2023   CALCIUM 9.9 08/09/2023   GFRAA >60 11/07/2018   QFTBGOLDPLUS NEGATIVE 07/03/2023    Speciality Comments: No specialty comments available.  Procedures:  No procedures performed Allergies: Alpha-gal and Sulfa antibiotics   Assessment / Plan:     Visit Diagnoses: Psoriatic arthritis (HCC) - History of recurrent swelling, sacroiliitis, trochanteric bursitis: She has no synovitis or dactylitis on examination today.  No evidence of Achilles tendinitis or plantar fasciitis.  No active psoriasis at this time.  Overall her symptoms have been well-controlled on Skyrizi  150 mg subcu injections every 12 weeks.  She is due for her next dose of Skyrizi  on Saturday.  Plan to update CBC and CMP today to monitor for drug toxicity.  She is not having to take Tylenol  more frequently recently due to having some increased discomfort in her right shoulder and lower back which she attributes to performing yard work. She will remain on Skyrizi  as prescribed.  She was advised to notify us  if she develops any new or worsening symptoms.  She will follow-up in the office in 5 months or sooner if needed.  Psoriasis - She is followed by Dr. Joanne Muckle.  Inadequate response to topical agents.  Patient was started on Skyrizi  on 09/06/2022.  Her psoriasis is currently well-controlled on  the current treatment regimen.  No medication changes will be made at this time.  High risk medication use -Skyrizi  150 mg sq injections every 12 weeks. Initiated Skyrizi  on 09/06/2022.  CBC updated on 06/05/23.  CMP updated on 08/09/23.  Orders for CBC and CMP released today.  Her next lab work will be due at the end of August and every 3 months to monitor for toxicity. TB gold negative on 07/03/23 No recent or recurrent infections. Discussed the importance of holding skyrizi  if she develops signs or symptoms of an infection and to resume once the infection has completely cleared.  - Plan: CBC with Differential/Platelet, Comprehensive metabolic panel with GFR  Acute pain of right shoulder: Patient continues to have intermittent discomfort in the right shoulder.  She has been performing more yard work than usual which has caused some increased discomfort at night.  She takes Tylenol  as needed for symptomatic relief.  She was given a handout of shoulder exercises to perform.  She will notify us  if her symptoms persist or worsen.  Elevated LFTs - Abdominal ultrasound consistent with fatty liver. LFTs were elevated prior to initiating Skyrizi . AST and ALT WNL on 08/09/23.  CMP updated today.  - Plan: Comprehensive metabolic panel with GFR  Fatty liver - Evaluated by GI-11/06/2022: Alpha 1 antitrypsin WNL, ANA-, anti-smooth muscle ab-, ceruloplasmin WNL, mitochondrial ab 29.7.dx w/ heterozygous hemochromatosis. AST and ALT WNL on 08/09/23.   Hereditary hemochromatosis (HCC) - Heterozygous hemochromatosis for H63D:  History of ulcer of lower extremity - Healed. No recurrence.  Primary osteoarthritis of both hands: She has PIP and DIP thickening consistent with osteoarthritis of both hands.  Complete fist formation bilaterally.  No synovitis or dactylitis noted.  Chronic pain of both hips: She has good range of motion of both hip joints with no groin pain.  Primary osteoarthritis of both knees: She has  good range of motion of both knee joints on examination today.  No warmth or effusion noted.  Primary osteoarthritis of both feet: Ankle joints have good range of motion with no tenderness or joint swelling.  No evidence of Achilles tendinitis or plantar fasciitis.  Bunion formation over bilateral first MTP joints  noted.  PIP and DIP thickening consistent with osteoarthritis of both feet.  Discussed the importance of wearing proper fitting shoes.  Chronic SI joint pain: She experiences intermittent soreness in her lower back.  Her discomfort has been exacerbated by performing yard work.  She takes Tylenol  as needed for symptomatic relief.  Lumbar stenosis with neurogenic claudication: She has noted some increased discomfort in her lower back which she attributes to being more active performing yard work lately.  She has not had any symptoms of radiculopathy.  She is been taking Tylenol  as needed for symptomatic relief.  Other medical conditions are listed as follows:   Essential hypertension: BP 138/86 today in the office.   History of diabetes mellitus  Cancer, skin, squamous cell-right lower extremity  Family history of systemic lupus erythematosus-paternal aunt and grand niece  Allergy  to alpha-gal - Recurrent flares-many dietary triggers.  Recently tried acupuncture which improved her rash.  Orders: Orders Placed This Encounter  Procedures   CBC with Differential/Platelet   Comprehensive metabolic panel with GFR   No orders of the defined types were placed in this encounter.    Follow-Up Instructions: Return in about 5 months (around 03/10/2024) for Psoriatic arthritis.   Romayne Clubs, PA-C  Note - This record has been created using Dragon software.  Chart creation errors have been sought, but may not always  have been located. Such creation errors do not reflect on  the standard of medical care.

## 2023-10-09 ENCOUNTER — Ambulatory Visit: Payer: Medicare Other | Attending: Physician Assistant | Admitting: Physician Assistant

## 2023-10-09 ENCOUNTER — Encounter: Payer: Self-pay | Admitting: Physician Assistant

## 2023-10-09 VITALS — BP 138/86 | HR 87 | Resp 15 | Ht 63.5 in | Wt 179.8 lb

## 2023-10-09 DIAGNOSIS — C4492 Squamous cell carcinoma of skin, unspecified: Secondary | ICD-10-CM

## 2023-10-09 DIAGNOSIS — M25551 Pain in right hip: Secondary | ICD-10-CM

## 2023-10-09 DIAGNOSIS — M170 Bilateral primary osteoarthritis of knee: Secondary | ICD-10-CM

## 2023-10-09 DIAGNOSIS — M533 Sacrococcygeal disorders, not elsewhere classified: Secondary | ICD-10-CM

## 2023-10-09 DIAGNOSIS — L409 Psoriasis, unspecified: Secondary | ICD-10-CM

## 2023-10-09 DIAGNOSIS — K760 Fatty (change of) liver, not elsewhere classified: Secondary | ICD-10-CM

## 2023-10-09 DIAGNOSIS — M19071 Primary osteoarthritis, right ankle and foot: Secondary | ICD-10-CM

## 2023-10-09 DIAGNOSIS — Z79899 Other long term (current) drug therapy: Secondary | ICD-10-CM

## 2023-10-09 DIAGNOSIS — Z872 Personal history of diseases of the skin and subcutaneous tissue: Secondary | ICD-10-CM

## 2023-10-09 DIAGNOSIS — Z91018 Allergy to other foods: Secondary | ICD-10-CM

## 2023-10-09 DIAGNOSIS — L4050 Arthropathic psoriasis, unspecified: Secondary | ICD-10-CM | POA: Diagnosis not present

## 2023-10-09 DIAGNOSIS — Z8639 Personal history of other endocrine, nutritional and metabolic disease: Secondary | ICD-10-CM

## 2023-10-09 DIAGNOSIS — Z8269 Family history of other diseases of the musculoskeletal system and connective tissue: Secondary | ICD-10-CM

## 2023-10-09 DIAGNOSIS — M25511 Pain in right shoulder: Secondary | ICD-10-CM | POA: Diagnosis not present

## 2023-10-09 DIAGNOSIS — R7989 Other specified abnormal findings of blood chemistry: Secondary | ICD-10-CM | POA: Diagnosis not present

## 2023-10-09 DIAGNOSIS — M25552 Pain in left hip: Secondary | ICD-10-CM

## 2023-10-09 DIAGNOSIS — M48062 Spinal stenosis, lumbar region with neurogenic claudication: Secondary | ICD-10-CM

## 2023-10-09 DIAGNOSIS — M19042 Primary osteoarthritis, left hand: Secondary | ICD-10-CM

## 2023-10-09 DIAGNOSIS — M19072 Primary osteoarthritis, left ankle and foot: Secondary | ICD-10-CM

## 2023-10-09 DIAGNOSIS — M19041 Primary osteoarthritis, right hand: Secondary | ICD-10-CM

## 2023-10-09 DIAGNOSIS — G8929 Other chronic pain: Secondary | ICD-10-CM

## 2023-10-09 DIAGNOSIS — I10 Essential (primary) hypertension: Secondary | ICD-10-CM

## 2023-10-09 NOTE — Patient Instructions (Addendum)
 Standing Labs We placed an order today for your standing lab work.   Please have your standing labs drawn at the end of August and every 3 months   Please have your labs drawn 2 weeks prior to your appointment so that the provider can discuss your lab results at your appointment, if possible.  Please note that you may see your imaging and lab results in MyChart before we have reviewed them. We will contact you once all results are reviewed. Please allow our office up to 72 hours to thoroughly review all of the results before contacting the office for clarification of your results.  WALK-IN LAB HOURS  Monday through Thursday from 8:00 am -12:30 pm and 1:00 pm-4:00 pm and Friday from 8:00 am-12:00 pm.  Patients with office visits requiring labs will be seen before walk-in labs.  You may encounter longer than normal wait times. Please allow additional time. Wait times may be shorter on  Monday and Thursday afternoons.  We do not book appointments for walk-in labs. We appreciate your patience and understanding with our staff.   Labs are drawn by [facility]. Please bring your co-pay at the time of your lab draw.  You may receive a bill from [facility] for your lab work.  Please note if you are on Hydroxychloroquine and and an order has been placed for a Hydroxychloroquine level,  you will need to have it drawn 4 hours or more after your last dose.  If you wish to have your labs drawn at another location, please call the office 24 hours in advance so we can fax the orders.  The office is located at [address], [place], [place] [address]   If you have any questions regarding directions or hours of operation,  please call [phone].   As a reminder, please drink plenty of water prior to coming for your lab work. Thanks!   Shoulder Exercises Ask your health care provider which exercises are safe for you. Do exercises exactly as told by your health care provider and adjust them as  directed. It is normal to feel mild stretching, pulling, tightness, or discomfort as you do these exercises. Stop right away if you feel sudden pain or your pain gets worse. Do not begin these exercises until told by your health care provider. Stretching exercises External rotation and abduction This exercise is sometimes called corner stretch. The exercise rotates your arm outward (external rotation) and moves your arm out from your body (abduction). Stand in a doorway with one of your feet slightly in front of the other. This is called a staggered stance. If you cannot reach your forearms to the door frame, stand facing a corner of a room. Choose one of the following positions as told by your health care provider: Place your hands and forearms on the door frame above your head. Place your hands and forearms on the door frame at the height of your head. Place your hands on the door frame at the height of your elbows. Slowly move your weight onto your front foot until you feel a stretch across your chest and in the front of your shoulders. Keep your head and chest upright and keep your abdominal muscles tight. Hold for __________ seconds. To release the stretch, shift your weight to your back foot. Repeat __________ times. Complete this exercise __________ times a day. Extension, standing  Stand and hold a broomstick, a cane, or a similar object behind your back. Your hands should be  a little wider than shoulder-width apart. Your palms should face away from your back. Keeping your elbows straight and your shoulder muscles relaxed, move the stick away from your body until you feel a stretch in your shoulders (extension). Avoid shrugging your shoulders while you move the stick. Keep your shoulder blades tucked down toward the middle of your back. Hold for __________ seconds. Slowly return to the starting position. Repeat __________ times. Complete this exercise __________ times a  day. Range-of-motion exercises Pendulum  Stand near a wall or a surface that you can hold onto for balance. Bend at the waist and let your left / right arm hang straight down. Use your other arm to support you. Keep your back straight and do not lock your knees. Relax your left / right arm and shoulder muscles, and move your hips and your trunk so your left / right arm swings freely. Your arm should swing because of the motion of your body, not because you are using your arm or shoulder muscles. Keep moving your hips and trunk so your arm swings in the following directions, as told by your health care provider: Side to side. Forward and backward. In clockwise and counterclockwise circles. Continue each motion for __________ seconds, or for as long as told by your health care provider. Slowly return to the starting position. Repeat __________ times. Complete this exercise __________ times a day. Shoulder flexion, standing  Stand and hold a broomstick, a cane, or a similar object. Place your hands a little more than shoulder-width apart on the object. Your left / right hand should be palm-up, and your other hand should be palm-down. Keep your elbow straight and your shoulder muscles relaxed. Push the stick up with your healthy arm to raise your left / right arm in front of your body, and then over your head until you feel a stretch in your shoulder (flexion). Avoid shrugging your shoulder while you raise your arm. Keep your shoulder blade tucked down toward the middle of your back. Hold for __________ seconds. Slowly return to the starting position. Repeat __________ times. Complete this exercise __________ times a day. Shoulder abduction, standing  Stand and hold a broomstick, a cane, or a similar object. Place your hands a little more than shoulder-width apart on the object. Your left / right hand should be palm-up, and your other hand should be palm-down. Keep your elbow straight and your  shoulder muscles relaxed. Push the object across your body toward your left / right side. Raise your left / right arm to the side of your body (abduction) until you feel a stretch in your shoulder. Do not raise your arm above shoulder height unless your health care provider tells you to do that. If directed, raise your arm over your head. Avoid shrugging your shoulder while you raise your arm. Keep your shoulder blade tucked down toward the middle of your back. Hold for __________ seconds. Slowly return to the starting position. Repeat __________ times. Complete this exercise __________ times a day. Internal rotation  Place your left / right hand behind your back, palm-up. Use your other hand to dangle an exercise band, a broomstick, or a similar object over your shoulder. Grasp the band with your left / right hand so you are holding on to both ends. Gently pull up on the band until you feel a stretch in the front of your left / right shoulder. The movement of your arm toward the center of your body is called internal  rotation. Avoid shrugging your shoulder while you raise your arm. Keep your shoulder blade tucked down toward the middle of your back. Hold for __________ seconds. Release the stretch by letting go of the band and lowering your hands. Repeat __________ times. Complete this exercise __________ times a day. Strengthening exercises External rotation  Sit in a stable chair without armrests. Secure an exercise band to a stable object at elbow height on your left / right side. Place a soft object, such as a folded towel or a small pillow, between your left / right upper arm and your body to move your elbow about 4 inches (10 cm) away from your side. Hold the end of the exercise band so it is tight and there is no slack. Keeping your elbow pressed against the soft object, slowly move your forearm out, away from your abdomen (external rotation). Keep your body steady so only your forearm  moves. Hold for __________ seconds. Slowly return to the starting position. Repeat __________ times. Complete this exercise __________ times a day. Shoulder abduction  Sit in a stable chair without armrests, or stand up. Hold a __________ lb / kg weight in your left / right hand, or hold an exercise band with both hands. Start with your arms straight down and your left / right palm facing in, toward your body. Slowly lift your left / right hand out to your side (abduction). Do not lift your hand above shoulder height unless your health care provider tells you that this is safe. Keep your arms straight. Avoid shrugging your shoulder while you do this movement. Keep your shoulder blade tucked down toward the middle of your back. Hold for __________ seconds. Slowly lower your arm, and return to the starting position. Repeat __________ times. Complete this exercise __________ times a day. Shoulder extension  Sit in a stable chair without armrests, or stand up. Secure an exercise band to a stable object in front of you so it is at shoulder height. Hold one end of the exercise band in each hand. Straighten your elbows and lift your hands up to shoulder height. Squeeze your shoulder blades together as you pull your hands down to the sides of your thighs (extension). Stop when your hands are straight down by your sides. Do not let your hands go behind your body. Hold for __________ seconds. Slowly return to the starting position. Repeat __________ times. Complete this exercise __________ times a day. Shoulder row  Sit in a stable chair without armrests, or stand up. Secure an exercise band to a stable object in front of you so it is at chest height. Hold one end of the exercise band in each hand. Position your palms so that your thumbs are facing the ceiling (neutral position). Bend each of your elbows to a 90-degree angle (right angle) and keep your upper arms at your sides. Step back or move  the chair back until the band is tight and there is no slack. Slowly pull your elbows back behind you. Hold for __________ seconds. Slowly return to the starting position. Repeat __________ times. Complete this exercise __________ times a day. Shoulder press-ups  Sit in a stable chair that has armrests. Sit upright, with your feet flat on the floor. Put your hands on the armrests so your elbows are bent and your fingers are pointing forward. Your hands should be about even with the sides of your body. Push down on the armrests and use your arms to lift yourself off the  chair. Straighten your elbows and lift yourself up as much as you comfortably can. Move your shoulder blades down, and avoid letting your shoulders move up toward your ears. Keep your feet on the ground. As you get stronger, your feet should support less of your body weight as you lift yourself up. Hold for __________ seconds. Slowly lower yourself back into the chair. Repeat __________ times. Complete this exercise __________ times a day. Wall push-ups  Stand so you are facing a stable wall. Your feet should be about one arm-length away from the wall. Lean forward and place your palms on the wall at shoulder height. Keep your feet flat on the floor as you bend your elbows and lean forward toward the wall. Hold for __________ seconds. Straighten your elbows to push yourself back to the starting position. Repeat __________ times. Complete this exercise __________ times a day. This information is not intended to replace advice given to you by your health care provider. Make sure you discuss any questions you have with your health care provider. Document Revised: 06/27/2021 Document Reviewed: 06/27/2021 Elsevier Patient Education  2024 [employer].

## 2023-10-10 ENCOUNTER — Ambulatory Visit: Payer: Self-pay | Admitting: Physician Assistant

## 2023-10-10 LAB — COMPREHENSIVE METABOLIC PANEL WITH GFR
AG Ratio: 1.7 (calc) (ref 1.0–2.5)
ALT: 30 U/L — ABNORMAL HIGH (ref 6–29)
AST: 27 U/L (ref 10–35)
Albumin: 4.5 g/dL (ref 3.6–5.1)
Alkaline phosphatase (APISO): 59 U/L (ref 37–153)
BUN: 10 mg/dL (ref 7–25)
CO2: 32 mmol/L (ref 20–32)
Calcium: 9.9 mg/dL (ref 8.6–10.4)
Chloride: 97 mmol/L — ABNORMAL LOW (ref 98–110)
Creat: 0.79 mg/dL (ref 0.60–1.00)
Globulin: 2.7 g/dL (ref 1.9–3.7)
Glucose, Bld: 187 mg/dL — ABNORMAL HIGH (ref 65–99)
Potassium: 4 mmol/L (ref 3.5–5.3)
Sodium: 137 mmol/L (ref 135–146)
Total Bilirubin: 0.5 mg/dL (ref 0.2–1.2)
Total Protein: 7.2 g/dL (ref 6.1–8.1)
eGFR: 79 mL/min/{1.73_m2} (ref >=60)

## 2023-10-10 LAB — CBC WITH DIFFERENTIAL/PLATELET
Absolute Lymphocytes: 1540 {cells}/uL (ref 850–3900)
Absolute Monocytes: 531 {cells}/uL (ref 200–950)
Basophils Absolute: 62 {cells}/uL (ref 0–200)
Basophils Relative: 0.8 %
Eosinophils Absolute: 231 {cells}/uL (ref 15–500)
Eosinophils Relative: 3 %
HCT: 44 % (ref 35.0–45.0)
Hemoglobin: 14.9 g/dL (ref 11.7–15.5)
MCH: 31.2 pg (ref 27.0–33.0)
MCHC: 33.9 g/dL (ref 32.0–36.0)
MCV: 92.2 fL (ref 80.0–100.0)
MPV: 11.4 fL (ref 7.5–12.5)
Monocytes Relative: 6.9 %
Neutro Abs: 5336 {cells}/uL (ref 1500–7800)
Neutrophils Relative %: 69.3 %
Platelets: 324 10*3/uL (ref 140–400)
RBC: 4.77 10*6/uL (ref 3.80–5.10)
RDW: 13.6 % (ref 11.0–15.0)
Total Lymphocyte: 20 %
WBC: 7.7 10*3/uL (ref 3.8–10.8)

## 2023-10-10 NOTE — Progress Notes (Signed)
 CBC WNL Glucose is 187.  AST WNL. ALT is borderline elevated-30--patient has been taking tylenol  as needed.  Try to reduce tylenol  in intake.  Rest of CMP WNL.

## 2023-10-23 DIAGNOSIS — Z1329 Encounter for screening for other suspected endocrine disorder: Secondary | ICD-10-CM | POA: Diagnosis not present

## 2023-10-23 DIAGNOSIS — E7849 Other hyperlipidemia: Secondary | ICD-10-CM | POA: Diagnosis not present

## 2023-10-23 DIAGNOSIS — K760 Fatty (change of) liver, not elsewhere classified: Secondary | ICD-10-CM | POA: Diagnosis not present

## 2023-10-23 DIAGNOSIS — R945 Abnormal results of liver function studies: Secondary | ICD-10-CM | POA: Diagnosis not present

## 2023-10-23 DIAGNOSIS — Z131 Encounter for screening for diabetes mellitus: Secondary | ICD-10-CM | POA: Diagnosis not present

## 2023-10-23 DIAGNOSIS — I10 Essential (primary) hypertension: Secondary | ICD-10-CM | POA: Diagnosis not present

## 2023-10-30 DIAGNOSIS — K760 Fatty (change of) liver, not elsewhere classified: Secondary | ICD-10-CM | POA: Diagnosis not present

## 2023-10-30 DIAGNOSIS — Z6833 Body mass index (BMI) 33.0-33.9, adult: Secondary | ICD-10-CM | POA: Diagnosis not present

## 2023-10-30 DIAGNOSIS — L4050 Arthropathic psoriasis, unspecified: Secondary | ICD-10-CM | POA: Diagnosis not present

## 2023-10-30 DIAGNOSIS — E7849 Other hyperlipidemia: Secondary | ICD-10-CM | POA: Diagnosis not present

## 2023-10-30 DIAGNOSIS — Z91014 Allergy to mammalian meats: Secondary | ICD-10-CM | POA: Diagnosis not present

## 2023-10-30 DIAGNOSIS — E782 Mixed hyperlipidemia: Secondary | ICD-10-CM | POA: Diagnosis not present

## 2023-11-09 ENCOUNTER — Other Ambulatory Visit: Payer: Self-pay | Admitting: Internal Medicine

## 2023-11-11 ENCOUNTER — Ambulatory Visit: Admitting: Allergy

## 2023-11-11 ENCOUNTER — Ambulatory Visit: Payer: Medicare Other | Admitting: Internal Medicine

## 2023-11-13 DIAGNOSIS — Z1231 Encounter for screening mammogram for malignant neoplasm of breast: Secondary | ICD-10-CM | POA: Diagnosis not present

## 2023-12-02 ENCOUNTER — Ambulatory Visit: Admitting: Allergy

## 2023-12-03 ENCOUNTER — Other Ambulatory Visit: Payer: Self-pay

## 2023-12-03 DIAGNOSIS — R7989 Other specified abnormal findings of blood chemistry: Secondary | ICD-10-CM

## 2023-12-03 DIAGNOSIS — Z148 Genetic carrier of other disease: Secondary | ICD-10-CM

## 2023-12-04 ENCOUNTER — Inpatient Hospital Stay: Payer: Medicare Other | Attending: Hematology

## 2023-12-04 DIAGNOSIS — Z79899 Other long term (current) drug therapy: Secondary | ICD-10-CM | POA: Diagnosis not present

## 2023-12-04 DIAGNOSIS — Z148 Genetic carrier of other disease: Secondary | ICD-10-CM

## 2023-12-04 DIAGNOSIS — R7989 Other specified abnormal findings of blood chemistry: Secondary | ICD-10-CM

## 2023-12-04 LAB — CBC WITH DIFFERENTIAL/PLATELET
Abs Immature Granulocytes: 0.02 K/uL (ref 0.00–0.07)
Basophils Absolute: 0 K/uL (ref 0.0–0.1)
Basophils Relative: 0 %
Eosinophils Absolute: 0 K/uL (ref 0.0–0.5)
Eosinophils Relative: 0 %
HCT: 43.6 % (ref 36.0–46.0)
Hemoglobin: 14.9 g/dL (ref 12.0–15.0)
Immature Granulocytes: 0 %
Lymphocytes Relative: 26 %
Lymphs Abs: 1.8 K/uL (ref 0.7–4.0)
MCH: 31.5 pg (ref 26.0–34.0)
MCHC: 34.2 g/dL (ref 30.0–36.0)
MCV: 92.2 fL (ref 80.0–100.0)
Monocytes Absolute: 0.7 K/uL (ref 0.1–1.0)
Monocytes Relative: 10 %
Neutro Abs: 4.4 K/uL (ref 1.7–7.7)
Neutrophils Relative %: 64 %
Platelets: 302 K/uL (ref 150–400)
RBC: 4.73 MIL/uL (ref 3.87–5.11)
RDW: 12.8 % (ref 11.5–15.5)
WBC: 6.9 K/uL (ref 4.0–10.5)
nRBC: 0 % (ref 0.0–0.2)

## 2023-12-04 LAB — IRON AND TIBC
Iron: 98 ug/dL (ref 28–170)
Saturation Ratios: 29 % (ref 10.4–31.8)
TIBC: 334 ug/dL (ref 250–450)
UIBC: 236 ug/dL

## 2023-12-04 LAB — FERRITIN: Ferritin: 84 ng/mL (ref 11–307)

## 2023-12-04 NOTE — Progress Notes (Unsigned)
 [facility] [address] [place] [address]   Clinic Day:  12/06/2022  Referring physician: Dow Longs, PA-C  Patient Care Team: Dow Longs, PA-C as PCP - General (Family Medicine)  I connected with Dorothy Robbins on 12/05/23 at  9:30 AM EDT by telephone visit and verified that I am speaking with the correct person using two identifiers.   I discussed the limitations, risks, security and privacy concerns of performing an evaluation and management service by telemedicine and the availability of in-person appointments. I also discussed with the patient that there may be a patient responsible charge related to this service. The patient expressed understanding and agreed to proceed.   Other persons participating in the visit and their role in the encounter: NP, Patient    Patient's location: Home  Provider's location: Clinic   ASSESSMENT & PLAN:   Assessment:  1.  Heterozygous hemochromatosis for H63D: - Patient was seen by GI service for elevated LFTs.  Dorothy Robbins had elevated LFTs since 2017. - Labs on 11/06/2022: Ferritin 390, percent saturation 37. - Patient started on Skyrizi  for psoriatic arthritis, received 2 shots on 08/27/2022 on 09/28/2022. - Ultrasound abdomen (09/05/2022): Fatty liver - Hemochromatosis testing on 11/15/2022: Heterozygosity for H63D mutation. - 11/06/2016: Ferritin 136  2.  Social/family history: - Lives at home with Dorothy Robbins husband.  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  Non-smoker.  No chemical exposure. - Mother had fatty liver leading to nonalcoholic cirrhosis.  Sister had pancreatic cancer.  No family history of hemochromatosis.  Plan:  1.  Heterozygous hemochromatosis for H63D: - Previously noted to have an elevated ferritin level at 390.  -Repeat lab work from 12/04/2023 showed further decline of Dorothy Robbins ferritin from 123-84. - Dr. Rogers had discussed that iron overload is extremely rare in people who are  heterozygous for H63D mutation.  Ferritin levels are normally moderately increased and heterozygous individuals, above normal but usually below 1000 ng/mL.  He discussed that if iron levels continue to be elevated he would recommend an MRI of Dorothy Robbins liver although Dorothy Robbins does not need this at this time. -Given improvement of Dorothy Robbins ferritin, we can see Dorothy Robbins back in 1 year with lab work and if all is stable, Dorothy Robbins will be discharged to Dorothy Robbins PCP. -Otherwise lab work is essentially unremarkable.  Hemoglobin 14.9 (14.9), normal MCV and platelet count.  Differential is unremarkable.  White blood cell count 6.9.  Iron panel reveals iron sats of 29% and normal TIBC.  Ferritin is 84. -Continue to avoid iron rich foods.  2.  Psoriatic arthritis/osteoarthritis: -Dorothy Robbins is followed by dermatology. -Dorothy Robbins receives every 2-month Skyrizi  injections.  Last injection was in November. -Continue follow-up with dermatology.  3.  Alpha gal: -This is a new diagnosis over the past few months. -Dorothy Robbins is unable to eat meats along with chocolate. -Dorothy Robbins questions whether alpha gal has caused worsening of Dorothy Robbins psoriasis and skin issues. -Dorothy Robbins did have acupuncture back in November and has recently started to see a chiropractor Dr. Waddell which also appears to be helping some of Dorothy Robbins symptoms.  PLAN SUMMARY: >> Recommend follow-up in 12 months with labs a few days before and office visit.     Orders Placed This Encounter  Procedures   CBC with Differential    Standing Status:   Future    Expected Date:   12/04/2024    Expiration Date:   12/04/2024   Ferritin    Standing Status:  Future    Expected Date:   12/04/2024    Expiration Date:   12/04/2024   Iron and TIBC ([facility] ONLY)    Standing Status:   Future    Expected Date:   12/04/2024    Expiration Date:   12/04/2024   I provided 10 minutes of non face-to-face telephone visit time during this encounter, and > 50% was spent counseling as documented under my  assessment & plan.   Delon FORBES Hope, NP   7/17/20259:17 AM  CHIEF COMPLAINT/PURPOSE OF CONSULT:   Diagnosis: elevated ferritin  Current Therapy: Observation  HISTORY OF PRESENT ILLNESS:   Dorothy Robbins is a 73 y.o. Robbins presenting to clinic today for follow-up.  Dorothy Robbins was last seen in clinic on 06/06/23.   In the interim, Dorothy Robbins denies any hospitalizations, surgeries or changes to Dorothy Robbins baseline health.  Dorothy Robbins continues to follow with rheumatology for psoriatic arthritis and osteoarthritis.  Dorothy Robbins is currently still on Skyrizi  injections given every 3 months.  Dorothy Robbins was diagnosed with alpha gal and is unable to tolerate meats and chocolate.  Dorothy Robbins has seen both acupuncture and chiropractor which appear to be helping some with Dorothy Robbins symptoms.  Dorothy Robbins was referred to allergy [facility] and met with Dr. Tobie on 05/06/2023.  Reports they did testing for over 64 different allergies and Dorothy Robbins was negative for all of them.  Reports they have also been looking into redeye to see if this is contributing to Dorothy Robbins outbreaks.  Dorothy Robbins is currently not taking any supplements due to this and will likely have some adjustments made to Dorothy Robbins medications as they also contain red dye.  Reports appetite of 100% energy levels of 50%.  Denies any pain.  Has occasional constipation. Has numbness and tingling in feet and hands intermittently.  Has trouble sleeping.  All this is chronic and stable.   PAST MEDICAL HISTORY:   Past Medical History: Past Medical History:  Diagnosis Date   Allergy  to alpha-gal    Diabetes (HCC) 05/08/2016   Elevated liver enzymes 11/06/2016   Essential hypertension 05/08/2016   Squamous cell carcinoma, leg     Surgical History: Past Surgical History:  Procedure Laterality Date   BACK SURGERY     SKIN CANCER EXCISION  06/07/2022   leg       ,            6/24- left forearm   SURGERY OF LIP      Social History: Social History   Socioeconomic History   Marital status: Married    Spouse name: Not  on file   Number of children: Not on file   Years of education: Not on file   Highest education level: Not on file  Occupational History   Not on file  Tobacco Use   Smoking status: Never    Passive exposure: Past   Smokeless tobacco: Never  Vaping Use   Vaping status: Never Used  Substance and Sexual Activity   Alcohol use: No   Drug use: No   Sexual activity: Not on file  Other Topics Concern   Not on file  Social History Narrative   Not on file   Social Drivers of Health   Financial Resource Strain: Not on file  Food Insecurity: No Food Insecurity (12/06/2022)   Hunger Vital Sign    Worried About Running Out of Food in the Last Year: Never true    Ran Out of Food in the Last Year: Never true  Transportation Needs: No  Transportation Needs (12/06/2022)   PRAPARE - Administrator, Civil Service (Medical): No    Lack of Transportation (Non-Medical): No  Physical Activity: Not on file  Stress: Not on file  Social Connections: Not on file  Intimate Partner Violence: Not At Risk (12/06/2022)   Humiliation, Afraid, Rape, and Kick questionnaire    Fear of Current or Ex-Partner: No    Emotionally Abused: No    Physically Abused: No    Sexually Abused: No    Family History: Family History  Problem Relation Age of Onset   Diabetes Mother    Heart disease Mother    Thyroid  disease Mother    Aneurysm Father    Hypertension Father    COPD Sister    Lung cancer Sister    Pancreatic cancer Sister     Current Medications:  Current Outpatient Medications:    calcipotriene (DOVONOX) 0.005 % ointment, Apply topically as needed., Disp: , Rfl:    Calcium Carb-Cholecalciferol  (CALCIUM 500+D3 PO), Take by mouth., Disp: , Rfl:    diphenhydrAMINE (BENADRYL) 50 MG tablet, Take 50 mg by mouth daily., Disp: , Rfl:    EPINEPHrine  (EPIPEN  2-PAK) 0.3 mg/0.3 mL IJ SOAJ injection, Inject 0.3 mg into the muscle as needed for anaphylaxis., Disp: 2 each, Rfl: 1   ezetimibe (ZETIA)  10 MG tablet, Take 10 mg by mouth 2 (two) times a week., Disp: , Rfl:    famotidine (PEPCID) 20 MG tablet, Take 20 mg by mouth daily., Disp: , Rfl:    fexofenadine  (ALLEGRA  ALLERGY ) 180 MG tablet, Take 1 tablet (180 mg total) by mouth daily., Disp: 30 tablet, Rfl: 5   fluticasone  (FLONASE ) 50 MCG/ACT nasal spray, Place 2 sprays into both nostrils daily., Disp: 16 g, Rfl: 5   gabapentin  (NEURONTIN ) 600 MG tablet, Take 600 mg by mouth. One at night, Disp: , Rfl:    glipiZIDE (GLUCOTROL XL) 10 MG 24 hr tablet, Take 10 mg by mouth daily with breakfast., Disp: , Rfl:    hydrocortisone 2.5 % cream, Apply topically as needed., Disp: , Rfl:    lisinopril -hydrochlorothiazide  (PRINZIDE ,ZESTORETIC ) 20-25 MG tablet, Take 1 tablet by mouth at bedtime. , Disp: , Rfl:    OZEMPIC, 0.25 OR 0.5 MG/DOSE, 2 MG/3ML SOPN, Inject 0.5 mg into the skin once a week., Disp: , Rfl:    risankizumab -rzaa (SKYRIZI  PEN) 150 MG/ML pen, Inject 150mg  into the skin every 12 weeks., Disp: 1 mL, Rfl: 0   Semaglutide (OZEMPIC, 1 MG/DOSE, [place]), Inject into the skin once a week., Disp: , Rfl:    Allergies: Allergies  Allergen Reactions   Alpha-Gal    Sulfa Antibiotics Rash    REVIEW OF SYSTEMS:   Review of Systems  Constitutional:  Negative for fatigue and fever.  Respiratory:  Positive for cough.   Gastrointestinal:  Positive for nausea. Negative for constipation, diarrhea and vomiting.  Musculoskeletal:  Positive for arthralgias.  Skin:  Positive for itching and rash.  Neurological:  Positive for numbness.  Psychiatric/Behavioral:  Positive for sleep disturbance.      VITALS:   There were no vitals taken for this visit.  Wt Readings from Last 3 Encounters:  10/09/23 179 lb 12.8 oz (81.6 kg)  07/03/23 181 lb (82.1 kg)  06/13/23 179 lb 12.8 oz (81.6 kg)    There is no height or weight on file to calculate BMI.   PHYSICAL EXAM:   Physical Exam Neurological:     Mental Status: Dorothy Robbins is alert and oriented  to person,  place, and time.     LABS:      Latest Ref Rng & Units 12/04/2023    7:51 AM 10/09/2023    9:53 AM 06/05/2023    8:30 AM  CBC  WBC 4.0 - 10.5 K/uL 6.9  7.7  6.6   Hemoglobin 12.0 - 15.0 g/dL 85.0  85.0  84.4   Hematocrit 36.0 - 46.0 % 43.6  44.0  45.6   Platelets 150 - 400 K/uL 302  324  294       Latest Ref Rng & Units 10/09/2023    9:53 AM 08/09/2023   10:49 AM 06/13/2023   10:00 AM  CMP  Glucose 65 - 99 mg/dL 812  898  794   BUN 7 - 25 mg/dL 10  11  10    Creatinine 0.60 - 1.00 mg/dL 9.20  9.15  9.14   Sodium 135 - 146 mmol/L 137  138  137   Potassium 3.5 - 5.3 mmol/L 4.0  3.6  3.9   Chloride 98 - 110 mmol/L 97  96  95   CO2 20 - 32 mmol/L 32  33  30   Calcium 8.6 - 10.4 mg/dL 9.9  9.9  89.9   Total Protein 6.1 - 8.1 g/dL 7.2  7.4  7.5   Total Bilirubin 0.2 - 1.2 mg/dL 0.5  0.4  0.5   AST 10 - 35 U/L 27  20  40   ALT 6 - 29 U/L 30  25  44      No results found for: CEA1, CEA / No results found for: CEA1, CEA No results found for: PSA1 No results found for: CAN199 No results found for: RJW874  Lab Results  Component Value Date   ALBUMINELP 4.7 06/06/2022   A1GS 0.3 06/06/2022   A2GS 0.9 06/06/2022   BETS 0.5 06/06/2022   BETA2SER 0.5 06/06/2022   GAMS 1.1 06/06/2022   SPEI  06/06/2022     Comment:     Normal Serum Protein Electrophoresis Pattern. No abnormal protein bands (M-protein) detected.    Lab Results  Component Value Date   TIBC 334 12/04/2023   TIBC 343 06/05/2023   TIBC 281 11/06/2022   FERRITIN 84 12/04/2023   FERRITIN 123 06/05/2023   FERRITIN 390 (H) 11/06/2022   IRONPCTSAT 29 12/04/2023   IRONPCTSAT 33 (H) 06/05/2023   IRONPCTSAT 37 11/06/2022   No results found for: LDH   STUDIES:   No results found.

## 2023-12-05 ENCOUNTER — Inpatient Hospital Stay: Payer: Medicare Other | Admitting: Oncology

## 2023-12-05 DIAGNOSIS — Z91014 Allergy to mammalian meats: Secondary | ICD-10-CM

## 2023-12-05 DIAGNOSIS — R7989 Other specified abnormal findings of blood chemistry: Secondary | ICD-10-CM

## 2023-12-05 DIAGNOSIS — L409 Psoriasis, unspecified: Secondary | ICD-10-CM

## 2023-12-05 DIAGNOSIS — Z148 Genetic carrier of other disease: Secondary | ICD-10-CM

## 2023-12-05 DIAGNOSIS — M1990 Unspecified osteoarthritis, unspecified site: Secondary | ICD-10-CM | POA: Diagnosis not present

## 2023-12-09 ENCOUNTER — Encounter: Payer: Self-pay | Admitting: Allergy

## 2023-12-09 ENCOUNTER — Other Ambulatory Visit: Payer: Self-pay

## 2023-12-09 ENCOUNTER — Ambulatory Visit: Admitting: Allergy

## 2023-12-09 VITALS — BP 134/84 | HR 88 | Temp 97.3°F | Resp 18 | Ht 61.81 in | Wt 178.8 lb

## 2023-12-09 DIAGNOSIS — R21 Rash and other nonspecific skin eruption: Secondary | ICD-10-CM | POA: Diagnosis not present

## 2023-12-09 DIAGNOSIS — J3089 Other allergic rhinitis: Secondary | ICD-10-CM | POA: Diagnosis not present

## 2023-12-09 DIAGNOSIS — Z91018 Allergy to other foods: Secondary | ICD-10-CM | POA: Diagnosis not present

## 2023-12-09 NOTE — Patient Instructions (Addendum)
 Alpha Gal Syndrome:  - recommend to avoid all mammalian meat.  - for SKIN only reaction, okay to take Benadryl 25mg  tablet every 6 hours as needed - for SKIN + ANY additional symptoms, OR IF concern for LIFE THREATENING reaction = Epipen  Autoinjector EpiPen  0.3 mg. - If using Epinephrine  autoinjector, call 911 or go to the ER.    Other Allergic Rhinitis: - SPT 04/2023: positive to none - Use Allegra  180mg  daily as needed for runny nose, sneezing, itchy watery eyes .   - Use nasal saline rinses before nose sprays such as with Neilmed Sinus Rinse.  Use distilled water.   - If symptoms worsen, use Flonase  2 sprays each nostril daily.  Aim upward and outward.    Rashes: - On Skyrizi   - Continue follow-up care with your dermatology and their recommendations  - Do a daily soaking tub bath in warm water for 10-15 minutes.  - Use a gentle, unscented cleanser at the end of the bath (such as Dove unscented bar or baby wash, or Aveeno sensitive body wash or vanicream). Then rinse, pat half-way dry, and apply a gentle, unscented moisturizer cream or ointment (Vanicream)  all over while still damp. Dry skin makes the itching  worse. The skin should be moisturized with a gentle, unscented moisturizer at least twice daily.  - Use only unscented liquid laundry detergent.  Follow-up in 6-12 months or sooner if needed

## 2023-12-09 NOTE — Progress Notes (Addendum)
 Follow-up Note  RE: Dorothy Robbins MRN: [id number] DOB: [birth year] Date of Office Visit: 12/09/2023   History of present illness: Dorothy Robbins is a 73 y.o. female presenting today for follow-up of alpha gal allergy , allergic rhinitis and rash.  She was last seen in the office on 05/13/2023 by Dr. Tobie. Discussed the use of AI scribe software for clinical note transcription with the patient, who gave verbal consent to proceed.  She has a known alpha-gal allergy  and has been avoiding red meat somewhat. Despite undergoing acupuncture treatment in November of last year in [place], [place] , which involved seven needles in one ear, she continued to experience mild reactions when consuming red meat. Currently, she is seeing a chiropractor locally who performs a different type of acupuncture involving two needles in each ear for thirty minutes, followed by the placement of metal beads.  She then eats about a fingernail sized amounts of bacon daily.  She states with her next visit that she was going to see if she could increase the portion size.  She is hopeful for improvement with this treatment.  However she is noticing some rash on her arms now.  She has an appointment next Friday to continue her treatment regimen with the chiropractor. She carries an up-to-date EpiPen  at all times and has ordered extras for an upcoming cruise. She is concerned about cross-contamination and is interested in checking her alpha-gal levels at this time.  She has a history of a widespread rash that persisted for almost two years, affecting her from head to toe, which was intensely pruritic. This led to consultations with a dermatologist, rheumatologist, and gastroenterologist, eventually resulting in a diagnosis of psoriatic arthritis and treatment with Skyrizi . Her granddaughter, who also has alpha-gal, suggested testing for the allergy , which returned positive results.  And this positive testing with alpha gal is what  led her to see us , the allergist.  She continues to experience some residual rash and scarring, which she manages with a prescription ointment starting with 'D' (per her medication list dovonex) and hydrocortisone as needed. She also uses Allegra  and Flonase  for environmental allergies, with Flonase  used daily or as needed.  Review of systems: 10pt ROS negative unless noted above in HPI   Past medical/social/surgical/family history have been reviewed and are unchanged unless specifically indicated below.  No changes  Medication List: Current Outpatient Medications  Medication Sig Dispense Refill   calcipotriene (DOVONOX) 0.005 % ointment Apply topically as needed.     Calcium Carb-Cholecalciferol  (CALCIUM 500+D3 PO) Take by mouth.     diphenhydrAMINE (BENADRYL) 50 MG tablet Take 50 mg by mouth daily.     EPINEPHrine  (EPIPEN  2-PAK) 0.3 mg/0.3 mL IJ SOAJ injection Inject 0.3 mg into the muscle as needed for anaphylaxis. 2 each 1   ezetimibe (ZETIA) 10 MG tablet Take 10 mg by mouth 2 (two) times a week.     famotidine (PEPCID) 20 MG tablet Take 20 mg by mouth daily.     fexofenadine  (ALLEGRA  ALLERGY ) 180 MG tablet Take 1 tablet (180 mg total) by mouth daily. 30 tablet 5   fluticasone  (FLONASE ) 50 MCG/ACT nasal spray Place 2 sprays into both nostrils daily. 16 g 5   gabapentin  (NEURONTIN ) 600 MG tablet Take 600 mg by mouth. One at night     glipiZIDE (GLUCOTROL XL) 10 MG 24 hr tablet Take 10 mg by mouth daily with breakfast.     hydrocortisone 2.5 % cream Apply topically as needed.  lisinopril -hydrochlorothiazide  (PRINZIDE ,ZESTORETIC ) 20-25 MG tablet Take 1 tablet by mouth at bedtime.      OZEMPIC, 0.25 OR 0.5 MG/DOSE, 2 MG/3ML SOPN Inject 0.5 mg into the skin once a week.     risankizumab -rzaa (SKYRIZI  PEN) 150 MG/ML pen Inject 150mg  into the skin every 12 weeks. 1 mL 0   Semaglutide (OZEMPIC, 1 MG/DOSE, [place]) Inject into the skin once a week.     No current facility-administered  medications for this visit.     Known medication allergies: Allergies  Allergen Reactions   Alpha-Gal    Sulfa Antibiotics Rash     Physical examination: Blood pressure 134/84, pulse 88, temperature (!) 97.3 F (36.3 C), temperature source Temporal, resp. rate 18, height 5' 1.81 (1.57 m), weight 178 lb 12.8 oz (81.1 kg), SpO2 96%.  General: Alert, interactive, in no acute distress. HEENT: PERRLA, TMs pearly gray, turbinates non-edematous without discharge, post-pharynx non erythematous. Neck: Supple without lymphadenopathy. Lungs: Clear to auscultation without wheezing, rhonchi or rales. {no increased work of breathing. CV: Normal S1, S2 without murmurs. Abdomen: Nondistended, nontender. Skin: Scattered erythematous urticarial type lesions primarily located left upper arm , nonvesicular. Extremities:  No clubbing, cyanosis or edema. Neuro:   Grossly intact.  Diagnostics/Labs: None today  Assessment and plan: Alpha Gal Syndrome:  - recommend to avoid all mammalian meat.  Advised today acupuncture will cure alpha gal allergy .  She would be very cautious with consuming red meat products as this could lead to symptoms or anaphylaxis - for SKIN only reaction, okay to take Benadryl 25mg  tablet every 6 hours as needed - for SKIN + ANY additional symptoms, OR IF concern for LIFE THREATENING reaction = Epipen  Autoinjector EpiPen  0.3 mg. - If using Epinephrine  autoinjector, call 911 or go to the ER.    Other Allergic Rhinitis: - SPT 04/2023: positive to none - Use Allegra  180mg  daily as needed for runny nose, sneezing, itchy watery eyes .   - Use nasal saline rinses before nose sprays such as with Neilmed Sinus Rinse.  Use distilled water.   - If symptoms worsen, use Flonase  2 sprays each nostril daily.  Aim upward and outward.    Rashes: - On Skyrizi   - Continue follow-up care with your dermatology and their recommendations  - Do a daily soaking tub bath in warm water for 10-15  minutes.  - Use a gentle, unscented cleanser at the end of the bath (such as Dove unscented bar or baby wash, or Aveeno sensitive body wash or vanicream). Then rinse, pat half-way dry, and apply a gentle, unscented moisturizer cream or ointment (Vanicream)  all over while still damp. Dry skin makes the itching  worse. The skin should be moisturized with a gentle, unscented moisturizer at least twice daily.  - Use only unscented liquid laundry detergent.  Follow-up in 6-12 months or sooner if needed  I appreciate the opportunity to take part in Oaklee's care. Please do not hesitate to contact me with questions.  Sincerely,   Danita Brain, MD Allergy /Immunology [facility]

## 2023-12-11 ENCOUNTER — Ambulatory Visit: Payer: Self-pay | Admitting: Allergy

## 2023-12-11 LAB — ALPHA-GAL PANEL
Allergen Lamb IgE: 0.1 kU/L — AB
Beef IgE: 0.19 kU/L — AB
IgE (Immunoglobulin E), Serum: 54 [IU]/mL (ref 6–495)
O215-IgE Alpha-Gal: 1.16 kU/L — AB
Pork IgE: <0.1 kU/L

## 2024-01-15 DIAGNOSIS — D045 Carcinoma in situ of skin of trunk: Secondary | ICD-10-CM | POA: Diagnosis not present

## 2024-01-15 DIAGNOSIS — L308 Other specified dermatitis: Secondary | ICD-10-CM | POA: Diagnosis not present

## 2024-01-15 DIAGNOSIS — C44622 Squamous cell carcinoma of skin of right upper limb, including shoulder: Secondary | ICD-10-CM | POA: Diagnosis not present

## 2024-01-15 DIAGNOSIS — L570 Actinic keratosis: Secondary | ICD-10-CM | POA: Diagnosis not present

## 2024-01-15 DIAGNOSIS — D485 Neoplasm of uncertain behavior of skin: Secondary | ICD-10-CM | POA: Diagnosis not present

## 2024-02-12 ENCOUNTER — Ambulatory Visit: Admitting: Dermatology

## 2024-02-19 DIAGNOSIS — K080 Exfoliation of teeth due to systemic causes: Secondary | ICD-10-CM | POA: Diagnosis not present

## 2024-02-26 NOTE — Progress Notes (Signed)
 Office Visit Note  Patient: Dorothy Robbins             Date of Birth: Oct 04, 1950           MRN: [id number]             PCP: Dow Longs, PA-C Referring: Dow Longs, PA-C Visit Date: 03/11/2024 Occupation: Data Unavailable  Subjective:  Medication monitoring   History of Present Illness: Dorothy Robbins is a 73 y.o. female with history of psoriatic arthritis.  Patient remains on skyrizi  150 mg sq injections every 12 weeks.  She is tolerating Skyrizi  without any side effects or injection site reactions.  She is due for her next dose of Skyrizi  on 04/13/2024.  She requested a refill of Skyrizi  to be sent to the pharmacy today.  She continues to find Skyrizi  to be effective at managing her symptoms.  Her psoriasis has been well-controlled.  She has noticed ongoing discomfort in the right [facility] joint as well as increased pain in both feet especially at night.  She denies any joint swelling.  She denies any SI joint pain.  She denies any Achilles tendinitis or plantar fasciitis. Patient states that she was diagnosed with gout by her PCP.  Patient states that she has not had a uric acid level checked but has had recurrent flares involving the left great toe.  Patient states that she has had 2 flares in the past month.  She does not eat any shellfish and avoids red meat especially since she has an alpha gal allergy .  She is not taking a urate lowering medication at this time.  She has been prescribed colchicine which she takes as needed at the onset of a flare.  Patient states that she has a family history of gout.  Activities of Daily Living:  Patient reports morning stiffness for 20 minutes   Patient Reports nocturnal pain.  Difficulty dressing/grooming: Denies Difficulty climbing stairs: Denies Difficulty getting out of chair: Denies Difficulty using hands for taps, buttons, cutlery, and/or writing: Reports  Review of Systems  Constitutional:  Positive for fatigue.  HENT:  Negative for  mouth sores, mouth dryness and nose dryness.   Eyes:  Negative for pain, visual disturbance and dryness.  Respiratory:  Negative for cough, hemoptysis, shortness of breath and difficulty breathing.   Cardiovascular:  Negative for chest pain, palpitations, hypertension and swelling in legs/feet.  Gastrointestinal:  Positive for diarrhea. Negative for blood in stool and constipation.  Endocrine: Negative for increased urination.  Genitourinary:  Negative for painful urination.  Musculoskeletal:  Positive for joint pain, joint pain, joint swelling and morning stiffness. Negative for myalgias, muscle weakness, muscle tenderness and myalgias.  Skin:  Negative for color change, pallor, rash, hair loss, nodules/bumps, skin tightness, ulcers and sensitivity to sunlight.  Allergic/Immunologic: Negative for susceptible to infections.  Neurological:  Negative for dizziness, numbness, headaches and weakness.  Hematological:  Negative for swollen glands.  Psychiatric/Behavioral:  Negative for depressed mood and sleep disturbance. The patient is not nervous/anxious.     PMFS History:  Patient Active Problem List   Diagnosis Date Noted   Elevated ferritin 12/06/2022   Hemochromatosis carrier 12/06/2022   Psoriatic arthritis (HCC) 06/28/2022   Psoriasis 06/28/2022   Elevated LFTs 06/28/2022   Primary osteoarthritis of both knees 06/28/2022   Primary osteoarthritis of both feet 06/28/2022   Lumbar stenosis with neurogenic claudication 11/07/2018   Elevated liver enzymes 11/06/2016   Essential hypertension 05/08/2016   Diabetes (HCC) 05/08/2016  Past Medical History:  Diagnosis Date   Allergy  to alpha-gal    Diabetes (HCC) 05/08/2016   Elevated liver enzymes 11/06/2016   Essential hypertension 05/08/2016   Squamous cell carcinoma, leg     Family History  Problem Relation Age of Onset   Diabetes Mother    Heart disease Mother    Thyroid  disease Mother    Aneurysm Father    Hypertension  Father    COPD Sister    Lung cancer Sister    Pancreatic cancer Sister    Past Surgical History:  Procedure Laterality Date   BACK SURGERY     SKIN CANCER EXCISION  06/07/2022   leg       ,            6/24- left forearm   SURGERY OF LIP     Social History   Tobacco Use   Smoking status: Never    Passive exposure: Past   Smokeless tobacco: Never  Vaping Use   Vaping status: Never Used  Substance Use Topics   Alcohol use: No   Drug use: No   Social History   Social History Narrative   Not on file      There is no immunization history on file for this patient.   Objective: Vital Signs: BP 128/74   Pulse 74   Temp (!) 97.5 F (36.4 C)   Resp 15   Ht 5' 3 (1.6 m)   Wt 182 lb (82.6 kg)   BMI 32.24 kg/m    Physical Exam Vitals and nursing note reviewed.  Constitutional:      Appearance: She is well-developed.  HENT:     Head: Normocephalic and atraumatic.  Eyes:     Conjunctiva/sclera: Conjunctivae normal.  Cardiovascular:     Rate and Rhythm: Normal rate and regular rhythm.     Heart sounds: Normal heart sounds.  Pulmonary:     Effort: Pulmonary effort is normal.     Breath sounds: Normal breath sounds.  Abdominal:     General: Bowel sounds are normal.     Palpations: Abdomen is soft.  Musculoskeletal:     Cervical back: Normal range of motion.  Lymphadenopathy:     Cervical: No cervical adenopathy.  Skin:    General: Skin is warm and dry.     Capillary Refill: Capillary refill takes less than 2 seconds.  Neurological:     Mental Status: She is alert and oriented to person, place, and time.  Psychiatric:        Behavior: Behavior normal.      Musculoskeletal Exam: C-spine, thoracic spine, lumbar spine have good range of motion.   No SI joint tenderness.  Shoulder joints, elbow joints, wrist joints, MCPs, PIPs, DIPs have good range of motion with no synovitis.  Complete fist formation bilaterally.  PIP and DIP thickening consistent with  osteoarthritis of both hands.  CMC joint prominence and thickening bilaterally.  Tenderness of the right CMC joint.  Hip joints have good range of motion with no groin pain.  Knee joints have good range of motion no warmth or effusion.  Ankle joints have good range of motion no tenderness or joint swelling.  No evidence of Achilles tendinitis or plantar fasciitis.  Tenderness of the left first MTP joint with mild postinflammatory skin peeling noted.  Hammertoes noted.  Right first MTP thickening and bunion formation noted.   CDAI Exam: CDAI Score: -- Patient Global: --; Provider Global: -- Swollen: --; Tender: --  Joint Exam 03/11/2024   No joint exam has been documented for this visit   There is currently no information documented on the homunculus. Go to the Rheumatology activity and complete the homunculus joint exam.  Investigation: No additional findings.  Imaging: No results found.  Recent Labs: Lab Results  Component Value Date   WBC 6.9 12/04/2023   HGB 14.9 12/04/2023   PLT 302 12/04/2023   NA 137 10/09/2023   K 4.0 10/09/2023   CL 97 (L) 10/09/2023   CO2 32 10/09/2023   GLUCOSE 187 (H) 10/09/2023   BUN 10 10/09/2023   CREATININE 0.79 10/09/2023   BILITOT 0.5 10/09/2023   ALKPHOS 78 02/13/2023   AST 27 10/09/2023   ALT 30 (H) 10/09/2023   PROT 7.2 10/09/2023   ALBUMIN  4.2 02/13/2023   CALCIUM 9.9 10/09/2023   GFRAA >60 11/07/2018   QFTBGOLDPLUS NEGATIVE 07/03/2023    Speciality Comments: No specialty comments available.  Procedures:  No procedures performed Allergies: Alpha-gal and Sulfa antibiotics    Assessment / Plan:     Visit Diagnoses: Psoriatic arthritis (HCC) - History of recurrent swelling, sacroiliitis, trochanteric bursitis: She has no synovitis or dactylitis on examination on examination today.  She has no SI joint tenderness upon palpation.  No evidence of Achilles tendinitis or plantar fasciitis.  No active psoriasis at this time.  She has  clinically been doing well on Skyrizi  150 mg sq injections once every 12 weeks.  She is due for her next dose of Skyrizi  on 04/13/2024.  A refill was sent to the pharmacy today.  She continues to experience discomfort in the right [facility] joint as well as both feet due to underlying osteoarthritis.  No inflammation was noted on exam.  She was advised to notify us  if she develops any new or worsening symptoms. She will follow-up in the office in 5 months or sooner if needed.- Plan: risankizumab -rzaa (SKYRIZI  PEN) 150 MG/ML pen  Psoriasis - Followed by Dr. Robinson.  Inadequate response to topical agents.  Patient was started on Skyrizi  on 09/06/2022.  Currently well-controlled on Skyrizi .  No medication changes will be made at this time.- Plan: risankizumab -rzaa (SKYRIZI  PEN) 150 MG/ML pen  High risk medication use - Skyrizi  150 mg sq injections every 12 weeks.Initiated Skyrizi  on 09/06/2022. CBC and CMP updated on 10/09/23. CBC updated on 12/04/23. Orders for CBC and CMP released today. TB  gold negative on 07/03/23 No recent or recurrent infections.  Discussed the importance of holing skyrizi  if she develops signs or symptoms of an infection and to resume once the infection has completely cleared.   - Plan: risankizumab -rzaa (SKYRIZI  PEN) 150 MG/ML pen, CBC with Differential/Platelet, Comprehensive metabolic panel with GFR  Acute pain of right shoulder: Intermittent discomfort.  Good range of motion on examination today.  Great toe pain, left - She has been diagnosed with gout by her PCP.  According to the patient she has had 2 gout flares in the past month.  She has not had a uric acid level drawn.  She has been prescribed colchicine to take at the onset of the flare which has alleviated her symptoms.  She is not currently taking any urate lowering medications.  Plan to check uric acid level today.  Discussed the option of allopurinol if she continues to have recurrent flares.    Plan: Uric acid  History of  gout -Family history of gout.  Diagnosed by PCP.  No uric acid level has been drawn.  She is  not currently taking a urate lowering medication.  She has had 2 gout flares in the past month involving the left great toe.  She has tenderness and mild postinflammatory skin peeling overlying the left first MTP joint.  No active inflammation noted today.  Plan to check uric acid today.  Plan: Uric acid  Elevated LFTs - Abdominal ultrasound consistent with fatty liver. LFTs were elevated prior to initiating Skyrizi .  Plan to continue to follow lab work closely-CMP updated today.-- Plan: Comprehensive metabolic panel with GFR  Primary osteoarthritis of both hands: PIP and DIP thickening consistent with osteoarthritis of both hands.  No synovitis noted.  CMC joint prominence and thickening bilaterally.  Tenderness of the right CMC joint noted.  Discussed the importance of joint protection and muscle strengthening.  Chronic pain of both hips: No groin pain currently.  Primary osteoarthritis of both knees: Good range of motion of both knee joints with no warmth or effusion.  Primary osteoarthritis of both feet: Ankle joints have good range of motion with no tenderness or synovitis.  No evidence of Achilles tendinitis or plantar fasciitis.  Chronic SI joint pain: No SI joint tenderness upon palpation.  Lumbar stenosis with neurogenic claudication: No symptoms of radiculopathy at this time.  Other medical conditions are listed as follows:   Essential hypertension: Blood pressure is 128/74 today in the office.  History of diabetes mellitus  Cancer, skin, squamous cell-right lower extremity  Family history of systemic lupus erythematosus-paternal aunt and grand niece  Allergy  to alpha-gal  Fatty liver  Hereditary hemochromatosis - Heterozygous hemochromatosis for H63D:  History of ulcer of lower extremity - healed  Orders: Orders Placed This Encounter  Procedures   CBC with Differential/Platelet    Comprehensive metabolic panel with GFR   Uric acid   Meds ordered this encounter  Medications   risankizumab -rzaa (SKYRIZI  PEN) 150 MG/ML pen    Sig: Inject 150mg  into the skin every 12 weeks.    Dispense:  1 mL    Refill:  0    Follow-Up Instructions: Return in about 5 months (around 08/09/2024) for Psoriatic arthritis.   Dorothy CHRISTELLA Craze, PA-C  Note - This record has been created using Dragon software.  Chart creation errors have been sought, but may not always  have been located. Such creation errors do not reflect on  the standard of medical care.

## 2024-03-04 ENCOUNTER — Encounter (INDEPENDENT_AMBULATORY_CARE_PROVIDER_SITE_OTHER): Payer: Self-pay | Admitting: Gastroenterology

## 2024-03-11 ENCOUNTER — Ambulatory Visit: Attending: Physician Assistant | Admitting: Physician Assistant

## 2024-03-11 ENCOUNTER — Telehealth: Payer: Self-pay | Admitting: Pharmacist

## 2024-03-11 ENCOUNTER — Encounter: Payer: Self-pay | Admitting: Physician Assistant

## 2024-03-11 VITALS — BP 128/74 | HR 74 | Temp 97.5°F | Resp 15 | Ht 63.0 in | Wt 182.0 lb

## 2024-03-11 DIAGNOSIS — L409 Psoriasis, unspecified: Secondary | ICD-10-CM | POA: Diagnosis not present

## 2024-03-11 DIAGNOSIS — L4050 Arthropathic psoriasis, unspecified: Secondary | ICD-10-CM | POA: Diagnosis not present

## 2024-03-11 DIAGNOSIS — M19071 Primary osteoarthritis, right ankle and foot: Secondary | ICD-10-CM

## 2024-03-11 DIAGNOSIS — M19072 Primary osteoarthritis, left ankle and foot: Secondary | ICD-10-CM

## 2024-03-11 DIAGNOSIS — M25511 Pain in right shoulder: Secondary | ICD-10-CM | POA: Diagnosis not present

## 2024-03-11 DIAGNOSIS — M79675 Pain in left toe(s): Secondary | ICD-10-CM

## 2024-03-11 DIAGNOSIS — Z79899 Other long term (current) drug therapy: Secondary | ICD-10-CM | POA: Diagnosis not present

## 2024-03-11 DIAGNOSIS — Z8739 Personal history of other diseases of the musculoskeletal system and connective tissue: Secondary | ICD-10-CM

## 2024-03-11 DIAGNOSIS — M25552 Pain in left hip: Secondary | ICD-10-CM

## 2024-03-11 DIAGNOSIS — C4492 Squamous cell carcinoma of skin, unspecified: Secondary | ICD-10-CM

## 2024-03-11 DIAGNOSIS — M170 Bilateral primary osteoarthritis of knee: Secondary | ICD-10-CM

## 2024-03-11 DIAGNOSIS — M533 Sacrococcygeal disorders, not elsewhere classified: Secondary | ICD-10-CM

## 2024-03-11 DIAGNOSIS — M19041 Primary osteoarthritis, right hand: Secondary | ICD-10-CM

## 2024-03-11 DIAGNOSIS — Z8639 Personal history of other endocrine, nutritional and metabolic disease: Secondary | ICD-10-CM

## 2024-03-11 DIAGNOSIS — Z872 Personal history of diseases of the skin and subcutaneous tissue: Secondary | ICD-10-CM

## 2024-03-11 DIAGNOSIS — M19042 Primary osteoarthritis, left hand: Secondary | ICD-10-CM

## 2024-03-11 DIAGNOSIS — I10 Essential (primary) hypertension: Secondary | ICD-10-CM

## 2024-03-11 DIAGNOSIS — M25551 Pain in right hip: Secondary | ICD-10-CM

## 2024-03-11 DIAGNOSIS — Z91018 Allergy to other foods: Secondary | ICD-10-CM

## 2024-03-11 DIAGNOSIS — K760 Fatty (change of) liver, not elsewhere classified: Secondary | ICD-10-CM

## 2024-03-11 DIAGNOSIS — Z8269 Family history of other diseases of the musculoskeletal system and connective tissue: Secondary | ICD-10-CM

## 2024-03-11 DIAGNOSIS — G8929 Other chronic pain: Secondary | ICD-10-CM

## 2024-03-11 DIAGNOSIS — M48062 Spinal stenosis, lumbar region with neurogenic claudication: Secondary | ICD-10-CM

## 2024-03-11 DIAGNOSIS — R7989 Other specified abnormal findings of blood chemistry: Secondary | ICD-10-CM

## 2024-03-11 MED ORDER — SKYRIZI PEN 150 MG/ML ~~LOC~~ SOAJ
SUBCUTANEOUS | 0 refills | Status: AC
Start: 2024-03-11 — End: ?

## 2024-03-11 NOTE — Telephone Encounter (Signed)
 Patient completed her portion of Skyrizi  SQ PAP renewal application at [facility] today. Prescriber form placed in Waddell Craze, PA-C's folder for signature  Submitted a Prior Authorization RENEWAL request to [facility] for SKYRIZI  SQ via CoverMyMeds. Will update once we receive a response.  Key: [id number]   Per automated response: Prior Authorization Not Required  Sherry Pennant, PharmD, MPH, BCPS, CPP Clinical Pharmacist [facility])

## 2024-03-12 ENCOUNTER — Ambulatory Visit: Payer: Self-pay | Admitting: Physician Assistant

## 2024-03-12 ENCOUNTER — Encounter: Payer: Self-pay | Admitting: *Deleted

## 2024-03-12 LAB — URIC ACID: Uric Acid, Serum: 7 mg/dL (ref 2.5–7.0)

## 2024-03-12 LAB — CBC WITH DIFFERENTIAL/PLATELET
Absolute Lymphocytes: 1862 {cells}/uL (ref 850–3900)
Absolute Monocytes: 481 {cells}/uL (ref 200–950)
Basophils Absolute: 41 {cells}/uL (ref 0–200)
Basophils Relative: 0.7 %
Eosinophils Absolute: 0 {cells}/uL — ABNORMAL LOW (ref 15–500)
Eosinophils Relative: 0 %
HCT: 43.9 % (ref 35.0–45.0)
Hemoglobin: 14.9 g/dL (ref 11.7–15.5)
MCH: 31 pg (ref 27.0–33.0)
MCHC: 33.9 g/dL (ref 32.0–36.0)
MCV: 91.3 fL (ref 80.0–100.0)
MPV: 11.5 fL (ref 7.5–12.5)
Monocytes Relative: 8.3 %
Neutro Abs: 3416 {cells}/uL (ref 1500–7800)
Neutrophils Relative %: 58.9 %
Platelets: 350 Thousand/uL (ref 140–400)
RBC: 4.81 Million/uL (ref 3.80–5.10)
RDW: 12.9 % (ref 11.0–15.0)
Total Lymphocyte: 32.1 %
WBC: 5.8 Thousand/uL (ref 3.8–10.8)

## 2024-03-12 LAB — COMPREHENSIVE METABOLIC PANEL WITH GFR
AG Ratio: 1.7 (calc) (ref 1.0–2.5)
ALT: 47 U/L — ABNORMAL HIGH (ref 6–29)
AST: 46 U/L — ABNORMAL HIGH (ref 10–35)
Albumin: 4.7 g/dL (ref 3.6–5.1)
Alkaline phosphatase (APISO): 52 U/L (ref 37–153)
BUN: 8 mg/dL (ref 7–25)
CO2: 32 mmol/L (ref 20–32)
Calcium: 10.6 mg/dL — ABNORMAL HIGH (ref 8.6–10.4)
Chloride: 96 mmol/L — ABNORMAL LOW (ref 98–110)
Creat: 0.78 mg/dL (ref 0.60–1.00)
Globulin: 2.8 g/dL (ref 1.9–3.7)
Glucose, Bld: 76 mg/dL (ref 65–99)
Potassium: 4 mmol/L (ref 3.5–5.3)
Sodium: 138 mmol/L (ref 135–146)
Total Bilirubin: 0.3 mg/dL (ref 0.2–1.2)
Total Protein: 7.5 g/dL (ref 6.1–8.1)
eGFR: 80 mL/min/1.73m2 (ref >=60)

## 2024-03-12 NOTE — Progress Notes (Signed)
 Absolute eosinophils remains low. Rest of CBC WNL.  Calcium is elevated-10.6-please clarify if she has been taking a calcium or vitamin D supplement?  AST and ALT are elevated-any recent tylenol  use? Alcohol use?  Uric acid is 7.0--ideally uric acid should be less than 6-recommend avoiding a purine rich diet.  We can send the handout about dietary recommendations to follow if she would like to review it. If she continues to have gout flares we will need to discuss starting allopurinol

## 2024-03-12 NOTE — Progress Notes (Signed)
 Recommend reducing calicum intake.

## 2024-03-13 NOTE — Telephone Encounter (Signed)
 Submitted Patient Assistance RENEWAL Application to [employer] for SKYRIZI  SQ with patient portion, provider portion, insurance card copy, prior authorization approval, and current medication list. Will update patient when we receive a response.  Phone: [phone] Fax: [phone]

## 2024-03-18 NOTE — Telephone Encounter (Signed)
 Received a fax from [employer] regarding an approval for SKYRIZI  SQ patient assistance from 03/18/2024 to 05/20/2025. Approval letter sent to scan center.  Phone: [phone] Fax: [phone]

## 2024-03-26 DIAGNOSIS — D045 Carcinoma in situ of skin of trunk: Secondary | ICD-10-CM | POA: Diagnosis not present

## 2024-03-26 DIAGNOSIS — C44622 Squamous cell carcinoma of skin of right upper limb, including shoulder: Secondary | ICD-10-CM | POA: Diagnosis not present

## 2024-03-26 DIAGNOSIS — D0461 Carcinoma in situ of skin of right upper limb, including shoulder: Secondary | ICD-10-CM | POA: Diagnosis not present

## 2024-04-13 DIAGNOSIS — L578 Other skin changes due to chronic exposure to nonionizing radiation: Secondary | ICD-10-CM | POA: Diagnosis not present

## 2024-04-13 DIAGNOSIS — D485 Neoplasm of uncertain behavior of skin: Secondary | ICD-10-CM | POA: Diagnosis not present

## 2024-04-13 DIAGNOSIS — L570 Actinic keratosis: Secondary | ICD-10-CM | POA: Diagnosis not present

## 2024-04-13 DIAGNOSIS — L409 Psoriasis, unspecified: Secondary | ICD-10-CM | POA: Diagnosis not present

## 2024-04-13 DIAGNOSIS — Z4802 Encounter for removal of sutures: Secondary | ICD-10-CM | POA: Diagnosis not present

## 2024-04-13 DIAGNOSIS — Z85828 Personal history of other malignant neoplasm of skin: Secondary | ICD-10-CM | POA: Diagnosis not present

## 2024-04-13 DIAGNOSIS — D225 Melanocytic nevi of trunk: Secondary | ICD-10-CM | POA: Diagnosis not present

## 2024-04-27 DIAGNOSIS — Z1329 Encounter for screening for other suspected endocrine disorder: Secondary | ICD-10-CM | POA: Diagnosis not present

## 2024-04-27 DIAGNOSIS — E7849 Other hyperlipidemia: Secondary | ICD-10-CM | POA: Diagnosis not present

## 2024-04-27 DIAGNOSIS — K760 Fatty (change of) liver, not elsewhere classified: Secondary | ICD-10-CM | POA: Diagnosis not present

## 2024-05-04 DIAGNOSIS — I10 Essential (primary) hypertension: Secondary | ICD-10-CM | POA: Diagnosis not present

## 2024-05-04 DIAGNOSIS — Z1389 Encounter for screening for other disorder: Secondary | ICD-10-CM | POA: Diagnosis not present

## 2024-05-04 DIAGNOSIS — Z91014 Allergy to mammalian meats: Secondary | ICD-10-CM | POA: Diagnosis not present

## 2024-05-04 DIAGNOSIS — Z1331 Encounter for screening for depression: Secondary | ICD-10-CM | POA: Diagnosis not present

## 2024-05-04 DIAGNOSIS — Z6834 Body mass index (BMI) 34.0-34.9, adult: Secondary | ICD-10-CM | POA: Diagnosis not present

## 2024-05-04 DIAGNOSIS — Z0001 Encounter for general adult medical examination with abnormal findings: Secondary | ICD-10-CM | POA: Diagnosis not present

## 2024-05-04 DIAGNOSIS — L4050 Arthropathic psoriasis, unspecified: Secondary | ICD-10-CM | POA: Diagnosis not present

## 2024-05-04 DIAGNOSIS — K760 Fatty (change of) liver, not elsewhere classified: Secondary | ICD-10-CM | POA: Diagnosis not present

## 2024-06-08 ENCOUNTER — Ambulatory Visit: Admitting: Internal Medicine

## 2024-06-08 ENCOUNTER — Encounter: Payer: Self-pay | Admitting: Internal Medicine

## 2024-06-08 ENCOUNTER — Other Ambulatory Visit: Payer: Self-pay

## 2024-06-08 VITALS — BP 102/80 | HR 98 | Temp 97.7°F | Resp 18 | Ht 62.48 in | Wt 186.2 lb

## 2024-06-08 DIAGNOSIS — J310 Chronic rhinitis: Secondary | ICD-10-CM | POA: Diagnosis not present

## 2024-06-08 DIAGNOSIS — Z91018 Allergy to other foods: Secondary | ICD-10-CM | POA: Diagnosis not present

## 2024-06-08 MED ORDER — EPINEPHRINE 0.3 MG/0.3ML IJ SOAJ: 0.3000 mg | INTRAMUSCULAR | 1 refills | Status: AC | PRN

## 2024-06-08 MED ORDER — FEXOFENADINE HCL 180 MG PO TABS: 180.0000 mg | ORAL_TABLET | Freq: Every day | ORAL | 1 refills | Status: AC | PRN

## 2024-06-08 MED ORDER — FLUTICASONE PROPIONATE 50 MCG/ACT NA SUSP: 2.0000 | Freq: Every day | NASAL | 5 refills | Status: AC

## 2024-06-08 NOTE — Progress Notes (Signed)
" ° °  FOLLOW UP Date of Service/Encounter:  06/08/24   Subjective:  Dorothy Robbins (DOB: 09-16-1950) is a 74 y.o. female who [facility] for follow up for alpha gal and chronic rhinitis.   History obtained from: chart review and patient. Last seen on 12/09/2023 with Dr Jeneal where they discussed avoidance of all mammalian meat.  Also on Flonase /Allegra  for rhinitis.  Avoids mammalian meat but did eat a little bacon once, no issues.  Also going to chiropractor to help lower her alpha gal level.  Has not had any reactions, no use of Epipen . Notes some congestion and drainage with the colder weather and use of wood stove in the house. Using Flonase /Allegra  PRN, not regularly.   Past Medical History: Past Medical History:  Diagnosis Date   Allergy  to alpha-gal    Diabetes (HCC) 05/08/2016   Elevated liver enzymes 11/06/2016   Essential hypertension 05/08/2016   Squamous cell carcinoma, leg     Objective:  BP 102/80 (BP Location: Left Arm, Patient Position: Sitting, Cuff Size: Normal)   Pulse 98   Temp 97.7 F (36.5 C) (Temporal)   Resp 18   Ht 5' 2.48 (1.587 m)   Wt 186 lb 3.2 oz (84.5 kg)   SpO2 92%   BMI 33.54 kg/m  Body mass index is 33.54 kg/m. Physical Exam: GEN: alert, well developed HEENT: clear conjunctiva, nose with mild inferior turbinate hypertrophy, pink nasal mucosa, slight clear rhinorrhea, + cobblestoning HEART: regular rate and rhythm, no murmur LUNGS: clear to auscultation bilaterally, no coughing, unlabored respiration SKIN: no rashes or lesions  Assessment:   1. Chronic rhinitis   2. Allergy  to alpha-gal     Plan/Recommendations:  Alpha Gal Syndrome:  - recommend to strictly avoid all mammalian meat. Will plan to recheck labs at next visit. Also discussed that chiropractor/acupuncture cannot cure alpha gal and is not a recommended treatment.  - Initial rxn: rashes, GI symptoms. Avoidance of mammalian meat  improved GI symptoms but the rashes have persisted. We discussed these rashes are not from alpha gal (not hives and not improved with mammalian meat avoidance).  - for SKIN only reaction, okay to take Allegra  180mg  every 12 hours as needed - for SKIN + ANY additional symptoms, OR IF concern for LIFE THREATENING reaction = Epipen  Autoinjector EpiPen  0.3 mg. - If using Epinephrine  autoinjector, call 911 or go to the ER.    Chronic Rhinitis: - Uncontrolled post nasal drainage, restart Flonase .  - SPT 04/2023: positive to none - Use nasal saline rinses before nose sprays such as with Neilmed Sinus Rinse.  Use distilled water.   - Use Flonase  2 sprays each nostril daily.  Aim upward and outward.  - Use Allegra  180mg  daily as needed for runny nose, sneezing, itchy watery eyes.       Return in about 6 months (around 12/06/2024).  Arleta Blanch, MD [facility]       "

## 2024-06-08 NOTE — Patient Instructions (Addendum)
 Alpha Gal Syndrome:  - recommend to strictly avoid all mammalian meat. Okay to eat turkey, seafood, chicken.  - for SKIN only reaction, okay to take Allegra  180mg  every 12 hours as needed - for SKIN + ANY additional symptoms, OR IF concern for LIFE THREATENING reaction = Epipen  Autoinjector EpiPen  0.3 mg. - If using Epinephrine  autoinjector, call 911 or go to the ER.    Chronic Rhinitis: - SPT 04/2023: positive to none - Use nasal saline rinses before nose sprays such as with Neilmed Sinus Rinse.  Use distilled water.   - Use Flonase  2 sprays each nostril daily.  Aim upward and outward.  - Use Allegra  180mg  daily as needed for runny nose, sneezing, itchy watery eyes.

## 2024-08-13 ENCOUNTER — Ambulatory Visit: Admitting: Rheumatology

## 2024-11-27 ENCOUNTER — Other Ambulatory Visit

## 2024-12-04 ENCOUNTER — Inpatient Hospital Stay: Admitting: Oncology

## 2024-12-04 ENCOUNTER — Ambulatory Visit: Admitting: Oncology

## 2024-12-11 ENCOUNTER — Ambulatory Visit: Admitting: Family Medicine
